# Patient Record
Sex: Male | Born: 1970
Health system: Southern US, Community
[De-identification: ages and names within clinical notes are randomized; demographics above are authoritative.]

## PROBLEM LIST (undated history)

## (undated) DIAGNOSIS — F419 Anxiety disorder, unspecified: Secondary | ICD-10-CM

## (undated) DIAGNOSIS — I2510 Atherosclerotic heart disease of native coronary artery without angina pectoris: Secondary | ICD-10-CM

## (undated) DIAGNOSIS — G459 Transient cerebral ischemic attack, unspecified: Secondary | ICD-10-CM

## (undated) DIAGNOSIS — K635 Polyp of colon: Secondary | ICD-10-CM

## (undated) DIAGNOSIS — I5189 Other ill-defined heart diseases: Secondary | ICD-10-CM

## (undated) DIAGNOSIS — I119 Hypertensive heart disease without heart failure: Secondary | ICD-10-CM

## (undated) DIAGNOSIS — I639 Cerebral infarction, unspecified: Secondary | ICD-10-CM

## (undated) DIAGNOSIS — G4730 Sleep apnea, unspecified: Secondary | ICD-10-CM

## (undated) DIAGNOSIS — F32A Depression, unspecified: Secondary | ICD-10-CM

## (undated) DIAGNOSIS — I82409 Acute embolism and thrombosis of unspecified deep veins of unspecified lower extremity: Secondary | ICD-10-CM

## (undated) DIAGNOSIS — I10 Essential (primary) hypertension: Secondary | ICD-10-CM

## (undated) DIAGNOSIS — E785 Hyperlipidemia, unspecified: Secondary | ICD-10-CM

## (undated) DIAGNOSIS — M1990 Unspecified osteoarthritis, unspecified site: Secondary | ICD-10-CM

## (undated) DIAGNOSIS — K219 Gastro-esophageal reflux disease without esophagitis: Secondary | ICD-10-CM

## (undated) HISTORY — PX: NASAL SINUS SURGERY: SHX719

## (undated) HISTORY — DX: Sleep apnea, unspecified: G47.30

## (undated) HISTORY — PX: SPINE SURGERY: SHX786

## (undated) HISTORY — DX: Polyp of colon: K63.5

## (undated) HISTORY — DX: Transient cerebral ischemic attack, unspecified: G45.9

## (undated) HISTORY — DX: Hypertensive heart disease without heart failure: I11.9

## (undated) HISTORY — DX: Hyperlipidemia, unspecified: E78.5

## (undated) HISTORY — DX: Other ill-defined heart diseases: I51.89

## (undated) HISTORY — PX: COLONOSCOPY: SHX174

## (undated) HISTORY — DX: Atherosclerotic heart disease of native coronary artery without angina pectoris: I25.10

## (undated) HISTORY — DX: Unspecified osteoarthritis, unspecified site: M19.90

## (undated) HISTORY — PX: JOINT REPLACEMENT: SHX530

## (undated) HISTORY — DX: Cerebral infarction, unspecified: I63.9

## (undated) HISTORY — DX: Anxiety disorder, unspecified: F41.9

## (undated) HISTORY — DX: Gastro-esophageal reflux disease without esophagitis: K21.9

## (undated) HISTORY — DX: Depression, unspecified: F32.A

---

## 1995-11-03 HISTORY — PX: MENISCUS REPAIR: SHX5179

## 2005-12-30 ENCOUNTER — Ambulatory Visit: Payer: Self-pay | Admitting: Unknown Physician Specialty

## 2008-01-04 ENCOUNTER — Ambulatory Visit: Payer: Self-pay | Admitting: Specialist

## 2008-01-07 ENCOUNTER — Ambulatory Visit: Payer: Self-pay | Admitting: Specialist

## 2009-02-18 HISTORY — PX: REPLACEMENT TOTAL KNEE: SUR1224

## 2009-03-31 ENCOUNTER — Ambulatory Visit: Payer: Self-pay | Admitting: Specialist

## 2009-04-05 ENCOUNTER — Ambulatory Visit: Payer: Self-pay | Admitting: Family Medicine

## 2010-07-09 HISTORY — PX: ARTHROPLASTY: SHX135

## 2011-06-14 DIAGNOSIS — M25569 Pain in unspecified knee: Secondary | ICD-10-CM | POA: Insufficient documentation

## 2011-06-14 DIAGNOSIS — M1991 Primary osteoarthritis, unspecified site: Secondary | ICD-10-CM | POA: Insufficient documentation

## 2011-07-18 DIAGNOSIS — Z9889 Other specified postprocedural states: Secondary | ICD-10-CM | POA: Insufficient documentation

## 2011-10-07 ENCOUNTER — Other Ambulatory Visit: Payer: Self-pay | Admitting: Family Medicine

## 2011-10-07 LAB — CK-MB: CK-MB: 1.8 ng/mL (ref 0.5–3.6)

## 2011-10-07 LAB — TROPONIN I: Troponin-I: <0.02 ng/mL

## 2011-10-09 ENCOUNTER — Ambulatory Visit: Payer: Self-pay | Admitting: Family Medicine

## 2012-04-30 ENCOUNTER — Ambulatory Visit: Payer: Self-pay | Admitting: Family Medicine

## 2012-10-12 ENCOUNTER — Ambulatory Visit: Payer: Self-pay | Admitting: Family Medicine

## 2013-02-18 HISTORY — PX: CERVICAL DISC SURGERY: SHX588

## 2013-04-26 ENCOUNTER — Ambulatory Visit: Payer: Self-pay | Admitting: Family Medicine

## 2013-04-26 LAB — CBC WITH DIFFERENTIAL/PLATELET
BASOS ABS: 0.1 10*3/uL (ref 0.0–0.1)
Basophil %: 1 %
EOS ABS: 0.2 10*3/uL (ref 0.0–0.7)
Eosinophil %: 3 %
HCT: 47.5 % (ref 40.0–52.0)
HGB: 15.6 g/dL (ref 13.0–18.0)
Lymphocyte #: 2.7 10*3/uL (ref 1.0–3.6)
Lymphocyte %: 33.9 %
MCH: 29.9 pg (ref 26.0–34.0)
MCHC: 32.9 g/dL (ref 32.0–36.0)
MCV: 91 fL (ref 80–100)
Monocyte #: 0.5 x10 3/mm (ref 0.2–1.0)
Monocyte %: 6.2 %
Neutrophil #: 4.4 10*3/uL (ref 1.4–6.5)
Neutrophil %: 55.9 %
PLATELETS: 307 10*3/uL (ref 150–440)
RBC: 5.23 10*6/uL (ref 4.40–5.90)
RDW: 13 % (ref 11.5–14.5)
WBC: 7.8 10*3/uL (ref 3.8–10.6)

## 2013-04-26 LAB — COMPREHENSIVE METABOLIC PANEL
AST: 48 U/L — AB (ref 15–37)
Albumin: 4.3 g/dL (ref 3.4–5.0)
Alkaline Phosphatase: 66 U/L
Anion Gap: 4 — ABNORMAL LOW (ref 7–16)
BUN: 15 mg/dL (ref 7–18)
Bilirubin,Total: 0.5 mg/dL (ref 0.2–1.0)
CO2: 28 mmol/L (ref 21–32)
CREATININE: 0.91 mg/dL (ref 0.60–1.30)
Calcium, Total: 9 mg/dL (ref 8.5–10.1)
Chloride: 104 mmol/L (ref 98–107)
EGFR (African American): >60
Glucose: 97 mg/dL (ref 65–99)
OSMOLALITY: 273 (ref 275–301)
Potassium: 4.2 mmol/L (ref 3.5–5.1)
SGPT (ALT): 66 U/L (ref 12–78)
Sodium: 136 mmol/L (ref 136–145)
Total Protein: 8.1 g/dL (ref 6.4–8.2)

## 2013-04-26 LAB — CK: CK, Total: 535 U/L — ABNORMAL HIGH

## 2013-04-26 LAB — SEDIMENTATION RATE: ERYTHROCYTE SED RATE: 2 mm/h (ref 0–15)

## 2013-08-03 ENCOUNTER — Other Ambulatory Visit: Payer: Self-pay | Admitting: Family Medicine

## 2013-08-03 LAB — CBC WITH DIFFERENTIAL/PLATELET
Basophil #: 0.1 10*3/uL (ref 0.0–0.1)
Basophil %: 0.9 %
EOS ABS: 0.2 10*3/uL (ref 0.0–0.7)
Eosinophil %: 2.5 %
HCT: 45.1 % (ref 40.0–52.0)
HGB: 15.2 g/dL (ref 13.0–18.0)
Lymphocyte #: 2.7 10*3/uL (ref 1.0–3.6)
Lymphocyte %: 33.9 %
MCH: 30.6 pg (ref 26.0–34.0)
MCHC: 33.7 g/dL (ref 32.0–36.0)
MCV: 91 fL (ref 80–100)
Monocyte #: 0.5 x10 3/mm (ref 0.2–1.0)
Monocyte %: 6.4 %
Neutrophil #: 4.5 10*3/uL (ref 1.4–6.5)
Neutrophil %: 56.3 %
Platelet: 320 10*3/uL (ref 150–440)
RBC: 4.96 10*6/uL (ref 4.40–5.90)
RDW: 12.9 % (ref 11.5–14.5)
WBC: 8 10*3/uL (ref 3.8–10.6)

## 2013-08-03 LAB — D-DIMER(ARMC): D-DIMER: 252 ng/mL

## 2013-08-03 LAB — TROPONIN I: Troponin-I: <0.02 ng/mL

## 2013-12-24 ENCOUNTER — Ambulatory Visit: Payer: Self-pay | Admitting: Family Medicine

## 2014-03-14 ENCOUNTER — Ambulatory Visit: Payer: Self-pay | Admitting: Family Medicine

## 2014-03-24 DIAGNOSIS — E782 Mixed hyperlipidemia: Secondary | ICD-10-CM | POA: Insufficient documentation

## 2014-03-24 DIAGNOSIS — R0681 Apnea, not elsewhere classified: Secondary | ICD-10-CM | POA: Insufficient documentation

## 2014-04-07 LAB — HM COLONOSCOPY

## 2014-04-08 ENCOUNTER — Ambulatory Visit: Payer: Self-pay | Admitting: Gastroenterology

## 2014-07-23 ENCOUNTER — Encounter (HOSPITAL_COMMUNITY): Payer: Self-pay | Admitting: Cardiology

## 2014-07-23 ENCOUNTER — Emergency Department: Payer: BLUE CROSS/BLUE SHIELD

## 2014-07-23 ENCOUNTER — Emergency Department
Admission: EM | Admit: 2014-07-23 | Discharge: 2014-07-23 | Disposition: A | Discharge status: Other Acute Inpatient Hospital | Payer: BLUE CROSS/BLUE SHIELD | Attending: Emergency Medicine | Admitting: Emergency Medicine

## 2014-07-23 ENCOUNTER — Encounter (HOSPITAL_COMMUNITY)
Admission: EM | Disposition: A | Discharge status: Home / Self Care | Payer: BLUE CROSS/BLUE SHIELD | Source: Other Acute Inpatient Hospital | Attending: Cardiovascular Disease

## 2014-07-23 ENCOUNTER — Inpatient Hospital Stay (HOSPITAL_COMMUNITY)
Admission: EM | Admit: 2014-07-23 | Discharge: 2014-07-26 | DRG: 251 | Disposition: A | Discharge status: Home / Self Care | Payer: BLUE CROSS/BLUE SHIELD | Source: Other Acute Inpatient Hospital | Attending: Cardiovascular Disease | Admitting: Cardiovascular Disease

## 2014-07-23 ENCOUNTER — Encounter: Payer: Self-pay | Admitting: Emergency Medicine

## 2014-07-23 DIAGNOSIS — Z6835 Body mass index (BMI) 35.0-35.9, adult: Secondary | ICD-10-CM

## 2014-07-23 DIAGNOSIS — Z86718 Personal history of other venous thrombosis and embolism: Secondary | ICD-10-CM | POA: Diagnosis not present

## 2014-07-23 DIAGNOSIS — G459 Transient cerebral ischemic attack, unspecified: Secondary | ICD-10-CM | POA: Insufficient documentation

## 2014-07-23 DIAGNOSIS — I213 ST elevation (STEMI) myocardial infarction of unspecified site: Secondary | ICD-10-CM | POA: Insufficient documentation

## 2014-07-23 DIAGNOSIS — I2111 ST elevation (STEMI) myocardial infarction involving right coronary artery: Secondary | ICD-10-CM

## 2014-07-23 DIAGNOSIS — I249 Acute ischemic heart disease, unspecified: Secondary | ICD-10-CM | POA: Diagnosis present

## 2014-07-23 DIAGNOSIS — I10 Essential (primary) hypertension: Secondary | ICD-10-CM | POA: Diagnosis present

## 2014-07-23 DIAGNOSIS — R0789 Other chest pain: Secondary | ICD-10-CM | POA: Diagnosis present

## 2014-07-23 DIAGNOSIS — I2510 Atherosclerotic heart disease of native coronary artery without angina pectoris: Secondary | ICD-10-CM | POA: Diagnosis present

## 2014-07-23 DIAGNOSIS — Z87891 Personal history of nicotine dependence: Secondary | ICD-10-CM

## 2014-07-23 DIAGNOSIS — I2119 ST elevation (STEMI) myocardial infarction involving other coronary artery of inferior wall: Secondary | ICD-10-CM | POA: Diagnosis present

## 2014-07-23 DIAGNOSIS — E669 Obesity, unspecified: Secondary | ICD-10-CM | POA: Diagnosis present

## 2014-07-23 DIAGNOSIS — E785 Hyperlipidemia, unspecified: Secondary | ICD-10-CM | POA: Diagnosis present

## 2014-07-23 DIAGNOSIS — R079 Chest pain, unspecified: Secondary | ICD-10-CM | POA: Diagnosis present

## 2014-07-23 HISTORY — PX: CARDIAC CATHETERIZATION: SHX172

## 2014-07-23 HISTORY — DX: Transient cerebral ischemic attack, unspecified: G45.9

## 2014-07-23 HISTORY — DX: Acute embolism and thrombosis of unspecified deep veins of unspecified lower extremity: I82.409

## 2014-07-23 HISTORY — DX: ST elevation (STEMI) myocardial infarction involving right coronary artery: I21.11

## 2014-07-23 LAB — BASIC METABOLIC PANEL
Anion gap: 8 (ref 5–15)
BUN: 19 mg/dL (ref 6–20)
CO2: 25 mmol/L (ref 22–32)
CREATININE: 1.36 mg/dL — AB (ref 0.61–1.24)
Calcium: 8.7 mg/dL — ABNORMAL LOW (ref 8.9–10.3)
Chloride: 107 mmol/L (ref 101–111)
GFR calc Af Amer: >60 mL/min (ref >60)
GFR calc non Af Amer: >60 mL/min (ref >60)
Glucose, Bld: 148 mg/dL — ABNORMAL HIGH (ref 65–99)
Potassium: 4.2 mmol/L (ref 3.5–5.1)
SODIUM: 140 mmol/L (ref 135–145)

## 2014-07-23 LAB — PROTIME-INR
INR: 1.04
PROTHROMBIN TIME: 13.8 s (ref 11.4–15.0)

## 2014-07-23 LAB — CBC
HCT: 42.3 % (ref 40.0–52.0)
HCT: 38.8 % — ABNORMAL LOW (ref 39.0–52.0)
HEMOGLOBIN: 13.3 g/dL (ref 13.0–17.0)
Hemoglobin: 13.9 g/dL (ref 13.0–18.0)
MCH: 29.7 pg (ref 26.0–34.0)
MCH: 30.1 pg (ref 26.0–34.0)
MCHC: 32.9 g/dL (ref 32.0–36.0)
MCHC: 34.3 g/dL (ref 30.0–36.0)
MCV: 90.3 fL (ref 80.0–100.0)
MCV: 87.8 fL (ref 78.0–100.0)
PLATELETS: 294 10*3/uL (ref 150–440)
Platelets: 293 10*3/uL (ref 150–400)
RBC: 4.68 MIL/uL (ref 4.40–5.90)
RBC: 4.42 MIL/uL (ref 4.22–5.81)
RDW: 13 % (ref 11.5–14.5)
RDW: 12.7 % (ref 11.5–15.5)
WBC: 16.3 10*3/uL — AB (ref 3.8–10.6)
WBC: 14.2 10*3/uL — ABNORMAL HIGH (ref 4.0–10.5)

## 2014-07-23 LAB — TROPONIN I
Troponin I: 0.06 ng/mL — ABNORMAL HIGH (ref <0.031)
Troponin I: 2.38 ng/mL (ref <0.031)

## 2014-07-23 LAB — MRSA PCR SCREENING: MRSA BY PCR: NEGATIVE

## 2014-07-23 LAB — POCT ACTIVATED CLOTTING TIME: ACTIVATED CLOTTING TIME: 104 s

## 2014-07-23 SURGERY — LEFT HEART CATH
Anesthesia: LOCAL

## 2014-07-23 MED ORDER — IOHEXOL 350 MG/ML SOLN
INTRAVENOUS | Status: DC | PRN
  Administered 2014-07-23: 200 mL via INTRA_ARTERIAL

## 2014-07-23 MED ORDER — NITROGLYCERIN 1 MG/10 ML FOR IR/CATH LAB
INTRA_ARTERIAL | Status: AC
  Filled 2014-07-23: qty 10

## 2014-07-23 MED ORDER — ONDANSETRON HCL 4 MG/2ML IJ SOLN: 4.0000 mg | Freq: Four times a day (QID) | INTRAMUSCULAR | Status: DC | PRN

## 2014-07-23 MED ORDER — TICAGRELOR 90 MG PO TABS
ORAL_TABLET | ORAL | Status: AC
  Filled 2014-07-23: qty 1

## 2014-07-23 MED ORDER — SODIUM CHLORIDE 0.9 % IV SOLN: 250.0000 mL | INTRAVENOUS | Status: DC | PRN

## 2014-07-23 MED ORDER — TIROFIBAN HCL IV 5 MG/100ML
0.1500 ug/kg/min | INTRAVENOUS | Status: AC
  Administered 2014-07-23 – 2014-07-24 (×4): 0.15 ug/kg/min via INTRAVENOUS
  Filled 2014-07-23 (×4): qty 100

## 2014-07-23 MED ORDER — ATORVASTATIN CALCIUM 80 MG PO TABS
80.0000 mg | ORAL_TABLET | Freq: Every day | ORAL | Status: DC
  Filled 2014-07-23: qty 1

## 2014-07-23 MED ORDER — METOPROLOL TARTRATE 12.5 MG HALF TABLET
12.5000 mg | ORAL_TABLET | Freq: Two times a day (BID) | ORAL | Status: DC
  Administered 2014-07-23 – 2014-07-26 (×6): 12.5 mg via ORAL
  Filled 2014-07-23 (×7): qty 1

## 2014-07-23 MED ORDER — ASPIRIN EC 81 MG PO TBEC
81.0000 mg | DELAYED_RELEASE_TABLET | Freq: Every day | ORAL | Status: DC
  Administered 2014-07-24 – 2014-07-26 (×3): 81 mg via ORAL
  Filled 2014-07-23 (×3): qty 1

## 2014-07-23 MED ORDER — HEPARIN (PORCINE) IN NACL 2-0.9 UNIT/ML-% IJ SOLN
INTRAMUSCULAR | Status: AC
  Filled 2014-07-23: qty 1000

## 2014-07-23 MED ORDER — TICAGRELOR 90 MG PO TABS
ORAL_TABLET | ORAL | Status: DC | PRN
  Administered 2014-07-23: 180 mg via ORAL

## 2014-07-23 MED ORDER — NITROGLYCERIN 1 MG/10 ML FOR IR/CATH LAB
INTRA_ARTERIAL | Status: DC | PRN
  Administered 2014-07-23 (×3): 200 ug via INTRACORONARY

## 2014-07-23 MED ORDER — LIDOCAINE HCL (PF) 1 % IJ SOLN
INTRAMUSCULAR | Status: AC
  Filled 2014-07-23: qty 30

## 2014-07-23 MED ORDER — SODIUM CHLORIDE 0.9 % IV SOLN
INTRAVENOUS | Status: AC
  Administered 2014-07-23 (×2): via INTRAVENOUS

## 2014-07-23 MED ORDER — MIDAZOLAM HCL 2 MG/2ML IJ SOLN
INTRAMUSCULAR | Status: AC
  Filled 2014-07-23: qty 2

## 2014-07-23 MED ORDER — SODIUM CHLORIDE 0.9 % IJ SOLN
3.0000 mL | Freq: Two times a day (BID) | INTRAMUSCULAR | Status: DC
  Administered 2014-07-24 – 2014-07-26 (×4): 3 mL via INTRAVENOUS

## 2014-07-23 MED ORDER — ASPIRIN EC 81 MG PO TBEC
81.0000 mg | DELAYED_RELEASE_TABLET | Freq: Every day | ORAL | Status: DC
  Filled 2014-07-23: qty 1

## 2014-07-23 MED ORDER — SODIUM CHLORIDE 0.9 % IJ SOLN: 3.0000 mL | INTRAMUSCULAR | Status: DC | PRN

## 2014-07-23 MED ORDER — ATORVASTATIN CALCIUM 80 MG PO TABS
80.0000 mg | ORAL_TABLET | Freq: Every day | ORAL | Status: DC
  Administered 2014-07-24 – 2014-07-25 (×2): 80 mg via ORAL
  Filled 2014-07-23 (×3): qty 1

## 2014-07-23 MED ORDER — MIDAZOLAM HCL 2 MG/2ML IJ SOLN
INTRAMUSCULAR | Status: DC | PRN
  Administered 2014-07-23: 2 mg via INTRAVENOUS

## 2014-07-23 MED ORDER — BIVALIRUDIN 250 MG IV SOLR
250.0000 mg | INTRAVENOUS | Status: DC | PRN
  Administered 2014-07-23: 1.75 mg/kg/h via INTRAVENOUS

## 2014-07-23 MED ORDER — ATROPINE SULFATE 0.1 MG/ML IJ SOLN
INTRAMUSCULAR | Status: AC
  Filled 2014-07-23: qty 10

## 2014-07-23 MED ORDER — TICAGRELOR 90 MG PO TABS
90.0000 mg | ORAL_TABLET | Freq: Two times a day (BID) | ORAL | Status: DC
  Administered 2014-07-24 – 2014-07-26 (×5): 90 mg via ORAL
  Filled 2014-07-23 (×7): qty 1

## 2014-07-23 MED ORDER — BIVALIRUDIN BOLUS VIA INFUSION - CUPID
INTRAVENOUS | Status: DC | PRN
  Administered 2014-07-23: 86.775 mg via INTRAVENOUS

## 2014-07-23 MED ORDER — TIROFIBAN HCL IV 12.5 MG/250 ML
INTRAVENOUS | Status: AC
  Filled 2014-07-23: qty 250

## 2014-07-23 MED ORDER — TIROFIBAN HCL IV 12.5 MG/250 ML
INTRAVENOUS | Status: DC | PRN
  Administered 2014-07-23: 25 ug/kg/min via INTRAVENOUS

## 2014-07-23 MED ORDER — BIVALIRUDIN 250 MG IV SOLR
INTRAVENOUS | Status: AC
  Filled 2014-07-23: qty 250

## 2014-07-23 MED ORDER — SODIUM CHLORIDE 0.9 % IV SOLN: 0.2500 mg/kg/h | INTRAVENOUS | Status: AC

## 2014-07-23 MED ORDER — SODIUM CHLORIDE 0.9 % IV SOLN
0.2500 mg/kg/h | INTRAVENOUS | Status: DC
  Filled 2014-07-23: qty 250

## 2014-07-23 MED ORDER — NITROGLYCERIN 0.4 MG SL SUBL
SUBLINGUAL_TABLET | SUBLINGUAL | Status: AC
  Filled 2014-07-23: qty 3

## 2014-07-23 MED ORDER — FENTANYL CITRATE (PF) 100 MCG/2ML IJ SOLN
INTRAMUSCULAR | Status: DC | PRN
  Administered 2014-07-23 (×2): 25 ug via INTRAVENOUS

## 2014-07-23 MED ORDER — SODIUM CHLORIDE 0.9 % IV SOLN
1.7500 mg/kg/h | INTRAVENOUS | Status: DC
  Filled 2014-07-23: qty 250

## 2014-07-23 MED ORDER — FENTANYL CITRATE (PF) 100 MCG/2ML IJ SOLN
INTRAMUSCULAR | Status: AC
  Filled 2014-07-23: qty 2

## 2014-07-23 MED ORDER — NITROGLYCERIN 0.4 MG SL SUBL: 0.4000 mg | SUBLINGUAL_TABLET | SUBLINGUAL | Status: DC | PRN

## 2014-07-23 MED ORDER — ACETAMINOPHEN 325 MG PO TABS
650.0000 mg | ORAL_TABLET | ORAL | Status: DC | PRN
  Administered 2014-07-23: 650 mg via ORAL
  Filled 2014-07-23: qty 2

## 2014-07-23 MED ORDER — NITROGLYCERIN IN D5W 200-5 MCG/ML-% IV SOLN
INTRAVENOUS | Status: AC
  Filled 2014-07-23: qty 250

## 2014-07-23 SURGICAL SUPPLY — 22 items
BALLN EMERGE MR 3.5X12 (BALLOONS) ×3
BALLN TREK RX 2.5X12 (BALLOONS) ×3
BALLOON EMERGE MR 3.5X12 (BALLOONS) ×1 IMPLANT
BALLOON TREK RX 2.5X12 (BALLOONS) ×1 IMPLANT
CATH EXTRAC PRONTO 5.5F 138CM (CATHETERS) ×3 IMPLANT
CATH INFINITI 5FR ANG PIGTAIL (CATHETERS) IMPLANT
CATH INFINITI 5FR MULTPACK ANG (CATHETERS) ×3 IMPLANT
CATH OPTITORQUE TIG 4.0 5F (CATHETERS) IMPLANT
DEVICE RAD COMP TR BAND LRG (VASCULAR PRODUCTS) IMPLANT
GLIDESHEATH SLEND A-KIT 6F 22G (SHEATH) IMPLANT
GUIDE CATH RUNWAY 6FR FR4 (CATHETERS) ×3 IMPLANT
KIT ENCORE 26 ADVANTAGE (KITS) ×3 IMPLANT
KIT HEART LEFT (KITS) ×3 IMPLANT
PACK CARDIAC CATHETERIZATION (CUSTOM PROCEDURE TRAY) ×3 IMPLANT
SHEATH PINNACLE 5F 10CM (SHEATH) ×3 IMPLANT
SHEATH PINNACLE 6F 10CM (SHEATH) ×3 IMPLANT
SYR MEDRAD MARK V 150ML (SYRINGE) ×3 IMPLANT
TRANSDUCER W/STOPCOCK (MISCELLANEOUS) ×3 IMPLANT
TUBING CIL FLEX 10 FLL-RA (TUBING) ×3 IMPLANT
WIRE ASAHI PROWATER 180CM (WIRE) ×3 IMPLANT
WIRE EMERALD 3MM-J .035X150CM (WIRE) ×3 IMPLANT
WIRE SAFE-T 1.5MM-J .035X260CM (WIRE) IMPLANT

## 2014-07-23 NOTE — ED Provider Notes (Signed)
[facility] Emergency Department Provider Note  ____________________________________________  Time seen: Approximately 0865 PM  I have reviewed the triage vital signs and the nursing notes.   HISTORY  Chief Complaint Chest Pain    HPI Eduardo Poole is a 44 y.o. male who had an episode of chest pain which started while pressure washing his home. Occurred at about 1:30 or 2 PM. Patient states he was pressure washing out and developed a sudden onset of fairly severe pain in his middle chest which is described as a pressure sensation that radiated down into his right arm. He was very nauseated at that time. He went into the house and sat down to use the toilet.He was getting up from the toilet when he also noticed that he felt a little weak in his right leg and his right arm was weak and numb. He said he may have felt a little bit funny with his speech, but the numbness weakness and speech symptoms all resolved within 10-15 minutes. He is still having ongoing chest pain.  The patient was given aspirin 324 with EMS, nitroglycerin sublingual, and nitroglycerin paste which made his pain significantly improved down to about a "2" in the left chest.  He denies having any history of known stroke or heart disease.  He did not faint or pass out.  History reviewed. No pertinent past medical history.  There are no active problems to display for this patient.   Past Surgical History  Procedure Laterality Date  . Joint replacement      No current outpatient prescriptions on file.  Allergies Review of patient's allergies indicates no known allergies.  History reviewed. No pertinent family history.  Social History History  Substance Use Topics  . Smoking status: Never Smoker   . Smokeless tobacco: Former Systems developer  . Alcohol Use: No    Review of Systems Constitutional: No fever/chills Eyes: No visual changes. ENT: No sore throat. Cardiovascular: See history of  present illness Respiratory: Denies shortness of breath. Gastrointestinal: No abdominal pain.  Nausea. No diarrhea.  No constipation. Genitourinary: Negative for dysuria. Musculoskeletal: Negative for back pain. Skin: Negative for rash. Neurological: Negative for headaches, focal weakness or numbness at this time, though his right arm and right leg were weak for about 10 minutes.  10-point ROS otherwise negative.  ____________________________________________   PHYSICAL EXAM:  VITAL SIGNS: ED Triage Vitals  Enc Vitals Group     BP 07/23/14 1459 144/96 mmHg     Pulse Rate 07/23/14 1459 101     Resp --      Temp 07/23/14 1459 99 F (37.2 C)     Temp Source 07/23/14 1459 Oral     SpO2 07/23/14 1459 95 %     Weight 07/23/14 1459 255 lb (115.667 kg)     Height 07/23/14 1459 5\' 10"  (1.778 m)     Head Cir --      Peak Flow --      Pain Score 07/23/14 1501 2     Pain Loc --      Pain Edu? --      Excl. in Downs? --     Constitutional: Alert and oriented. Pale and diaphoretic. Eyes: Conjunctivae are normal. PERRL. EOMI. Head: Atraumatic. Nose: No congestion/rhinnorhea. Mouth/Throat: Mucous membranes are moist.  Oropharynx non-erythematous. Neck: No stridor.   Cardiovascular: Normal rate, regular rhythm. Grossly normal heart sounds.  Good peripheral circulation. Respiratory: Normal respiratory effort.  No retractions. Lungs CTAB. Gastrointestinal: Soft and  nontender. No distention. No abdominal bruits. No CVA tenderness. Musculoskeletal: No lower extremity tenderness nor edema.  No joint effusions. Neurologic:  Normal speech and language. No gross focal neurologic deficits are appreciated. Speech is normal. No gait instability. Patient has normal speech. There is no pronator drift. There is no cranial nerve deficit. He is able to lift and move all extremities without weakness. Skin:  Skin is warm, dry and intact. No rash noted. Psychiatric: Mood and affect are normal. Speech and  behavior are normal.  ____________________________________________   LABS (all labs ordered are listed, but only abnormal results are displayed)  Labs Reviewed  CBC - Abnormal; Notable for the following:    WBC 16.3 (*)    All other components within normal limits  PROTIME-INR  BASIC METABOLIC PANEL  TROPONIN I   ____________________________________________  EKG  EKG at 1503 hrs. reviewed and interpreted by me There is approximately 1 mm of ST elevation in leads 2 and leads aVF. There is possible slight ST elevation in lead 3. Sinus rhythm. Ventricular rate 96. QRS 96 QTc 462. This EKG is concerning for acute ST elevation MI. ____________________________________________  RADIOLOGY  CLINICAL DATA: Chest pain. Right-sided weakness.  EXAM: CT HEAD WITHOUT CONTRAST  TECHNIQUE: Contiguous axial images were obtained from the base of the skull through the vertex without intravenous contrast.  COMPARISON: 04/30/2012  FINDINGS: No mass lesion. No midline shift. No acute hemorrhage or hematoma. No extra-axial fluid collections. No evidence of acute infarction. Brain parenchyma is normal. Slight asymmetry of the lateral ventricles is felt to be congenital and stable. No osseous abnormality.  IMPRESSION: Normal CT scan. ____________________________________________   PROCEDURES  Procedure(s) performed: None  Critical Care performed: Yes, see critical care note(s)  CRITICAL CARE Performed by: Delman Kitten   Total critical care time: 40  Critical care time was exclusive of separately billable procedures and treating other patients.  Critical care was necessary to treat or prevent imminent or life-threatening deterioration.  Critical care was time spent personally by me on the following activities: development of treatment plan with patient and/or surrogate as well as nursing, discussions with consultants, evaluation of patient's response to treatment, examination of  patient, obtaining history from patient or surrogate, ordering and performing treatments and interventions, ordering and review of laboratory studies, ordering and review of radiographic studies, pulse oximetry and re-evaluation of patient's condition.  ____________________________________________   INITIAL IMPRESSION / ASSESSMENT AND PLAN / ED COURSE  Pertinent labs & imaging results that were available during my care of the patient were reviewed by me and considered in my medical decision making (see chart for details).  His patient's presentation is primarily concerning for possible acute cardiac event including STEMI. In addition, the patient did have a brief episode of weakness in the right arm and right leg with speech changes. I am concerned that this represents an acute cardiac event. I have discussed with acute interventional cardiology we will transfer the patient for emergent evaluation at [facility] catheter lab. However, the patient also had acute neurologic event that lasted less than 15 minutes which is concerning for possible transient ischemic event or intracranial hemorrhage. I also discussed this with Dr. Claiborne Billings, because of this we did delay care and obtain a CT head and thereafter initiated transfer to [facility] while awaiting read on the CT. I discussed with Dr. Claiborne Billings, and based on the time acuity and need for possible acute interventional cardiology evaluation, we did not start heparin bolus as we want  to assure that the CT does not show any intracranial bleeding prior to any anticoagulation.  Discussed with the patient and his family and he understands and agrees with the recommendation to transfer to [facility] for further evaluation. Risks of transfer were discussed with the patient.  There is unclear if this could have an associated cerebrovascular accident, patient's symptoms would have markedly improved based on the fact that he had significant difficulty moving his arm  leg and speech change. He presently has no speech changes and states that he is moving his arms and legs well, and indeed on exam he appears neurologically intact . I feel that this would likely not make him a TPA candidate in regards to strokelike symptoms, but he may still need interventional cardiology given his EKG abnormalities.  At this point, we still do not know for sure that the patient did not have some type of a complicated or mixed picture of acute coronary syndrome and/or CVA, but his neurologic symptoms have  resolved now CT head has been completed, and he will be under the care of interventional cardiology based on his EKG, ongoing chest pain, and concerns for ACS.  ----------------------------------------- 3:48 PM on 07/23/2014 -----------------------------------------  And to drink to await CT head result. I have called to gr for radiology to follow-up.   CT head is read as normal. I discussed labs and updated Dr. Claiborne Billings at 4 PM. We also discussed differential, that this could represent acute coronary syndrome, less likely acute cerebrovascular accident, or other etiology such as dissection. ____________________________________________   FINAL CLINICAL IMPRESSION(S) / ED DIAGNOSES  Final diagnoses:  ST elevation myocardial infarction (STEMI), unspecified artery  Transient cerebral ischemia, unspecified transient cerebral ischemia type      Delman Kitten, MD 07/23/14 1606

## 2014-07-23 NOTE — H&P (Signed)
Patient ID: ALFORD GAMERO MRN: [id number], DOB/AGE: January 16, 1971   Admit date: 07/23/2014   Primary Physician: Lelon Huh, MD Primary Cardiologist: New (Dr. Claiborne Billings)  Pt. Profile:  44 y/o male with no prior cardiac history admitted for ACS and TIA symptoms.  Problem List  No past medical history on file.  Past Surgical History  Procedure Laterality Date  . Joint replacement       Allergies  No Known Allergies  HPI  The patient is a 44 y/o male with no prior cardiac history who was transferred today from [facility] for ACS. He denies any past history of HTN, HLD or DM. He has a h/o DVT 8 years ago and was treated with Coumadin.  He has a remote history of smokeless tobacco use. He was in his usual state of health until around 1PM today when he developed severe substernal chest pressure while pressure-washing his house. The pain did not go away with rest, prompting him to go to [place]. There, EKG showed mild inferior ST elevations. Troponin was mildly elevated at 0.06. Also while in the ER, he was noted to have transient TIA like symptoms with brief slurring of speech and unilateral upper extremity numbness/ weakness. This only lasted several min. STAT CT of head showed no acute CVA. He was transported urgently to [facility] for further care and emergent [facility]. On arrival to [facility], he was still with chest discomfort. No further neurological deficits. Visualization of the left coronary system showed widely patent vessels. The RCA appeared to have ~30% mid stenosis with good flow but with evidence of clot distally. It was felt that perhaps he had palque that had rupture and that his clot had resolved after dose of Angiomax. There was also question of possible embolization of clot to cerebral vasculature and that he suffered a possible TIA. Left ventriculography showed normal LVF and normal appearing aortic anatomy. He was continued on Angiomax and started on ASA and Effient. He is now CP free.     Home Medications  Prior to Admission medications   Not on File    Family History  Family History  Problem Relation Age of Onset  . Hypertension Mother     Social History  History   Social History  . Marital Status: Married    Spouse Name: N/A  . Number of Children: N/A  . Years of Education: N/A   Occupational History  . Not on file.   Social History Main Topics  . Smoking status: Never Smoker   . Smokeless tobacco: Former Systems developer  . Alcohol Use: No  . Drug Use: No  . Sexual Activity: Not on file   Other Topics Concern  . Not on file   Social History Narrative     Review of Systems General:  No chills, fever, night sweats or weight changes.  Cardiovascular:  No chest pain, dyspnea on exertion, edema, orthopnea, palpitations, paroxysmal nocturnal dyspnea. Dermatological: No rash, lesions/masses Respiratory: No cough, dyspnea Urologic: No hematuria, dysuria Abdominal:   No nausea, vomiting, diarrhea, bright red blood per rectum, melena, or hematemesis Neurologic:  No visual changes, wkns, changes in mental status. All other systems reviewed and are otherwise negative except as noted above.  Physical Exam  Blood pressure 135/91, pulse 0, resp. rate 0, SpO2 0 %.  General: Pleasant, NAD Psych: Normal affect. Neuro: Alert and oriented X 3. Moves all extremities spontaneously. HEENT: Normal  Neck: Supple without bruits or JVD. Lungs:  Resp regular and unlabored,  CTA. Heart: RRR no s3, s4, or murmurs. Abdomen: Soft, non-tender, non-distended, BS + x 4.  Extremities: No clubbing, cyanosis or edema. DP/PT/Radials 2+ and equal bilaterally.  Labs  Troponin (Point of Care Test) No results for input(s): TROPIPOC in the last 72 hours.  Recent Labs  07/23/14 1501  TROPONINI 0.06*   Lab Results  Component Value Date   WBC 16.3* 07/23/2014   HGB 13.9 07/23/2014   HCT 42.3 07/23/2014   MCV 90.3 07/23/2014   PLT 294 07/23/2014     Recent Labs Lab  07/23/14 1501  NA 140  K 4.2  CL 107  CO2 25  BUN 19  CREATININE 1.36*  CALCIUM 8.7*  GLUCOSE 148*   No results found for: CHOL, HDL, LDLCALC, TRIG No results found for: DDIMER   Radiology/Studies  Ct Head Wo Contrast  07/23/2014   CLINICAL DATA:  Chest pain.  Right-sided weakness.  EXAM: CT HEAD WITHOUT CONTRAST  TECHNIQUE: Contiguous axial images were obtained from the base of the skull through the vertex without intravenous contrast.  COMPARISON:  04/30/2012  FINDINGS: No mass lesion. No midline shift. No acute hemorrhage or hematoma. No extra-axial fluid collections. No evidence of acute infarction. Brain parenchyma is normal. Slight asymmetry of the lateral ventricles is felt to be congenital and stable. No osseous abnormality.  IMPRESSION: Normal CT scan.   Electronically Signed   By: Lorriane Shire M.D.   On: 07/23/2014 15:54    ECG  approximately 1 mm of ST elevation in leads 2 and leads aVF. There is possible slight ST elevation in lead 3. Sinus rhythm. Ventricular rate 96. QRS 96 QTc 462. This EKG is concerning for acute ST elevation MI.    ASSESSMENT AND PLAN  Active Problems:   ST elevation myocardial infarction involving right coronary artery  1. ACS: LHC suggest likely plaque rupture in RCA with resolution of clot after administration of Angiomax. No flow limiting lesions and no indication for PCI. The left coronary system is widely patent.  LVF is normal. Will admit to the CCU. Will continue Angiomax and Aggrastat. Will also start ASA, Effient, low dose BB and high dose statin. Will check FLP in the am. Will also check Hgb A1c. Monitor on telemetry.  2. ? TIA: There is concern for possible embolization of clot that may explain his TIA like symptoms earlier. He is now asymptomatic. Will check a 2D echo to look for cardiac sources.   Signed, Lyda Jester, PA-C 07/23/2014, 5:38 PM  Patient seen and examined. Agree with assessment and plan.  Mr. Jorgensen is a  44 year old white male who was transferred from [facility] emergency room with a diagnosis of inferior STEMI.  He denies any known prior cardiac disease.  Today while power washing his house, he developed chest tightness and pressure.  He went into the house.  His chest pain persisted.  He also developed some transient speech impediment and right-sided weakness.  In the emergency room at [facility],  ECG showed mild inferior ST elevation.  A subsequent ECG showed progression.  The patient continued to experience chest tightness.  A CT scan was done which did not show any acute bleed or stroke.  He is now brought directly to the catheterization laboratory for emergent cardiac catheterization.  The patient has remote history of a prior DVT 8 years ago after he developed some injury to his calf muscle.  There is remote history of smokeless tobacco but he quit over 10 years ago.  Upon arrival to the catheterization laboratory.  He is still experiencing 5 out of 10 chest pain.  Troponin is mildly positive.  Plan emergent cardiac catheterization and possible intervention if necessary.   Troy Sine, MD, [facility] 07/23/2014 6:05 PM

## 2014-07-23 NOTE — Progress Notes (Signed)
ANTICOAGULATION CONSULT NOTE - Follow Up Consult  Pharmacy Consult for Aggrastat Indication: chest pain/ACS  No Known Allergies  Patient Measurements:    Weight 115.7 kg  Vital Signs: Temp: 99 F (37.2 C) (06/04 1459) Temp Source: Oral (06/04 1459) BP: 135/91 mmHg (06/04 1707) Pulse Rate: 0 (06/04 1737)  Labs:  Recent Labs  07/23/14 1501  HGB 13.9  HCT 42.3  PLT 294  LABPROT 13.8  INR 1.04  CREATININE 1.36*  TROPONINI 0.06*    Estimated Creatinine Clearance: 88.3 mL/min (by C-G formula based on Cr of 1.36).   Medications:  Infusions:  . sodium chloride    . tirofiban      Assessment: 44 year old male admitted with STEMI s/p cath.  He is to continue Aggrastat x 18 hours post-cath.  His baseline platelet count is 294 and no elevated risk of bleeding is noted.  Goal of Therapy:  Monitor platelets by anticoagulation protocol: Yes   Plan:  Aggrastat 0.15 mcg/kg/min x 18 hours. Check CBC in 6 hours to assess platelet count Daily CBC while on Aggrastat Monitor for bleeding complications.  Legrand Como, Pharm.D., BCPS, AAHIVP Clinical Pharmacist Phone: [phone] or [phone] 07/23/2014, 5:53 PM

## 2014-07-23 NOTE — ED Notes (Signed)
Pt to ct - pt had 4 baby asa pta with 3 nitro which brought pain down to 2/10

## 2014-07-23 NOTE — ED Notes (Signed)
Was power washing house x 4 hrs and began to have chest pain radiating to rt arm and nausea

## 2014-07-23 NOTE — ED Notes (Signed)
All paperwork printed and transfer forms signed. Pt able to move all extremities but states it feels weak in his right hand

## 2014-07-23 NOTE — Progress Notes (Signed)
Act 104. Pulled arterial sheath with Lurline Hare. Held pressure for 20 minutes, pressure dressing applied. No bruising or hematoma developed. Pt educated on post sheath removal precautions. Will continue to monitor.

## 2014-07-23 NOTE — ED Notes (Signed)
Attempted to call report to cath lab at [phone] - no answer.

## 2014-07-24 ENCOUNTER — Encounter (HOSPITAL_COMMUNITY): Payer: Self-pay

## 2014-07-24 DIAGNOSIS — E669 Obesity, unspecified: Secondary | ICD-10-CM

## 2014-07-24 DIAGNOSIS — E785 Hyperlipidemia, unspecified: Secondary | ICD-10-CM

## 2014-07-24 LAB — BASIC METABOLIC PANEL
Anion gap: 9 (ref 5–15)
Anion gap: 6 (ref 5–15)
BUN: 11 mg/dL (ref 6–20)
BUN: 10 mg/dL (ref 6–20)
CALCIUM: 8.5 mg/dL — AB (ref 8.9–10.3)
CALCIUM: 8.6 mg/dL — AB (ref 8.9–10.3)
CHLORIDE: 106 mmol/L (ref 101–111)
CO2: 24 mmol/L (ref 22–32)
CO2: 24 mmol/L (ref 22–32)
CREATININE: 1.03 mg/dL (ref 0.61–1.24)
Chloride: 104 mmol/L (ref 101–111)
Creatinine, Ser: 0.95 mg/dL (ref 0.61–1.24)
GFR calc Af Amer: >60 mL/min (ref >60)
GFR calc Af Amer: >60 mL/min (ref >60)
GFR calc non Af Amer: >60 mL/min (ref >60)
Glucose, Bld: 117 mg/dL — ABNORMAL HIGH (ref 65–99)
Glucose, Bld: 131 mg/dL — ABNORMAL HIGH (ref 65–99)
POTASSIUM: 3.9 mmol/L (ref 3.5–5.1)
Potassium: 3.7 mmol/L (ref 3.5–5.1)
Sodium: 137 mmol/L (ref 135–145)
Sodium: 136 mmol/L (ref 135–145)

## 2014-07-24 LAB — LIPID PANEL
Cholesterol: 180 mg/dL (ref 0–200)
HDL: 25 mg/dL — ABNORMAL LOW (ref >40)
LDL Cholesterol: 121 mg/dL — ABNORMAL HIGH (ref 0–99)
Total CHOL/HDL Ratio: 7.2 RATIO
Triglycerides: 170 mg/dL — ABNORMAL HIGH (ref <150)
VLDL: 34 mg/dL (ref 0–40)

## 2014-07-24 LAB — CBC
HEMATOCRIT: 39.5 % (ref 39.0–52.0)
HEMATOCRIT: 40.1 % (ref 39.0–52.0)
Hemoglobin: 13.4 g/dL (ref 13.0–17.0)
Hemoglobin: 13.9 g/dL (ref 13.0–17.0)
MCH: 30 pg (ref 26.0–34.0)
MCH: 30.7 pg (ref 26.0–34.0)
MCHC: 33.9 g/dL (ref 30.0–36.0)
MCHC: 34.7 g/dL (ref 30.0–36.0)
MCV: 88.6 fL (ref 78.0–100.0)
MCV: 88.5 fL (ref 78.0–100.0)
PLATELETS: 290 10*3/uL (ref 150–400)
Platelets: 285 10*3/uL (ref 150–400)
RBC: 4.46 MIL/uL (ref 4.22–5.81)
RBC: 4.53 MIL/uL (ref 4.22–5.81)
RDW: 12.9 % (ref 11.5–15.5)
RDW: 12.9 % (ref 11.5–15.5)
WBC: 14 10*3/uL — ABNORMAL HIGH (ref 4.0–10.5)
WBC: 15.3 10*3/uL — ABNORMAL HIGH (ref 4.0–10.5)

## 2014-07-24 LAB — TROPONIN I
Troponin I: 10.77 ng/mL (ref <0.031)
Troponin I: 12.04 ng/mL (ref <0.031)

## 2014-07-24 NOTE — Progress Notes (Signed)
Subjective:  No further CP. No further full body weakness. During episode after pressure washing, felt like he could not move both feet or hands bilaterally. CT head normal. No symptoms now.   No fevers at home.   Had stress test by cardiologist in the recent past for atypical CP. Endoscopy as well (hiatal hernia)  Objective:  Vital Signs in the last 24 hours: Temp:  [97.9 F (36.6 C)-99 F (37.2 C)] 98.5 F (36.9 C) (06/05 0732) Pulse Rate:  [0-170] 82 (06/05 0732) Resp:  [0-31] 20 (06/05 0732) BP: (122-183)/(71-117) 142/88 mmHg (06/05 0732) SpO2:  [0 %-100 %] 99 % (06/05 0732) Arterial Line BP: (161-184)/(88-106) 179/106 mmHg (06/04 2225) Weight:  [255 lb (115.667 kg)] 255 lb (115.667 kg) (06/04 1459)  Intake/Output from previous day: 06/04 0701 - 06/05 0700 In: 1254.3 [I.V.:1254.3] Out: 900 [Urine:900]   Physical Exam: General: Well developed, well nourished, in no acute distress. Head:  Normocephalic and atraumatic. Lungs: Clear to auscultation and percussion. Heart: Normal S1 and S2.  No murmur, rubs or gallops.  Abdomen: soft, non-tender, positive bowel sounds. obese Extremities: No clubbing or cyanosis. No edema. Cath site normal Neurologic: Alert and oriented x 3.    Lab Results:  Recent Labs  07/23/14 2220 07/24/14 0145  WBC 14.2* 14.0*  HGB 13.3 13.4  PLT 293 285    Recent Labs  07/23/14 1501 07/24/14 0145  NA 140 137  K 4.2 3.9  CL 107 104  CO2 25 24  GLUCOSE 148* 117*  BUN 19 11  CREATININE 1.36* 1.03    Recent Labs  07/23/14 1840 07/24/14 0145  TROPONINI 2.38* 10.77*   Hepatic Function Panel No results for input(s): PROT, ALBUMIN, AST, ALT, ALKPHOS, BILITOT, BILIDIR, IBILI in the last 72 hours.  Recent Labs  07/24/14 0417  CHOL 180   No results for input(s): PROTIME in the last 72 hours.  Imaging: Ct Head Wo Contrast  07/23/2014   CLINICAL DATA:  Chest pain.  Right-sided weakness.  EXAM: CT HEAD WITHOUT CONTRAST   TECHNIQUE: Contiguous axial images were obtained from the base of the skull through the vertex without intravenous contrast.  COMPARISON:  04/30/2012  FINDINGS: No mass lesion. No midline shift. No acute hemorrhage or hematoma. No extra-axial fluid collections. No evidence of acute infarction. Brain parenchyma is normal. Slight asymmetry of the lateral ventricles is felt to be congenital and stable. No osseous abnormality.  IMPRESSION: Normal CT scan.   Electronically Signed   By: Lorriane Shire M.D.   On: 07/23/2014 15:54   Personally viewed.   Telemetry: 5 beats AIVR Personally viewed.   EKG:  Inf infarct pattern.   Cardiac Studies:  Cath - distal RCA clot. No PCI  Dist RCA lesion, 30% stenosed. There is a 0% residual stenosis post intervention.  Normal LV function with normal-appearing aortic root size on left ventriculography.  Single vessel CAD with thrombus in the distal RCA, most likely due to ruptured soft 20-30% plaque; normal LAD and left circumflex vessels.  Successful PTCA of the distal RCA with marked improvement in the initial thrombus with narrowing reduced to 0% and brisk TIMI-3 flow.  RECOMMENDATION:  The patient will continue with Angiomax/Aggrastat postprocedure. He should continue dual antiplatelets therapy. A 2-D echo Doppler study will be performed to make certain there are no other etiologies for thrombus/embolization.  Scheduled Meds: . aspirin EC  81 mg Oral Daily  . atorvastatin  80 mg Oral q1800  . metoprolol tartrate  12.5 mg Oral BID  . sodium chloride  3 mL Intravenous Q12H  . ticagrelor  90 mg Oral BID   Continuous Infusions: . tirofiban 0.15 mcg/kg/min (07/24/14 0606)   PRN Meds:.sodium chloride, acetaminophen, nitroGLYCERIN, ondansetron (ZOFRAN) IV, sodium chloride  Assessment/Plan:  Active Problems:   ST elevation myocardial infarction involving right coronary artery   ST elevation myocardial infarction (STEMI) involving right coronary  artery with complication   ACS (acute coronary syndrome)  INF STEMI  - stopping aggrastat at noon  - Brilinta, ASA  - Atorva 80 (high intensity)  - normal EF  - Trop 10  - Reperfusion rhythm  Hyperlipidemia  - atorva 80  - LDL 121, HDL 25  Obese  - weight loss  Cardiac rehab  Transfer to tele bed  Eduardo Poole 07/24/2014, 8:53 AM

## 2014-07-24 NOTE — Progress Notes (Signed)
Patient transferred to 2W28. Patient's vital signs were stable and they had no complaints of pain. Patient had cell phone in his possession, all other personal items were previously sent home with the patient's wife.

## 2014-07-24 NOTE — Progress Notes (Signed)
Discussed importance of SCDs while pt is in the hospital; pt and wife verbalized understanding along with information that 7 years ago pt was on coumadin and lovenox for a DVT in RLE. No s/s of such at this time, will cont to monitor, discussed s/s of DVT. Pt has SCDs on.

## 2014-07-25 ENCOUNTER — Inpatient Hospital Stay (HOSPITAL_COMMUNITY): Payer: BLUE CROSS/BLUE SHIELD

## 2014-07-25 ENCOUNTER — Encounter (HOSPITAL_COMMUNITY): Payer: Self-pay | Admitting: Cardiovascular Disease

## 2014-07-25 DIAGNOSIS — G459 Transient cerebral ischemic attack, unspecified: Secondary | ICD-10-CM

## 2014-07-25 LAB — HEMOGLOBIN A1C
Hgb A1c MFr Bld: 5.8 % — ABNORMAL HIGH (ref 4.8–5.6)
Mean Plasma Glucose: 120 mg/dL

## 2014-07-25 MED ORDER — HEPARIN SODIUM (PORCINE) 5000 UNIT/ML IJ SOLN
5000.0000 [IU] | Freq: Three times a day (TID) | INTRAMUSCULAR | Status: DC
  Administered 2014-07-25 – 2014-07-26 (×4): 5000 [IU] via SUBCUTANEOUS
  Filled 2014-07-25 (×4): qty 1

## 2014-07-25 NOTE — Progress Notes (Signed)
Echocardiogram 2D Echocardiogram has been performed.  Tresa Res 07/25/2014, 11:14 AM

## 2014-07-25 NOTE — Progress Notes (Signed)
VASCULAR LAB PRELIMINARY  PRELIMINARY  PRELIMINARY  PRELIMINARY  Carotid Dopplers completed.    Preliminary report:  1-39% ICA stenosis.  Vertebral artery flow is antegrade.   Juvencio Verdi, RVT 07/25/2014, 9:49 AM

## 2014-07-25 NOTE — Progress Notes (Signed)
Subjective:  No CP or dyspnea  Objective:  Vital Signs in the last 24 hours: Temp:  [98.8 F (37.1 C)] 98.8 F (37.1 C) (06/05 2247) Pulse Rate:  [77-83] 83 (06/05 2247) Resp:  [20-26] 20 (06/05 2247) BP: (138-147)/(80-93) 142/92 mmHg (06/05 2247) SpO2:  [96 %-98 %] 98 % (06/05 2247) Weight:  [244 lb 14.9 oz (111.1 kg)-254 lb 13.6 oz (115.6 kg)] 244 lb 14.9 oz (111.1 kg) (06/05 2247)  Intake/Output from previous day: 06/05 0701 - 06/06 0700 In: 864 [P.O.:760; I.V.:104] Out: 1250 [Urine:1250]   Physical Exam: General: Well developed, well nourished, in no acute distress. Head:  Normocephalic and atraumatic. Lungs: Clear to auscultation and percussion. Heart: Normal S1 and S2.  No murmur, rubs or gallops.  Abdomen: soft, non-tender, right groin with no hematoma and no bruit Extremities: No edema.  Neurologic: Alert and oriented x 3.    Lab Results:  Recent Labs  07/24/14 0145 07/24/14 0826  WBC 14.0* 15.3*  HGB 13.4 13.9  PLT 285 290    Recent Labs  07/24/14 0145 07/24/14 0826  NA 137 136  K 3.9 3.7  CL 104 106  CO2 24 24  GLUCOSE 117* 131*  BUN 11 10  CREATININE 1.03 0.95    Recent Labs  07/24/14 0145 07/24/14 0826  TROPONINI 10.77* 12.04*    Recent Labs  07/24/14 0417  CHOL 180   No results for input(s): PROTIME in the last 72 hours.  Imaging: Ct Head Wo Contrast  07/23/2014   CLINICAL DATA:  Chest pain.  Right-sided weakness.  EXAM: CT HEAD WITHOUT CONTRAST  TECHNIQUE: Contiguous axial images were obtained from the base of the skull through the vertex without intravenous contrast.  COMPARISON:  04/30/2012  FINDINGS: No mass lesion. No midline shift. No acute hemorrhage or hematoma. No extra-axial fluid collections. No evidence of acute infarction. Brain parenchyma is normal. Slight asymmetry of the lateral ventricles is felt to be congenital and stable. No osseous abnormality.  IMPRESSION: Normal CT scan.   Electronically Signed   By: Lorriane Shire M.D.   On: 07/23/2014 15:54    EKG:  Inf infarct pattern.   Cardiac Studies:  Cath - distal RCA clot. No PCI  Dist RCA lesion, 30% stenosed. There is a 0% residual stenosis post intervention.  Normal LV function with normal-appearing aortic root size on left ventriculography.  Single vessel CAD with thrombus in the distal RCA, most likely due to ruptured soft 20-30% plaque; normal LAD and left circumflex vessels.  Successful PTCA of the distal RCA with marked improvement in the initial thrombus with narrowing reduced to 0% and brisk TIMI-3 flow.  RECOMMENDATION:  The patient will continue with Angiomax/Aggrastat postprocedure. He should continue dual antiplatelets therapy. A 2-D echo Doppler study will be performed to make certain there are no other etiologies for thrombus/embolization.  Scheduled Meds: . aspirin EC  81 mg Oral Daily  . atorvastatin  80 mg Oral q1800  . metoprolol tartrate  12.5 mg Oral BID  . sodium chloride  3 mL Intravenous Q12H  . ticagrelor  90 mg Oral BID   Continuous Infusions:   PRN Meds:.sodium chloride, acetaminophen, nitroGLYCERIN, ondansetron (ZOFRAN) IV, sodium chloride  Assessment/Plan:  INF STEMI  - Brilinta, ASA  - Atorva 80 (high intensity)  - normal EF  - Trop 10  Hyperlipidemia  - atorva 80  - LDL 121, HDL 25  Obese  - weight loss  TIA  - CT negative; plan  echo to R/O SOE and carotid dopplers Ambulate today and DC in AM if stable. FU Dr Claiborne Billings. Will need repeat CBC in 2 weeks for elevated WBC.  Kirk Ruths 07/25/2014, 8:37 AM

## 2014-07-25 NOTE — Care Management Note (Addendum)
Case Management Note  Patient Details  Name: Eduardo Poole MRN: [id number] Date of Birth: 08-Nov-1970  Subjective/Objective:   Pt admitted with STEMI                 Action/Plan: PTA pt lived at home  Expected Discharge Date:  07/25/14               Expected Discharge Plan:  Home/Self Care  In-House Referral:     Discharge planning Services  CM Consult, Medication Assistance  Post Acute Care Choice:    Choice offered to:     DME Arranged:    DME Agency:     HH Arranged:    [place]:     Status of Service:     Medicare Important Message Given:  No Date Medicare IM Given:    Medicare IM give by:    Date Additional Medicare IM Given:    Additional Medicare Important Message give by:     If discussed at [place] of Stay Meetings, dates discussed:    Additional Comments: Pt to go home on Brilinta- benefits check completed: Per rep at [facility]:   No auth required  30 day retail- $62.11 at most major retail pharmacies  90 day retail- $170.92 at [facility] or [employer] with pt regarding above- info shared and Brilinta book given to pt that has both 30 day free card and copay savings card. Pt encouraged to call Brilinta Pt support services. Per pt he uses [facility]- call made to pharmacy which does not have drug in stock but can order drug- [facility] [place] [facility] and [facility] do [facility] drug in [facility] - pt and wife informed of pharmacy info- plan to fill [facility] then use [facility] there after.   Marvetta Gibbons [place], RN- [phone] 07/25/2014, 10:45 AM

## 2014-07-25 NOTE — Progress Notes (Signed)
CARDIAC REHAB PHASE I   PRE:  Rate/Rhythm: 97 SR  BP:  Supine:   Sitting: 150/90  Standing:    SaO2:   MODE:  Ambulation: 550 ft   POST:  Rate/Rhythm: 110 ST  BP:  Supine:   Sitting: 150/90  Standing:    SaO2: 97%RA 0810-0915 Pt walked 550 ft with steady gait. No CP. Tolerated well. MI education completed with pt who voiced understanding. Discussed CRP 2 and pt gave permission to refer to [place]. Pt needs booklet and will let case manager know.   Graylon Good, RN BSN  07/25/2014 9:12 AM

## 2014-07-25 NOTE — Progress Notes (Signed)
Utilization review completed.  

## 2014-07-26 LAB — POCT ACTIVATED CLOTTING TIME: ACTIVATED CLOTTING TIME: 362 s

## 2014-07-26 MED ORDER — TICAGRELOR 90 MG PO TABS: 90.0000 mg | ORAL_TABLET | Freq: Two times a day (BID) | ORAL | Status: DC

## 2014-07-26 MED ORDER — ASPIRIN 81 MG PO TBEC: 81.0000 mg | DELAYED_RELEASE_TABLET | Freq: Every day | ORAL | Status: AC

## 2014-07-26 MED ORDER — NITROGLYCERIN 0.4 MG SL SUBL: 0.4000 mg | SUBLINGUAL_TABLET | SUBLINGUAL | Status: DC | PRN

## 2014-07-26 MED ORDER — ATORVASTATIN CALCIUM 80 MG PO TABS: 80.0000 mg | ORAL_TABLET | Freq: Every day | ORAL | Status: DC

## 2014-07-26 MED ORDER — METOPROLOL TARTRATE 25 MG PO TABS: 12.5000 mg | ORAL_TABLET | Freq: Two times a day (BID) | ORAL | Status: DC

## 2014-07-26 MED FILL — Heparin Sodium (Porcine) 2 Unit/ML in Sodium Chloride 0.9%: INTRAMUSCULAR | Qty: 1000 | Status: AC

## 2014-07-26 MED FILL — Lidocaine HCl Local Preservative Free (PF) Inj 1%: INTRAMUSCULAR | Qty: 30 | Status: AC

## 2014-07-26 NOTE — Progress Notes (Signed)
Subjective:  No further CP. No further full body weakness. During episode after pressure washing, felt like he could not move both feet or hands bilaterally. CT head normal. No symptoms now.   No fevers at home.   Had stress test by cardiologist in the recent past for atypical CP. Endoscopy as well (hiatal hernia)  Works as an Insurance account manager.  Objective:  Vital Signs in the last 24 hours: Temp:  [97.4 F (36.3 C)-98.6 F (37 C)] 97.4 F (36.3 C) (06/07 1337) Pulse Rate:  [79-84] 80 (06/07 1337) Resp:  [18] 18 (06/07 1337) BP: (116-136)/(60-83) 116/77 mmHg (06/07 1337) SpO2:  [98 %-99 %] 99 % (06/07 1337)  Intake/Output from previous day: 06/06 0701 - 06/07 0700 In: 480 [P.O.:480] Out: 2000 [Urine:2000]   Physical Exam: General: Well developed, well nourished, in no acute distress. Head:  Normocephalic and atraumatic. Lungs: Clear to auscultation and percussion. Heart: Normal S1 and S2.  No murmur, rubs or gallops.  Abdomen: soft, non-tender, positive bowel sounds. obese Extremities: No clubbing or cyanosis. No edema. Cath site normal Neurologic: Alert and oriented x 3.    Lab Results:  Recent Labs  07/24/14 0145 07/24/14 0826  WBC 14.0* 15.3*  HGB 13.4 13.9  PLT 285 290    Recent Labs  07/24/14 0145 07/24/14 0826  NA 137 136  K 3.9 3.7  CL 104 106  CO2 24 24  GLUCOSE 117* 131*  BUN 11 10  CREATININE 1.03 0.95    Recent Labs  07/24/14 0145 07/24/14 0826  TROPONINI 10.77* 12.04*   Hepatic Function Panel No results for input(s): PROT, ALBUMIN, AST, ALT, ALKPHOS, BILITOT, BILIDIR, IBILI in the last 72 hours.  Recent Labs  07/24/14 0417  CHOL 180   No results for input(s): PROTIME in the last 72 hours.  Imaging: No results found. Personally viewed.   Telemetry: 5 beats AIVR Personally viewed.   EKG:  Inf infarct pattern.   Cardiac Studies:  Cath - distal RCA clot. No PCI  Dist RCA lesion, 30% stenosed. There is a 0%  residual stenosis post intervention.  Normal LV function with normal-appearing aortic root size on left ventriculography.  Single vessel CAD with thrombus in the distal RCA, most likely due to ruptured soft 20-30% plaque; normal LAD and left circumflex vessels.  Successful PTCA of the distal RCA with marked improvement in the initial thrombus with narrowing reduced to 0% and brisk TIMI-3 flow.  RECOMMENDATION:  The patient will continue with Angiomax/Aggrastat postprocedure. He should continue dual antiplatelets therapy. A 2-D echo Doppler study will be performed to make certain there are no other etiologies for thrombus/embolization.  Scheduled Meds: . aspirin EC  81 mg Oral Daily  . atorvastatin  80 mg Oral q1800  . heparin subcutaneous  5,000 Units Subcutaneous 3 times per day  . metoprolol tartrate  12.5 mg Oral BID  . sodium chloride  3 mL Intravenous Q12H  . ticagrelor  90 mg Oral BID   Continuous Infusions:   PRN Meds:.sodium chloride, acetaminophen, nitroGLYCERIN, ondansetron (ZOFRAN) IV, sodium chloride  Assessment/Plan:  Active Problems:   ST elevation myocardial infarction (STEMI) involving right coronary artery with complication   ACS (acute coronary syndrome)  INF STEMI  - Brilinta, ASA  - Atorva 80 (high intensity)  - normal EF 65%  - Trop 10  - Reperfusion rhythm  Hyperlipidemia  - atorva 80  - LDL 121, HDL 25  Obese  - weight loss  Cardiac rehab  Questionable TIA  - CT scan normal. Carotid Dopplers normal.  Okay to discharge home. Repeat CBC in 2 weeks, elevated.  Zaelynn Fuchs, [place] 07/26/2014, 2:13 PM

## 2014-07-26 NOTE — Discharge Summary (Signed)
Physician Discharge Summary     Cardiologist:  Kelly-New  Patient ID: Eduardo Poole MRN: [id number] DOB/AGE: 18-Feb-1971 44 y.o.  Admit date: 07/23/2014 Discharge date: 07/26/2014  Admission Diagnoses:  STEMI  Discharge Diagnoses:  Active Problems:   ST elevation myocardial infarction (STEMI) involving right coronary artery with complication   ACS (acute coronary syndrome)   Discharged Condition: stable  Hospital Course:    The patient is a 44 y/o male with no prior cardiac history who was transferred today from [facility] for ACS. He denies any past history of HTN, HLD or DM. He has a h/o DVT 8 years ago and was treated with Coumadin. He has a remote history of smokeless tobacco use. He was in his usual state of health until around 1PM today when he developed severe substernal chest pressure while pressure-washing his house. The pain did not go away with rest, prompting him to go to [place]. There, EKG showed mild inferior ST elevations. Troponin was mildly elevated at 0.06. Also while in the ER, he was noted to have transient TIA like symptoms with brief slurring of speech and unilateral upper extremity numbness/ weakness. This only lasted several min. STAT CT of head showed no acute CVA. He was transported urgently to [facility] for further care and emergent [facility]. On arrival to [facility], he was still with chest discomfort. No further neurological deficits. Visualization of the left coronary system showed widely patent vessels. The RCA appeared to have ~30% mid stenosis with good flow but with evidence of clot distally.  Angioplasty was completed.  It was felt that perhaps he had palque that had rupture and that his clot had resolved after dose of Angiomax. There was also question of possible embolization of clot to cerebral vasculature and that he suffered a possible TIA. Left ventriculography showed normal LVF and normal appearing aortic anatomy. He was continued on Angiomax and started on ASA and   Brilinta.  Lopressor added 12.5mg  bid.  Last troponin was 12.04.  Echocardiogram revealed ejection fraction of 60-65% with normal wall motion. Grade 1 diastolic dysfunction. Borderline increased LV pressure.  No source of emboli was identified.   Carotid Doppler showed bilateral intimal wall thickening CCA. 1-39% ICA stenosis.  Vertebral artery flow is antegrade. ICA/CCA ratio: R-1.0 L-0.79.   The patient was seen by Dr. Marlou Porch who felt he was stable for DC home.  He may return to work on August 01, 2014.    Lipids and LFTs in 6 weeks.  Consults: Cardiac rehabilitation.  Significant Diagnostic Studies:  Lipid Panel     Component Value Date/Time   CHOL 180 07/24/2014 0417   TRIG 170* 07/24/2014 0417   HDL 25* 07/24/2014 0417   CHOLHDL 7.2 07/24/2014 0417   VLDL 34 07/24/2014 0417   LDLCALC 121* 07/24/2014 0417     Echocardiogram Study Conclusions  - Left ventricle: The cavity size was normal. Wall thickness was increased in a pattern of mild LVH. Systolic function was normal. The estimated ejection fraction was in the range of 60% to 65%. Wall motion was normal; there were no regional wall motion abnormalities. Doppler parameters are consistent with abnormal left ventricular relaxation (grade 1 diastolic dysfunction). The E/e&' ratio is around 8, suggesting borderline increased LV filling pressure. - Mitral valve: Structurally normal valve. There was trivial regurgitation. - Left atrium: The atrium was normal in size.  Impressions:  - LVEF 60-65%, mild LVH, diastolic dysfunction with borderline elevated LV filling pressure, normal LA size.   Cardiac  catheterization   Dist RCA lesion, 30% stenosed. There is a 0% residual stenosis post intervention.  Normal LV function with normal-appearing aortic root size on left ventriculography.  Single vessel CAD with thrombus in the distal RCA, most likely due to ruptured soft 20-30% plaque; normal LAD and left  circumflex vessels.  Successful PTCA of the distal RCA with marked improvement in the initial thrombus with narrowing reduced to 0% and brisk TIMI-3 flow.  RECOMMENDATION:  The patient will continue with Angiomax/Aggrastat postprocedure. He should continue dual antiplatelets therapy. A 2-D echo Doppler study will be performed to make certain there are no other etiologies for thrombus/embolization.  Treatments: See above  Discharge Exam: Blood pressure 116/77, pulse 80, temperature 97.4 F (36.3 C), temperature source Oral, resp. rate 18, height 5\' 10"  (1.778 m), weight 244 lb 14.9 oz (111.1 kg), SpO2 99 %.   Disposition: 01-Home or Self Care      Discharge Instructions    Amb Referral to Cardiac Rehabilitation    Complete by:  As directed   Congestive Heart Failure: If diagnosis is Heart Failure, patient MUST meet each of the CMS criteria: 1. Left Ventricular Ejection Fraction </= 35% 2. NYHA class II-IV symptoms despite being on optimal heart failure therapy for at least 6 weeks. 3. Stable = have not had a recent (<6 weeks) or planned (<6 months) major cardiovascular hospitalization or procedure  Program Details: - Physician supervised classes - 1-3 classes per week over a 12-18 week period, generally for a total of 36 sessions  Physician Certification: I certify that the above Cardiac Rehabilitation treatment is medically necessary and is medically approved by me for treatment of this patient. The patient is willing and cooperative, able to ambulate and medically stable to participate in exercise rehabilitation. The participant's progress and Individualized Treatment Plan will be reviewed by the Medical Director, Cardiac Rehab staff and as indicated by the Referring/Ordering Physician.  Diagnosis:  Myocardial Infarction     Diet - low sodium heart healthy    Complete by:  As directed      Increase activity slowly    Complete by:  As directed             Medication List      TAKE these medications        aspirin 81 MG EC tablet  Take 1 tablet (81 mg total) by mouth daily.     atorvastatin 80 MG tablet  Commonly known as:  LIPITOR  Take 1 tablet (80 mg total) by mouth daily at 6 PM.     diphenhydrAMINE-zinc acetate cream  Commonly known as:  BENADRYL  Apply 1 application topically 3 (three) times daily as needed for itching.     metoprolol tartrate 25 MG tablet  Commonly known as:  LOPRESSOR  Take 0.5 tablets (12.5 mg total) by mouth 2 (two) times daily.     nitroGLYCERIN 0.4 MG SL tablet  Commonly known as:  NITROSTAT  Place 1 tablet (0.4 mg total) under the tongue every 5 (five) minutes as needed for chest pain.     omeprazole 20 MG capsule  Commonly known as:  PRILOSEC  Take 20 mg by mouth as needed (for heartburn).     ticagrelor 90 MG Tabs tablet  Commonly known as:  BRILINTA  Take 1 tablet (90 mg total) by mouth 2 (two) times daily.       Follow-up Information    Follow up with [facility], NP On 08/24/2014.  Specialties:  Cardiology, Radiology   Why:  9:00 AM   Contact information:   [address] [place] [address] [phone]       Greater than 30 minutes was spent completing the patient's discharge.   SignedTarri Fuller, [place] 07/26/2014, 2:18 PM

## 2014-07-27 ENCOUNTER — Telehealth: Payer: Self-pay | Admitting: Cardiovascular Disease

## 2014-07-27 NOTE — Telephone Encounter (Signed)
Patient contacted regarding discharge from [facility] on 07/26/2014.  Patient understands to follow up with provider Cecilie Kicks, NP on July 6th, 2016 at [place] [facility] [place]. Patient understands discharge instructions? yes Patient understands medications and regiment? yes Patient understands to bring all medications to this visit? yes  No additional advice necessary.

## 2014-07-27 NOTE — Telephone Encounter (Signed)
Pt needs a post hosp f/u call He will be seeing Mickel Baas on 7/6 at 9:00am

## 2014-07-29 ENCOUNTER — Telehealth: Payer: Self-pay | Admitting: Cardiology

## 2014-07-29 NOTE — Telephone Encounter (Signed)
Spoke to wife  She aware to contact PCP

## 2014-07-29 NOTE — Telephone Encounter (Signed)
I agree PCP should handle.  Tarri Fuller, [facility]

## 2014-07-29 NOTE — Telephone Encounter (Signed)
SPOKE TO PATIENT WIFE SHE INDICATE HE ANXIOUS AND GETS UPSET  AND SHE IS NOT SURE IF THIS IS SOMETHING THAT  MAY EFFECT HIM RN INFORMED WIFE THIS MAYBE A WAY OF PATIENT COPING WITH WHAT HAS OCCURRED WITH HIM. USUALLY  PRIMARY HANDLES THIS TYPE OF MEDICATION. RECOMMEND CONTACTING HIS PRIMARY -- TO CATCH HIM UP ON WHAT HAS RECENTLY OCCURRED. WILL FORWARD TO  BRYAN HAGER PA -- WIFE IS AWARE.

## 2014-07-29 NOTE — Telephone Encounter (Signed)
Pt has an angioplasty on Saturday.He is experiencing anxiety and very anxious. Is there something he can take for this?

## 2014-08-02 ENCOUNTER — Ambulatory Visit (INDEPENDENT_AMBULATORY_CARE_PROVIDER_SITE_OTHER): Payer: BLUE CROSS/BLUE SHIELD | Admitting: Family Medicine

## 2014-08-02 ENCOUNTER — Encounter: Payer: Self-pay | Admitting: Family Medicine

## 2014-08-02 VITALS — BP 110/80 | HR 75 | Temp 98.7°F | Resp 16 | Wt 250.0 lb

## 2014-08-02 DIAGNOSIS — G4700 Insomnia, unspecified: Secondary | ICD-10-CM | POA: Insufficient documentation

## 2014-08-02 DIAGNOSIS — I519 Heart disease, unspecified: Secondary | ICD-10-CM

## 2014-08-02 DIAGNOSIS — R5383 Other fatigue: Secondary | ICD-10-CM | POA: Insufficient documentation

## 2014-08-02 DIAGNOSIS — R519 Headache, unspecified: Secondary | ICD-10-CM | POA: Insufficient documentation

## 2014-08-02 DIAGNOSIS — Z8673 Personal history of transient ischemic attack (TIA), and cerebral infarction without residual deficits: Secondary | ICD-10-CM | POA: Diagnosis not present

## 2014-08-02 DIAGNOSIS — Z86718 Personal history of other venous thrombosis and embolism: Secondary | ICD-10-CM | POA: Insufficient documentation

## 2014-08-02 DIAGNOSIS — R030 Elevated blood-pressure reading, without diagnosis of hypertension: Secondary | ICD-10-CM

## 2014-08-02 DIAGNOSIS — Z86010 Personal history of colonic polyps: Secondary | ICD-10-CM | POA: Insufficient documentation

## 2014-08-02 DIAGNOSIS — F329 Major depressive disorder, single episode, unspecified: Secondary | ICD-10-CM | POA: Insufficient documentation

## 2014-08-02 DIAGNOSIS — E785 Hyperlipidemia, unspecified: Secondary | ICD-10-CM | POA: Diagnosis not present

## 2014-08-02 DIAGNOSIS — Z131 Encounter for screening for diabetes mellitus: Secondary | ICD-10-CM | POA: Insufficient documentation

## 2014-08-02 DIAGNOSIS — F419 Anxiety disorder, unspecified: Secondary | ICD-10-CM | POA: Insufficient documentation

## 2014-08-02 DIAGNOSIS — R51 Headache: Secondary | ICD-10-CM

## 2014-08-02 DIAGNOSIS — IMO0001 Reserved for inherently not codable concepts without codable children: Secondary | ICD-10-CM | POA: Insufficient documentation

## 2014-08-02 DIAGNOSIS — G479 Sleep disorder, unspecified: Secondary | ICD-10-CM | POA: Insufficient documentation

## 2014-08-02 DIAGNOSIS — R0789 Other chest pain: Secondary | ICD-10-CM | POA: Insufficient documentation

## 2014-08-02 DIAGNOSIS — H532 Diplopia: Secondary | ICD-10-CM | POA: Insufficient documentation

## 2014-08-02 DIAGNOSIS — Z860101 Personal history of adenomatous and serrated colon polyps: Secondary | ICD-10-CM | POA: Insufficient documentation

## 2014-08-02 DIAGNOSIS — F32A Depression, unspecified: Secondary | ICD-10-CM | POA: Insufficient documentation

## 2014-08-02 DIAGNOSIS — I5189 Other ill-defined heart diseases: Secondary | ICD-10-CM

## 2014-08-02 DIAGNOSIS — I2111 ST elevation (STEMI) myocardial infarction involving right coronary artery: Secondary | ICD-10-CM | POA: Diagnosis not present

## 2014-08-02 DIAGNOSIS — I10 Essential (primary) hypertension: Secondary | ICD-10-CM | POA: Insufficient documentation

## 2014-08-02 DIAGNOSIS — R091 Pleurisy: Secondary | ICD-10-CM | POA: Insufficient documentation

## 2014-08-02 DIAGNOSIS — R591 Generalized enlarged lymph nodes: Secondary | ICD-10-CM | POA: Insufficient documentation

## 2014-08-02 MED ORDER — SERTRALINE HCL 50 MG PO TABS: ORAL_TABLET | ORAL | Status: DC

## 2014-08-02 NOTE — Progress Notes (Signed)
Patient: Eduardo Poole Male    DOB: 23-Sep-1970   44 y.o.   MRN: [id number] Visit Date: 08/02/2014  Today's Provider: Lelon Huh, MD   Chief Complaint  Patient presents with  . Code STEMI    Hospital Follow up   Subjective:    HPI   Patient was recently admitted into [facility] on 07/23/2014 and discharged on 07/26/2014. Per discharge summary, patient is to have Cardiac Rehabilitation treatment. Patient is scheduled to follow up with Cardiologist Cecilie Kicks on 08/24/2014.  He was admitted after becoming paralyzed on his left side, developing chest pain and subsequently ruling in for MI. Echo showed mild LVH and grade 1 diastolic dysfunction. Neuroimaging was negative for CVA, and carotids showed no significant stenosis. He was thought to have had TIA. He had angioplasty, but no stenting.  He states he has had no chest pain since discharge, and strength has returned nearly to baselin, but he still feels very fatigued. He has returned to work, but very fatigued.   He was discharged on Brilinta, nitrostat, aspirin, atorvastatin, and metoprolol which he has been taking consistently.     Previous Medications   ASPIRIN EC 81 MG EC TABLET    Take 1 tablet (81 mg total) by mouth daily.   ATORVASTATIN (LIPITOR) 80 MG TABLET    Take 1 tablet (80 mg total) by mouth daily at 6 PM.   DIPHENHYDRAMINE-ZINC ACETATE (BENADRYL) CREAM    Apply 1 application topically 3 (three) times daily as needed for itching.   METOPROLOL TARTRATE (LOPRESSOR) 25 MG TABLET    Take 0.5 tablets (12.5 mg total) by mouth 2 (two) times daily.   NITROGLYCERIN (NITROSTAT) 0.4 MG SL TABLET    Place 1 tablet (0.4 mg total) under the tongue every 5 (five) minutes as needed for chest pain.   OMEPRAZOLE (PRILOSEC) 20 MG CAPSULE    Take 20 mg by mouth as needed (for heartburn).    TICAGRELOR (BRILINTA) 90 MG TABS TABLET    Take 1 tablet (90 mg total) by mouth 2 (two) times daily.    Review of Systems  Respiratory: Positive  for shortness of breath (with exertion). Negative for cough.   Cardiovascular: Positive for palpitations. Negative for chest pain and leg swelling.  Gastrointestinal: Positive for abdominal pain.  Musculoskeletal: Positive for myalgias (right shoulder pain that radiates into his right arm ).  Neurological: Positive for light-headedness (feels swimmy headed today). Negative for dizziness, seizures, facial asymmetry, numbness and headaches.  All other systems reviewed and are negative.   History  Substance Use Topics  . Smoking status: Never Smoker   . Smokeless tobacco: Former Systems developer    Types: Chew  . Alcohol Use: 0.6 oz/week    1 Standard drinks or equivalent per week     Comment: occasional   Objective:   BP 110/80 mmHg  Pulse 75  Temp(Src) 98.7 F (37.1 C) (Oral)  Resp 16  Wt 250 lb (113.399 kg)  SpO2 100%  Physical Exam  General Appearance:    Alert, cooperative, no distress  Eyes:    PERRL, conjunctiva/corneas clear, EOM's intact       Lungs:     Clear to auscultation bilaterally, respirations unlabored  Heart:    Regular rate and rhythm  Neurologic:   Awake, alert, oriented x 3. No apparent focal neurological           defect.            Assessment & Plan:  1. ST elevation myocardial infarction (STEMI) involving right coronary artery with complication Asymptomatic since discharge. Continue current medications.  Follow up cardiology as scheduled.   2. History of TIA (transient ischemic attack) Likely embolic. Continue current anticoagulants.   3. History of DVT (deep vein thrombosis)   4. Diastolic dysfunction A little fatigued on metoprolol, but BP and rate well controlled. Continue current medications.    5. Dyslipidemia He is tolerating atorvastatin well with no adverse effects. He states cardiologist will be checking lipids at follow up.

## 2014-08-02 NOTE — Progress Notes (Deleted)
   Patient: Eduardo Poole Male    DOB: Mar 03, 1970   44 y.o.   MRN: [id number] Visit Date: 08/02/2014  Today's Provider: Lelon Huh, MD   Chief Complaint  Patient presents with  . Congestive Heart Failure    Hospital Follow up   Subjective:    Congestive Heart Failure      Previous Medications   ASPIRIN EC 81 MG EC TABLET    Take 1 tablet (81 mg total) by mouth daily.   ATORVASTATIN (LIPITOR) 80 MG TABLET    Take 1 tablet (80 mg total) by mouth daily at 6 PM.   DIPHENHYDRAMINE-ZINC ACETATE (BENADRYL) CREAM    Apply 1 application topically 3 (three) times daily as needed for itching.   METOPROLOL TARTRATE (LOPRESSOR) 25 MG TABLET    Take 0.5 tablets (12.5 mg total) by mouth 2 (two) times daily.   NITROGLYCERIN (NITROSTAT) 0.4 MG SL TABLET    Place 1 tablet (0.4 mg total) under the tongue every 5 (five) minutes as needed for chest pain.   OMEPRAZOLE (PRILOSEC) 20 MG CAPSULE    Take 20 mg by mouth as needed (for heartburn).    TICAGRELOR (BRILINTA) 90 MG TABS TABLET    Take 1 tablet (90 mg total) by mouth 2 (two) times daily.    Review of Systems  History  Substance Use Topics  . Smoking status: Never Smoker   . Smokeless tobacco: Former Systems developer    Types: Chew  . Alcohol Use: 0.6 oz/week    1 Standard drinks or equivalent per week     Comment: occasional   Objective:   There were no vitals taken for this visit.  Physical Exam      Assessment & Plan:       Follow up: No Follow-up on file.

## 2014-08-04 ENCOUNTER — Encounter: Payer: Self-pay | Admitting: Family Medicine

## 2014-08-04 DIAGNOSIS — Z8673 Personal history of transient ischemic attack (TIA), and cerebral infarction without residual deficits: Secondary | ICD-10-CM | POA: Insufficient documentation

## 2014-08-04 DIAGNOSIS — I5189 Other ill-defined heart diseases: Secondary | ICD-10-CM | POA: Insufficient documentation

## 2014-08-24 ENCOUNTER — Ambulatory Visit (INDEPENDENT_AMBULATORY_CARE_PROVIDER_SITE_OTHER): Payer: BLUE CROSS/BLUE SHIELD | Admitting: Cardiology

## 2014-08-24 ENCOUNTER — Encounter: Payer: Self-pay | Admitting: Cardiology

## 2014-08-24 VITALS — BP 120/82 | HR 57 | Ht 70.0 in | Wt 242.2 lb

## 2014-08-24 DIAGNOSIS — I25810 Atherosclerosis of coronary artery bypass graft(s) without angina pectoris: Secondary | ICD-10-CM

## 2014-08-24 DIAGNOSIS — I10 Essential (primary) hypertension: Secondary | ICD-10-CM

## 2014-08-24 DIAGNOSIS — E785 Hyperlipidemia, unspecified: Secondary | ICD-10-CM

## 2014-08-24 DIAGNOSIS — Z79899 Other long term (current) drug therapy: Secondary | ICD-10-CM | POA: Diagnosis not present

## 2014-08-24 DIAGNOSIS — G459 Transient cerebral ischemic attack, unspecified: Secondary | ICD-10-CM

## 2014-08-24 NOTE — Progress Notes (Signed)
Cardiology Office Note   Date:  08/24/2014   ID:  Eduardo Poole, DOB Jul 11, 1970, MRN [id number]  PCP:  Lelon Huh, MD  Cardiologist:  Dr. Corky Downs     Chief Complaint  Patient presents with  . Hospitalization Follow-up    c/o little chest pain, little swelling in legs, some SOB, and light headed and dizziness       History of Present Illness: Eduardo Poole is a 44 y.o. male who presents for follow up STEMI of RCA undergoing emergency cath and possible TIA before transport to [facility] from [place].  Pk troponin 12.   Single vessel disease with the RCA appeared to have ~30% mid stenosis with good flow but with evidence of clot distally. Angioplasty was completed. It was felt that perhaps he had palque that had rupture and that his clot had resolved after dose of Angiomax. There was also question of possible embolization of clot to cerebral vasculature and that he suffered a possible TIA.  Echocardiogram revealed ejection fraction of 60-65% with normal wall motion. Grade 1 diastolic dysfunction. Borderline increased LV pressure. No source of emboli was identified. Carotid Doppler showed bilateral intimal wall thickening CCA. 1-39% ICA stenosis. Vertebral artery flow is antegrade. ICA/CCA ratio: R-1.0 L-0.79.CT of head without acute issue.    Pt presents today with no weakness in ext. But + ringing in his ears and lightheaded. BP is stable.  Occ chest pain that is brief.  He has gone back to work and is climbing stairs and walking without problems.  He did not believe his insurance to pay for cardiac rehab. He is eating healthy.  He is following with FP for his anxiety.        Past Medical History  Diagnosis Date  . DVT of lower extremity (deep venous thrombosis)     7 years ago, per pt was on lovenox/coumadin  . MI (myocardial infarction)   . TIA (transient ischemic attack) 07/23/2014    Past Surgical History  Procedure Laterality Date  . Joint replacement    . Cardiac  catheterization N/A 07/23/2014    Procedure: Left Heart Cath;  Surgeon: Troy Sine, MD;  Location: [facility];  Service: Cardiovascular;  Laterality: N/A;  . Cardiac catheterization N/A 07/23/2014    Procedure: Coronary Balloon Angioplasty;  Surgeon: Troy Sine, MD;  Location: [facility];  Service: Cardiovascular;  Laterality: N/A;  . Arthroplasty Right 07/09/2010  . Replacement total knee Right 2011    [facility]  . Nasal sinus surgery      Deviated Septum  . Meniscus repair Right 11/03/1995    Meniscus Lateral Tear: repaired by Dr. Sabra Heck at [facility]  . aspirin EC 81 MG EC tablet Take 1 tablet (81 mg total) by mouth daily.    Marland Kitchen atorvastatin (LIPITOR) 80 MG tablet Take 1 tablet (80 mg total) by mouth daily at 6 PM. 30 tablet 11  . diphenhydrAMINE-zinc acetate (BENADRYL) cream Apply 1 application topically 3 (three) times daily as needed for itching.    . metoprolol tartrate (LOPRESSOR) 25 MG tablet Take 0.5 tablets (12.5 mg total) by mouth 2 (two) times daily. 60 tablet 4  . nitroGLYCERIN (NITROSTAT) 0.4 MG SL tablet Place 1 tablet (0.4 mg total) under the tongue every 5 (five) minutes as needed for chest pain. 25 tablet 12  . omeprazole (PRILOSEC) 20 MG capsule Take  20 mg by mouth as needed (for heartburn).     . sertraline (ZOLOFT) 50 MG tablet 1/2 tablet daily for 6 days, then 1 tablet daily 30 tablet 3  . ticagrelor (BRILINTA) 90 MG TABS tablet Take 1 tablet (90 mg total) by mouth 2 (two) times daily. 60 tablet 11   No current facility-administered medications for this visit.    Allergies:   Review of patient's allergies indicates no known allergies.    Social History:  The patient  reports that he has never smoked. He has quit using smokeless tobacco. His smokeless tobacco use included Chew. He reports that he does not drink alcohol or use illicit drugs.   Family  History:  The patient's family history includes Arthritis in his mother; Diabetes in his paternal grandfather; Diverticulitis in his mother; Gout in his father; Hypertension in his mother.    ROS:  General:no colds or fevers, no weight changes Skin:no rashes or ulcers HEENT:no blurred vision, no congestion, + ringing in the ears, some dizziness CV:see HPI PUL:see HPI GI:no diarrhea constipation or melena, no indigestion GU:no hematuria, no dysuria MS:no joint pain, no claudication Neuro:no syncope, no lightheadedness Endo:no diabetes, no thyroid disease  Wt Readings from Last 3 Encounters:  08/24/14 242 lb 3.2 oz (109.861 kg)  08/02/14 250 lb (113.399 kg)  07/24/14 244 lb 14.9 oz (111.1 kg)     PHYSICAL EXAM: VS:  BP 120/82 mmHg  Pulse 57  Ht 5\' 10"  (1.778 m)  Wt 242 lb 3.2 oz (109.861 kg)  BMI 34.75 kg/m2 , BMI Body mass index is 34.75 kg/(m^2). General:Pleasant affect, NAD Skin:Warm and dry, brisk capillary refill HEENT:normocephalic, sclera clear, mucus membranes moist Neck:supple, no JVD, no bruits  Heart:S1S2 RRR without murmur, gallup, rub or click Lungs:clear without rales, rhonchi, or wheezes XBM:WUXL, non tender, + BS, do not palpate liver spleen or masses Ext:no lower ext edema, 2+ pedal pulses, 2+ radial pulses Neuro:alert and oriented X 3, MAE, follows commands, + facial symmetry, grips equal and strong bil.      EKG:  EKG is ordered today. The ekg ordered today demonstrates SBrady at 72 with inf. MI with new t wave inversions in III, AVF and V6 since discharge EKG 07/24/14   Recent Labs: 07/24/2014: BUN 10; Creatinine, Ser 0.95; Hemoglobin 13.9; Platelets 290; Potassium 3.7; Sodium 136    Lipid Panel    Component Value Date/Time   CHOL 180 07/24/2014 0417   TRIG 170* 07/24/2014 0417   HDL 25* 07/24/2014 0417   CHOLHDL 7.2 07/24/2014 0417   VLDL 34 07/24/2014 0417   LDLCALC 121* 07/24/2014 0417       Other studies Reviewed: Additional studies/  records that were reviewed today include: .  Echo: Left ventricle: The cavity size was normal. Wall thickness was increased in a pattern of mild LVH. Systolic function was normal. The estimated ejection fraction was in the range of 60% to 65%. Wall motion was normal; there were no regional wall motion abnormalities. Doppler parameters are consistent with abnormal left ventricular relaxation (grade 1 diastolic dysfunction). The E/e&' ratio is around 8, suggesting borderline increased LV filling pressure. - Mitral valve: Structurally normal valve. There was trivial regurgitation. - Left atrium: The atrium was normal in size.  Impressions:  - LVEF 60-65%, mild LVH, diastolic dysfunction with borderline elevated LV filling pressure, normal LA size  ASSESSMENT AND PLAN:  1.  STEMI due to clot undergoing POBA 07/23/14 emergently.  On statin and BB, occ pain, but only  single vessel disease.  EKG with evolutionary changes of MI.  Follow up with Dr. Corky Downs in 3 months.   Continue Brilinta and ASA.  2.  TIA prior to transport to [facility], symptoms resolved, CT head negative, now with ringing in the ears and dizziness.  Discussed with Dr. Jerilynn Mages. Croitoru- DOD- will send to neurology for consult, pt prefers MD in [place].  Will consult with Dr. Tamala Julian.   3. Hyperlipidemia- continue statin and recheck labs in 2 weeks.      Current medicines are reviewed with the patient today.  The patient Has no concerns regarding medicines.  The following changes have been made:  See above Labs/ tests ordered today include:see above  Disposition:   FU:  see above  Signed, Isaiah Serge, NP  08/24/2014 9:49 AM    [facility] [address], [place], [place] [place], [place] Phone: [phone]; Fax: [phone]

## 2014-08-24 NOTE — Patient Instructions (Addendum)
Your physician recommends that you return for lab work fasting in 2 weeks.  You have been referred to Dr. Rodman Key Smith-Neurologist  Located in [place], [place] for evaluation of your TIA and ear ringing.  Your physician recommends that you schedule a follow-up appointment in: 3 months with Dr Claiborne Billings.

## 2014-09-07 LAB — COMPREHENSIVE METABOLIC PANEL
ALT: 55 IU/L — ABNORMAL HIGH (ref 0–44)
AST: 31 IU/L (ref 0–40)
Albumin/Globulin Ratio: 1.9 (ref 1.1–2.5)
Albumin: 4.5 g/dL (ref 3.5–5.5)
Alkaline Phosphatase: 73 IU/L (ref 39–117)
BILIRUBIN TOTAL: 0.6 mg/dL (ref 0.0–1.2)
BUN/Creatinine Ratio: 16 (ref 9–20)
BUN: 16 mg/dL (ref 6–24)
CALCIUM: 9.6 mg/dL (ref 8.7–10.2)
CHLORIDE: 101 mmol/L (ref 97–108)
CO2: 22 mmol/L (ref 18–29)
CREATININE: 1 mg/dL (ref 0.76–1.27)
GFR calc Af Amer: 105 mL/min/{1.73_m2} (ref >59)
GFR calc non Af Amer: 91 mL/min/{1.73_m2} (ref >59)
GLOBULIN, TOTAL: 2.4 g/dL (ref 1.5–4.5)
Glucose: 107 mg/dL — ABNORMAL HIGH (ref 65–99)
POTASSIUM: 4.6 mmol/L (ref 3.5–5.2)
Sodium: 140 mmol/L (ref 134–144)
TOTAL PROTEIN: 6.9 g/dL (ref 6.0–8.5)

## 2014-09-07 LAB — LIPID PANEL
CHOL/HDL RATIO: 3.9 ratio (ref 0.0–5.0)
CHOLESTEROL TOTAL: 97 mg/dL — AB (ref 100–199)
HDL: 25 mg/dL — ABNORMAL LOW (ref >39)
LDL CALC: 45 mg/dL (ref 0–99)
Triglycerides: 133 mg/dL (ref 0–149)
VLDL CHOLESTEROL CAL: 27 mg/dL (ref 5–40)

## 2014-10-04 DIAGNOSIS — R4781 Slurred speech: Secondary | ICD-10-CM | POA: Insufficient documentation

## 2014-10-04 DIAGNOSIS — I6782 Cerebral ischemia: Secondary | ICD-10-CM | POA: Insufficient documentation

## 2014-10-04 DIAGNOSIS — R251 Tremor, unspecified: Secondary | ICD-10-CM | POA: Insufficient documentation

## 2014-10-04 DIAGNOSIS — G939 Disorder of brain, unspecified: Secondary | ICD-10-CM | POA: Insufficient documentation

## 2014-10-12 ENCOUNTER — Other Ambulatory Visit: Payer: Self-pay | Admitting: Neurology

## 2014-10-12 DIAGNOSIS — R4781 Slurred speech: Secondary | ICD-10-CM

## 2014-10-12 DIAGNOSIS — G459 Transient cerebral ischemic attack, unspecified: Secondary | ICD-10-CM

## 2014-10-12 DIAGNOSIS — R251 Tremor, unspecified: Secondary | ICD-10-CM

## 2014-10-17 ENCOUNTER — Telehealth: Payer: Self-pay

## 2014-10-17 ENCOUNTER — Ambulatory Visit (INDEPENDENT_AMBULATORY_CARE_PROVIDER_SITE_OTHER): Payer: BLUE CROSS/BLUE SHIELD | Admitting: Family Medicine

## 2014-10-17 ENCOUNTER — Ambulatory Visit
Admission: RE | Admit: 2014-10-17 | Discharge: 2014-10-17 | Disposition: A | Discharge status: Home / Self Care | Payer: BLUE CROSS/BLUE SHIELD | Source: Ambulatory Visit | Attending: Neurology | Admitting: Neurology

## 2014-10-17 ENCOUNTER — Encounter: Payer: Self-pay | Admitting: Family Medicine

## 2014-10-17 VITALS — BP 120/82 | HR 73 | Temp 98.8°F | Wt 241.0 lb

## 2014-10-17 DIAGNOSIS — G459 Transient cerebral ischemic attack, unspecified: Secondary | ICD-10-CM | POA: Diagnosis present

## 2014-10-17 DIAGNOSIS — R4781 Slurred speech: Secondary | ICD-10-CM

## 2014-10-17 DIAGNOSIS — R002 Palpitations: Secondary | ICD-10-CM | POA: Diagnosis not present

## 2014-10-17 DIAGNOSIS — R251 Tremor, unspecified: Secondary | ICD-10-CM

## 2014-10-17 DIAGNOSIS — I639 Cerebral infarction, unspecified: Secondary | ICD-10-CM | POA: Diagnosis not present

## 2014-10-17 NOTE — Progress Notes (Signed)
Patient: Eduardo Poole Male    DOB: [birth year]   44 y.o.   MRN: [id number] Visit Date: 10/19/2014  Today's Provider: Lelon Huh, MD   Chief Complaint  Patient presents with  . Palpitations   Subjective:    Palpitations  This is a new problem. Episode onset: started 2 days ago. The problem occurs intermittently. The problem has been unchanged. On average, each episode lasts 30 seconds. Nothing aggravates the symptoms. Associated symptoms include anxiety, chest fullness, chest pain, diaphoresis, dizziness, an irregular heartbeat, malaise/fatigue, nausea, near-syncope (feels disoriented) and shortness of breath. Pertinent negatives include no coughing, fever, numbness, syncope, vomiting or weakness. Treatments tried: nitroglycerine tablets. The treatment provided mild relief. Risk factors include being male and stress. past history of MI  Patient states when he experiences the palpitations it takes his breath away. Patient reports he had a total of 10 episodes of palpitations on Saturday and at least 5 episodes today.      No Known Allergies Previous Medications   ASPIRIN EC 81 MG EC TABLET    Take 1 tablet (81 mg total) by mouth daily.   ATORVASTATIN (LIPITOR) 80 MG TABLET    Take 1 tablet (80 mg total) by mouth daily at 6 PM.   DIPHENHYDRAMINE-ZINC ACETATE (BENADRYL) CREAM    Apply 1 application topically 3 (three) times daily as needed for itching.   METOPROLOL TARTRATE (LOPRESSOR) 25 MG TABLET    Take 0.5 tablets (12.5 mg total) by mouth 2 (two) times daily.   NITROGLYCERIN (NITROSTAT) 0.4 MG SL TABLET    Place 1 tablet (0.4 mg total) under the tongue every 5 (five) minutes as needed for chest pain.   OMEPRAZOLE (PRILOSEC) 20 MG CAPSULE    Take 20 mg by mouth as needed (for heartburn).    SERTRALINE (ZOLOFT) 50 MG TABLET    1/2 tablet daily for 6 days, then 1 tablet daily   TICAGRELOR (BRILINTA) 90 MG TABS TABLET    Take 1 tablet (90 mg total) by mouth 2 (two) times daily.      Review of Systems  Constitutional: Positive for malaise/fatigue and diaphoresis. Negative for fever.  Respiratory: Positive for shortness of breath. Negative for cough.   Cardiovascular: Positive for chest pain, palpitations and near-syncope (feels disoriented). Negative for syncope.  Gastrointestinal: Positive for nausea. Negative for vomiting.  Neurological: Positive for dizziness. Negative for weakness and numbness.  Psychiatric/Behavioral: The patient is nervous/anxious.     Social History  Substance Use Topics  . Smoking status: Never Smoker   . Smokeless tobacco: Former Systems developer    Types: Chew  . Alcohol Use: No   Objective:   BP 120/82 mmHg  Pulse 73  Temp(Src) 98.8 F (37.1 C) (Oral)  Wt 241 lb (109.317 kg)  SpO2 97%  Physical Exam  General Appearance:    Alert, cooperative, no distress  Eyes:    PERRL, conjunctiva/corneas clear, EOM's intact       Lungs:     Clear to auscultation bilaterally, respirations unlabored  Heart:    Regular rate and rhythm  Neurologic:   Awake, alert, oriented x 3. No apparent focal neurological           defect.         Assessment & Plan:     1. Palpitations  - EKG 12-Lead - CBC - Comprehensive metabolic panel - T4 AND TSH - Magnesium - Troponin I - Holter monitor - 24 hour; Future  Lelon Huh, MD  [facility]

## 2014-10-17 NOTE — Telephone Encounter (Signed)
Pt concerned about having  some palpitations and heart flutter feeling on Saturday, he stated that he had about 10 episodes on Saturday. Pt states that he is not having any chest pain, nor SOB, nor weakness nor any other symptoms,he did mentioned that he is having some anxiety and that he does have a very stressful job; pt wanted to know if he could be seen.  He also mentioned that he just had an MRI done this am at the [facility]  Scheduled pt today at 03:00 pm Okayed per Dr. Caryn Section.

## 2014-10-19 LAB — COMPREHENSIVE METABOLIC PANEL
ALBUMIN: 4.5 g/dL (ref 3.5–5.5)
ALK PHOS: 77 IU/L (ref 39–117)
ALT: 50 IU/L — ABNORMAL HIGH (ref 0–44)
AST: 26 IU/L (ref 0–40)
Albumin/Globulin Ratio: 1.6 (ref 1.1–2.5)
BUN / CREAT RATIO: 15 (ref 9–20)
BUN: 18 mg/dL (ref 6–24)
Bilirubin Total: 0.5 mg/dL (ref 0.0–1.2)
CALCIUM: 9.8 mg/dL (ref 8.7–10.2)
CO2: 24 mmol/L (ref 18–29)
Chloride: 100 mmol/L (ref 97–108)
Creatinine, Ser: 1.18 mg/dL (ref 0.76–1.27)
GFR calc non Af Amer: 75 mL/min/{1.73_m2} (ref >59)
GFR, EST AFRICAN AMERICAN: 86 mL/min/{1.73_m2} (ref >59)
GLOBULIN, TOTAL: 2.8 g/dL (ref 1.5–4.5)
Glucose: 94 mg/dL (ref 65–99)
Potassium: 4.6 mmol/L (ref 3.5–5.2)
SODIUM: 141 mmol/L (ref 134–144)
Total Protein: 7.3 g/dL (ref 6.0–8.5)

## 2014-10-19 LAB — T4 AND TSH
T4, Total: 7.2 ug/dL (ref 4.5–12.0)
TSH: 2.9 u[IU]/mL (ref 0.450–4.500)

## 2014-10-19 LAB — CBC
HEMATOCRIT: 45 % (ref 37.5–51.0)
HEMOGLOBIN: 14.9 g/dL (ref 12.6–17.7)
MCH: 30 pg (ref 26.6–33.0)
MCHC: 33.1 g/dL (ref 31.5–35.7)
MCV: 91 fL (ref 79–97)
Platelets: 323 10*3/uL (ref 150–379)
RBC: 4.96 x10E6/uL (ref 4.14–5.80)
RDW: 13.5 % (ref 12.3–15.4)
WBC: 9.1 10*3/uL (ref 3.4–10.8)

## 2014-10-19 LAB — TROPONIN I: Troponin I: <0.01 ng/mL (ref 0.00–0.04)

## 2014-10-19 LAB — MAGNESIUM: Magnesium: 2.1 mg/dL (ref 1.6–2.3)

## 2014-10-20 ENCOUNTER — Telehealth: Payer: Self-pay

## 2014-10-20 ENCOUNTER — Other Ambulatory Visit: Payer: Self-pay | Admitting: Family Medicine

## 2014-10-20 DIAGNOSIS — Z8673 Personal history of transient ischemic attack (TIA), and cerebral infarction without residual deficits: Secondary | ICD-10-CM | POA: Insufficient documentation

## 2014-10-20 NOTE — Telephone Encounter (Signed)
Called patient to get more information. Patient states he was seen by A Neurologist at [facility] on Monday who ordered an MRI. Patient states the MRI verified that he had a stroke. Patient states he was told that a plauque broke loose in an artery and went to his brain. Patient states the Neurologist advised him to continue taking a low dose Aspirin for now. MRI report is in patient cart in the Imaging section.

## 2014-10-20 NOTE — Telephone Encounter (Signed)
Patient advised as directed below. Also told patient we are still waiting on the Holter Monitor results. Per patient they called him and told him that they were going to sent patients results today an that he wants Dr.Fisher know that it did verify that he ha a stroke.   Thanks,  -Gurleen Larrivee

## 2014-10-20 NOTE — Telephone Encounter (Signed)
-----   Message from Birdie Sons, MD sent at 10/19/2014  1:17 PM EDT ----- Labs all normal. Will le him know hen we get  Results of Holter Monitor. Should be back within a week.

## 2014-10-20 NOTE — Telephone Encounter (Signed)
I called the Cardiac Monitoring service back  And spoke with Manuela Schwartz who advised me that the patient would not be billed for the Holter Monitoring service because there was not enough data and patient would need to repeat test again.

## 2014-10-20 NOTE — Telephone Encounter (Signed)
-----   Message from April Loleta Chance, [place] sent at 10/20/2014  4:18 PM EDT ----- Regarding: Holter Monitoring Test The Holter Monitoring company the called office to let us know that the Holter test for Eduardo Poole was unable to process due to leads being loose. They said that only 14 minutes were recorded and that they were not clear.

## 2014-10-21 NOTE — Telephone Encounter (Signed)
Scheduled patient for nurse visit. Patient could not come in before 10/26/2014.

## 2014-10-21 NOTE — Telephone Encounter (Signed)
Ok. Please advise patient that he needs to come back in to have holter hooked up again.

## 2014-10-28 ENCOUNTER — Telehealth: Payer: Self-pay | Admitting: *Deleted

## 2014-10-28 ENCOUNTER — Telehealth: Payer: Self-pay | Admitting: Cardiovascular Disease

## 2014-10-28 DIAGNOSIS — R002 Palpitations: Secondary | ICD-10-CM | POA: Diagnosis not present

## 2014-10-28 MED ORDER — METOPROLOL TARTRATE 25 MG PO TABS: 25.0000 mg | ORAL_TABLET | Freq: Two times a day (BID) | ORAL | Status: DC

## 2014-10-28 NOTE — Telephone Encounter (Signed)
-----   Message from Birdie Sons, MD sent at 10/28/2014  1:32 PM EDT ----- Regarding: Holter monitor results Please advise patient that holter monitor shows he is having frequent PVCs (premature ventricular contractions) which is probably the cause of his palpitations. Needs to double dose of metoprolol to 1 full tablet twice a day. He should also follow up with his cardiologist. Please send copy of Holter report to his cardiologist.

## 2014-10-28 NOTE — Telephone Encounter (Signed)
Patient notified of results. Resent rx with new directions to pharmacy.

## 2014-10-28 NOTE — Telephone Encounter (Signed)
Pt reports Dr. Caryn Section, PCP put on 24 holter monitor, just got results back, noted occ PVCs. Dr. Caryn Section advised increase of metoprolol dose from 25mg  daily to 25mg  BID, instructed pt to f/u w/ [place]. No symptoms other than palpitations. Advised to increase metoprolol dose, call if any new concerns/worse before appt Confirmed 9/20 appt should be OK. Pt voiced understanding.  Will route to Dr. Claiborne Billings for [facility].

## 2014-10-28 NOTE — Telephone Encounter (Signed)
Pt called in stating that he went to his PCP and he found that the pt was having some PVC's. He instructed the pt to follow up with his Cardiologist and I informed him that he could see a APP on a day that Dr. Claiborne Billings was in the office. I offered him 9/20 with Lyda Jester and he felt that might be too long since he has been having this issue for 2 wks. Please f/u with the pt  Thanks

## 2014-10-30 NOTE — Telephone Encounter (Signed)
agree

## 2014-10-31 ENCOUNTER — Telehealth: Payer: Self-pay | Admitting: Family Medicine

## 2014-10-31 ENCOUNTER — Emergency Department: Payer: BLUE CROSS/BLUE SHIELD

## 2014-10-31 ENCOUNTER — Emergency Department
Admission: EM | Admit: 2014-10-31 | Discharge: 2014-10-31 | Disposition: A | Discharge status: Home / Self Care | Payer: BLUE CROSS/BLUE SHIELD | Attending: Emergency Medicine | Admitting: Emergency Medicine

## 2014-10-31 ENCOUNTER — Encounter: Payer: Self-pay | Admitting: Emergency Medicine

## 2014-10-31 DIAGNOSIS — I10 Essential (primary) hypertension: Secondary | ICD-10-CM | POA: Insufficient documentation

## 2014-10-31 DIAGNOSIS — Z7902 Long term (current) use of antithrombotics/antiplatelets: Secondary | ICD-10-CM | POA: Insufficient documentation

## 2014-10-31 DIAGNOSIS — I252 Old myocardial infarction: Secondary | ICD-10-CM | POA: Diagnosis not present

## 2014-10-31 DIAGNOSIS — Z79899 Other long term (current) drug therapy: Secondary | ICD-10-CM | POA: Insufficient documentation

## 2014-10-31 DIAGNOSIS — Z7982 Long term (current) use of aspirin: Secondary | ICD-10-CM | POA: Diagnosis not present

## 2014-10-31 DIAGNOSIS — R079 Chest pain, unspecified: Secondary | ICD-10-CM

## 2014-10-31 LAB — BASIC METABOLIC PANEL
Anion gap: 7 (ref 5–15)
BUN: 20 mg/dL (ref 6–20)
CALCIUM: 9.3 mg/dL (ref 8.9–10.3)
CO2: 25 mmol/L (ref 22–32)
Chloride: 106 mmol/L (ref 101–111)
Creatinine, Ser: 0.92 mg/dL (ref 0.61–1.24)
GFR calc Af Amer: >60 mL/min (ref >60)
GFR calc non Af Amer: >60 mL/min (ref >60)
GLUCOSE: 124 mg/dL — AB (ref 65–99)
Potassium: 4.2 mmol/L (ref 3.5–5.1)
SODIUM: 138 mmol/L (ref 135–145)

## 2014-10-31 LAB — CBC
HCT: 42.4 % (ref 40.0–52.0)
Hemoglobin: 14.4 g/dL (ref 13.0–18.0)
MCH: 30.3 pg (ref 26.0–34.0)
MCHC: 33.8 g/dL (ref 32.0–36.0)
MCV: 89.7 fL (ref 80.0–100.0)
Platelets: 294 10*3/uL (ref 150–440)
RBC: 4.73 MIL/uL (ref 4.40–5.90)
RDW: 13 % (ref 11.5–14.5)
WBC: 8.2 10*3/uL (ref 3.8–10.6)

## 2014-10-31 LAB — TROPONIN I

## 2014-10-31 MED ORDER — NITROGLYCERIN 2 % TD OINT
1.0000 [in_us] | TOPICAL_OINTMENT | Freq: Once | TRANSDERMAL | Status: AC
  Administered 2014-10-31: 1 [in_us] via TOPICAL
  Filled 2014-10-31: qty 1

## 2014-10-31 MED ORDER — LORAZEPAM 2 MG/ML IJ SOLN
0.5000 mg | Freq: Once | INTRAMUSCULAR | Status: AC
  Administered 2014-10-31: 0.5 mg via INTRAVENOUS
  Filled 2014-10-31: qty 1

## 2014-10-31 NOTE — Discharge Instructions (Signed)
Chest Pain (Nonspecific) °It is often hard to give a specific diagnosis for the cause of chest pain. There is always a chance that your pain could be related to something serious, such as a heart attack or a blood clot in the lungs. You need to follow up with your health care provider for further evaluation. °CAUSES  °· Heartburn. °· Pneumonia or bronchitis. °· Anxiety or stress. °· Inflammation around your heart (pericarditis) or lung (pleuritis or pleurisy). °· A blood clot in the lung. °· A collapsed lung (pneumothorax). It can develop suddenly on its own (spontaneous pneumothorax) or from trauma to the chest. °· Shingles infection (herpes zoster virus). °The chest wall is composed of bones, muscles, and cartilage. Any of these can be the source of the pain. °· The bones can be bruised by injury. °· The muscles or cartilage can be strained by coughing or overwork. °· The cartilage can be affected by inflammation and become sore (costochondritis). °DIAGNOSIS  °Lab tests or other studies may be needed to find the cause of your pain. Your health care provider may have you take a test called an ambulatory electrocardiogram (ECG). An ECG records your heartbeat patterns over a 24-hour period. You may also have other tests, such as: °· Transthoracic echocardiogram (TTE). During echocardiography, sound waves are used to evaluate how blood flows through your heart. °· Transesophageal echocardiogram (TEE). °· Cardiac monitoring. This allows your health care provider to monitor your heart rate and rhythm in real time. °· Holter monitor. This is a portable device that records your heartbeat and can help diagnose heart arrhythmias. It allows your health care provider to track your heart activity for several days, if needed. °· Stress tests by exercise or by giving medicine that makes the heart beat faster. °TREATMENT  °· Treatment depends on what may be causing your chest pain. Treatment may include: °¨ Acid blockers for  heartburn. °¨ Anti-inflammatory medicine. °¨ Pain medicine for inflammatory conditions. °¨ Antibiotics if an infection is present. °· You may be advised to change lifestyle habits. This includes stopping smoking and avoiding alcohol, caffeine, and chocolate. °· You may be advised to keep your head raised (elevated) when sleeping. This reduces the chance of acid going backward from your stomach into your esophagus. °Most of the time, nonspecific chest pain will improve within 2-3 days with rest and mild pain medicine.  °HOME CARE INSTRUCTIONS  °· If antibiotics were prescribed, take them as directed. Finish them even if you start to feel better. °· For the next few days, avoid physical activities that bring on chest pain. Continue physical activities as directed. °· Do not use any tobacco products, including cigarettes, chewing tobacco, or electronic cigarettes. °· Avoid drinking alcohol. °· Only take medicine as directed by your health care provider. °· Follow your health care provider's suggestions for further testing if your chest pain does not go away. °· Keep any follow-up appointments you made. If you do not go to an appointment, you could develop lasting (chronic) problems with pain. If there is any problem keeping an appointment, call to reschedule. °SEEK MEDICAL CARE IF:  °· Your chest pain does not go away, even after treatment. °· You have a rash with blisters on your chest. °· You have a fever. °SEEK IMMEDIATE MEDICAL CARE IF:  °· You have increased chest pain or pain that spreads to your arm, neck, jaw, back, or abdomen. °· You have shortness of breath. °· You have an increasing cough, or you cough   up blood.  You have severe back or abdominal pain.  You feel nauseous or vomit.  You have severe weakness.  You faint.  You have chills. This is an emergency. Do not wait to see if the pain will go away. Get medical help at once. Call your local emergency services (911 in [place] Do not drive  yourself to the hospital. MAKE SURE YOU:   Understand these instructions.  Will watch your condition.  Will get help right away if you are not doing well or get worse. Document Released: 11/14/2004 Document Revised: 02/09/2013 Document Reviewed: 09/10/2007 [facility] Patient Information 2015 [place], [place]. This information is not intended to replace advice given to you by your health care provider. Make sure you discuss any questions you have with your health care provider.  Please return immediately if condition worsens. Please contact her primary physician or the physician you were given for referral. If you have any specialist physicians involved in her treatment and plan please also contact them. Thank you for using [place] [facility] emergency [facility]. Continue all current medications.

## 2014-10-31 NOTE — ED Provider Notes (Signed)
Time Seen: Approximately 0 8:30  I have reviewed the triage notes  Chief Complaint: Chest Pain   History of Present Illness: Eduardo Poole is a 44 y.o. male who presents with some left-sided chest discomfort. Patient states he noticed it this morning while he was driving at work and started at [employer]. Patient states he took one nitroglycerin which gave him some relief and then took a second nitroglycerin at 8 AM. He states the pain was resolved by 8:15. Patient has a previous history of a small myocardial infarction and has a known minor lesion by heart catheterization. He states that this discomfort was very similar pain went up into his jaw in the upper part of his left arm. He states he was in to see his cardiologist on Friday for some frequent PVCs and had his metoprolol tartrate increased to 25 mg twice a day. He continues to be on blood thinner therapy. He also had his aspirin this morning. He states he had some nausea with no vomiting. He had a lot of belching and some mild sweating. He is currently pain-free during my evaluation. He states he does still continue to feel the PVCs to a not quite as frequent. He denies any current shortness of breath or back discomfort.   Past Medical History  Diagnosis Date  . DVT of lower extremity (deep venous thrombosis)     7 years ago, per pt was on lovenox/coumadin  . MI (myocardial infarction)   . TIA (transient ischemic attack) 07/23/2014    Patient Active Problem List   Diagnosis Date Noted  . History of TIA (transient ischemic attack) 08/04/2014  . Diastolic dysfunction 71/24/5809  . Anxiety 08/02/2014  . Depression 08/02/2014  . Diplopia 08/02/2014  . Blood pressure elevated 08/02/2014  . Fatigue 08/02/2014  . Headache 08/02/2014  . H/O adenomatous polyp of colon 08/02/2014  . History of DVT (deep vein thrombosis) 08/02/2014  . Hypertension 08/02/2014  . Insomnia 08/02/2014  . Lymphadenopathy 08/02/2014  . Sleep disorder  08/02/2014  . Chest wall pain 08/02/2014  . Pleurisy 08/02/2014  . ST elevation myocardial infarction (STEMI) involving right coronary artery with complication 98/33/8250  . ACS (acute coronary syndrome) 07/23/2014    Past Surgical History  Procedure Laterality Date  . Joint replacement    . Cardiac catheterization N/A 07/23/2014    Procedure: Left Heart Cath;  Surgeon: Troy Sine, MD;  Location: [place] [facility];  Service: Cardiovascular;  Laterality: N/A;  . Cardiac catheterization N/A 07/23/2014    Procedure: Coronary Balloon Angioplasty;  Surgeon: Troy Sine, MD;  Location: [place] [facility];  Service: Cardiovascular;  Laterality: N/A;  . Arthroplasty Right 07/09/2010  . Replacement total knee Right 2011    [facility]  . Nasal sinus surgery      Deviated Septum  . Meniscus repair Right 11/03/1995    Meniscus Lateral Tear: repaired by Dr. Sabra Heck at [facility]    Past Surgical History  Procedure Laterality Date  . Joint replacement    . Cardiac catheterization N/A 07/23/2014    Procedure: Left Heart Cath;  Surgeon: Troy Sine, MD;  Location: [place] [facility];  Service: Cardiovascular;  Laterality: N/A;  . Cardiac catheterization N/A 07/23/2014    Procedure: Coronary Balloon Angioplasty;  Surgeon: Troy Sine, MD;  Location: [place] [facility];  Service: Cardiovascular;  Laterality: N/A;  . Arthroplasty Right 07/09/2010  . Replacement total knee Right 2011  [facility]  . Nasal sinus surgery      Deviated Septum  . Meniscus repair Right 11/03/1995    Meniscus Lateral Tear: repaired by Dr. Sabra Heck at [place]  Name  Route  Sig  Dispense  Refill  . aspirin EC 81 MG EC tablet   Oral   Take 1 tablet (81 mg total) by mouth daily.         Marland Kitchen atorvastatin (LIPITOR) 80 MG tablet   Oral   Take 1 tablet (80 mg total) by mouth daily at 6 PM.   30 tablet   11   . metoprolol tartrate  (LOPRESSOR) 25 MG tablet   Oral   Take 1 tablet (25 mg total) by mouth 2 (two) times daily.   60 tablet   4   . nitroGLYCERIN (NITROSTAT) 0.4 MG SL tablet   Sublingual   Place 1 tablet (0.4 mg total) under the tongue every 5 (five) minutes as needed for chest pain.   25 tablet   12   . omeprazole (PRILOSEC) 20 MG capsule   Oral   Take 20 mg by mouth as needed (for heartburn).          . sertraline (ZOLOFT) 50 MG tablet      1/2 tablet daily for 6 days, then 1 tablet daily   30 tablet   3   . ticagrelor (BRILINTA) 90 MG TABS tablet   Oral   Take 1 tablet (90 mg total) by mouth 2 (two) times daily.   60 tablet   11   . diphenhydrAMINE-zinc acetate (BENADRYL) cream   Topical   Apply 1 application topically 3 (three) times daily as needed for itching.           Allergies:  Review of patient's allergies indicates no known allergies.  Family History: Family History  Problem Relation Age of Onset  . Hypertension Mother   . Arthritis Mother   . Diverticulitis Mother   . Gout Father   . Diabetes Paternal Grandfather     Social History: Social History  Substance Use Topics  . Smoking status: Never Smoker   . Smokeless tobacco: Former Systems developer    Types: Chew  . Alcohol Use: No     Review of Systems:   10 point review of systems was performed and was otherwise negative:  Constitutional: No fever Eyes: No visual disturbances ENT: No sore throat, ear pain Cardiac: No chest pain Respiratory: No shortness of breath, wheezing, or stridor Abdomen: No abdominal pain, no vomiting, No diarrhea Endocrine: No weight loss, No night sweats Extremities: No peripheral edema, cyanosis Skin: No rashes, easy bruising Neurologic: No focal weakness, trouble with speech or swollowing Urologic: No dysuria, Hematuria, or urinary frequency   Physical Exam:  ED Triage Vitals  Enc Vitals Group     BP 10/31/14 0829 128/72 mmHg     Pulse Rate 10/31/14 0829 63     Resp 10/31/14  0829 18     Temp 10/31/14 0829 98.4 F (36.9 C)     Temp Source 10/31/14 0829 Oral     SpO2 10/31/14 0829 96 %     Weight 10/31/14 0829 239 lb (108.41 kg)     Height 10/31/14 0829 5\' 10"  (1.778 m)     Head Cir --      Peak Flow --      Pain Score 10/31/14 0830 4     Pain Loc --  Pain Edu? --      Excl. in [place]? --     General: Awake , Alert , and Oriented times 3; GCS 15 Head: Normal cephalic , atraumatic Eyes: Pupils equal , round, reactive to light Nose/Throat: No nasal drainage, patent upper airway without erythema or exudate.  Neck: Supple, Full range of motion, No anterior adenopathy or palpable thyroid masses Lungs: Clear to ascultation without wheezes , rhonchi, or rales Heart: Regular rate, regular rhythm without murmurs , gallops , or rubs Abdomen: Soft, non tender without rebound, guarding , or rigidity; bowel sounds positive and symmetric in all 4 quadrants. No organomegaly .        Extremities: 2 plus symmetric pulses. No edema, clubbing or cyanosis Neurologic: normal ambulation, Motor symmetric without deficits, sensory intact Skin: warm, dry, no rashes   Labs:   All laboratory work was reviewed including any pertinent negatives or positives listed below:  Mars - Abnormal; Notable for the following:    Glucose, Bld 124 (*)    All other components within normal limits  CBC  TROPONIN I    EKG: ED ECG REPORT I, Daymon Larsen, the attending physician, personally viewed and interpreted this ECG.  Date: 10/31/2014 EKG Time: 827 Rate: 62 Rhythm: normal sinus rhythm QRS Axis: normal Intervals: normal ST/T Wave abnormalities: normal Conduction Disutrbances: none Narrative Interpretation: unremarkable    Radiology:  DG Chest 2 View (Final result) Result time: 10/31/14 08:47:42   Final result by Rad Results In Interface (10/31/14 08:47:42)   Narrative:   CLINICAL DATA: Chest pain.  EXAM: CHEST 2  VIEW  COMPARISON: CT 03/14/2014.  FINDINGS: Mediastinum and hilar structures normal. Lungs are clear. Heart size normal. No pleural effusion or pneumothorax. No acute bony abnormality .  IMPRESSION: No acute cardiopulmonary disease.     Procedure: Patient was placed on a continuous cardiac monitor with no indications of frequent PVCs      ED Course:  Differential includes all life-threatening causes for chest pain. This includes but is not exclusive to acute coronary syndrome, aortic dissection, pulmonary embolism, cardiac tamponade, community-acquired pneumonia, pericarditis, musculoskeletal chest wall pain, etc. Patient's stay here was otherwise uneventful and again he was placed on a continuous cardiac monitor with no PVCs present. Patient had nitroglycerin paste applied and had no other further episodes of chest discomfort. His EKG is normal and serial troponins were performed which were both negative. Review of the patient's heart catheterization results show minimal intra-vessel disease I reviewed the case with the patient's on-call cardiologist Dr. Burt Knack, we agree with outpatient management and they will contact the patient for treadmill testing. I felt he did not have an acute coronary syndrome. No felt was unlikely was life-threatening other causes such as aortic dissection, pulmonary embolism, cardiac tamponade , etc. He does have a history of anxiety and esophageal reflux disease which may be the causes for this particular discomfort.    Assessment:  Acute unspecified chest pain History of acute coronary syndrome    Plan: Patient was advised to return immediately if condition worsens. Patient was advised to follow up with her primary care physician or other specialized physicians involved and in their current assessment.             Daymon Larsen, MD 10/31/14 1257

## 2014-10-31 NOTE — ED Notes (Addendum)
Pt presents to ed with c/o chest pain started this am with shortness of breath. New dx of PVC's this past Friday.  Pt states he took two nitro tabs this am with relief.

## 2014-11-01 ENCOUNTER — Other Ambulatory Visit: Payer: Self-pay

## 2014-11-01 DIAGNOSIS — R079 Chest pain, unspecified: Secondary | ICD-10-CM

## 2014-11-02 ENCOUNTER — Ambulatory Visit (INDEPENDENT_AMBULATORY_CARE_PROVIDER_SITE_OTHER): Payer: BLUE CROSS/BLUE SHIELD | Admitting: Family Medicine

## 2014-11-02 ENCOUNTER — Telehealth (HOSPITAL_COMMUNITY): Payer: Self-pay | Admitting: *Deleted

## 2014-11-02 ENCOUNTER — Encounter: Payer: Self-pay | Admitting: Family Medicine

## 2014-11-02 VITALS — BP 122/80 | HR 74 | Temp 98.6°F | Resp 18 | Wt 240.0 lb

## 2014-11-02 DIAGNOSIS — I493 Ventricular premature depolarization: Secondary | ICD-10-CM | POA: Diagnosis not present

## 2014-11-02 DIAGNOSIS — R079 Chest pain, unspecified: Secondary | ICD-10-CM | POA: Diagnosis not present

## 2014-11-02 DIAGNOSIS — F419 Anxiety disorder, unspecified: Secondary | ICD-10-CM

## 2014-11-02 DIAGNOSIS — R002 Palpitations: Secondary | ICD-10-CM

## 2014-11-02 MED ORDER — ALPRAZOLAM 0.25 MG PO TABS: 0.2500 mg | ORAL_TABLET | ORAL | Status: DC | PRN

## 2014-11-02 MED ORDER — SERTRALINE HCL 100 MG PO TABS: 150.0000 mg | ORAL_TABLET | Freq: Every day | ORAL | Status: DC

## 2014-11-02 NOTE — Progress Notes (Signed)
Patient: Eduardo Poole Male    DOB: [birth year]   44 y.o.   MRN: [id number] Visit Date: 11/02/2014  Today's Provider: Lelon Huh, MD   Chief Complaint  Patient presents with  . Hyperlipidemia  . Hypertension   Subjective:    HPI   Follow up ER visit  Patient was seen in ER for  Chest pain on  10/31/2014. He reports good compliance with treatment. He reports this condition is Unchanged. Patient states he is still having chest pain daily. Last episode of chest pain was this morning. Patient is scheduled to have a stress test on  11/07/2014 and is to follow up with his Cardiologist on 11/08/2014.   ------------------------------------------------------------------------------------  Chest pain/palpitations He was seen here on 10/17/2014 with frequent episodes of feeling his heart race and pound. Holter monitor found frequent ectopic ventricular beats so we had him double his dose of metoprolol. At first episodes seemed to decrease in frequency, but had episode of chest tightness, diaphoresis and nausea on 9/12 as above. He took 2 nitroglycerine and went to ER where cardiac enzymes were negative and there were no acute EKG changes  He states he has been extremely anxious for several weeks, largely worrying about his health. Episodes of chest pain and racing heart seem to be associated with periods of anxiety.   He does have history GERD, but he states he has had no other reflux symptoms lately and that omeprazole has been very effective.   -------------------------------------------------------------------  Follow up Depression: Last office visit was 3 months ago and no changes were made. He had been taking 50mg  sertraline daily for several months.  Patient comes in today reporting that he has been more stressed lately due to his health. He took it upon himself to increase to 100mg  a day since last week.  He has also been seeing a psycholotist.     No Known  Allergies Previous Medications   ASPIRIN EC 81 MG EC TABLET    Take 1 tablet (81 mg total) by mouth daily.   ATORVASTATIN (LIPITOR) 80 MG TABLET    Take 1 tablet (80 mg total) by mouth daily at 6 PM.   METOPROLOL TARTRATE (LOPRESSOR) 25 MG TABLET    Take 1 tablet (25 mg total) by mouth 2 (two) times daily.   NITROGLYCERIN (NITROSTAT) 0.4 MG SL TABLET    Place 1 tablet (0.4 mg total) under the tongue every 5 (five) minutes as needed for chest pain.   OMEPRAZOLE (PRILOSEC) 20 MG CAPSULE    Take 20 mg by mouth as needed (for heartburn).    SERTRALINE (ZOLOFT) 50 MG TABLET    2 tablet daily   TICAGRELOR (BRILINTA) 90 MG TABS TABLET    Take 1 tablet (90 mg total) by mouth 2 (two) times daily.    Review of Systems  Constitutional: Negative for fever, chills and appetite change.  Respiratory: Negative for chest tightness, shortness of breath and wheezing.   Cardiovascular: Positive for chest pain and palpitations. Negative for leg swelling.  Gastrointestinal: Positive for nausea. Negative for vomiting and abdominal pain.    Social History  Substance Use Topics  . Smoking status: Never Smoker   . Smokeless tobacco: Former Systems developer    Types: Chew  . Alcohol Use: No   Objective:   BP 122/80 mmHg  Pulse 74  Temp(Src) 98.6 F (37 C) (Oral)  Resp 18  Wt 240 lb (108.863 kg)  SpO2 98%  Physical Exam  General Appearance:    Alert, cooperative, no distress  Eyes:    PERRL, conjunctiva/corneas clear, EOM's intact       Lungs:     Clear to auscultation bilaterally, respirations unlabored  Heart:    Regular rate and rhythm  Neurologic:   Awake, alert, oriented x 3. No apparent focal neurological           defect.          Assessment & Plan:     1. Palpitations Unclear if this is due to PVCs are anxiety, but PVCs seem to be reasonable well controlled on betablocker.   2. Anxiety Worsening. Will increase SSRI and may take prn alprazolam - sertraline (ZOLOFT) 100 MG tablet; Take 1.5 tablets  (150 mg total) by mouth daily. 1/2 tablet daily for 6 days, then 1 tablet daily  Dispense: 45 tablet; Refill: 1 - ALPRAZolam (XANAX) 0.25 MG tablet; Take 1 tablet (0.25 mg total) by mouth every 4 (four) hours as needed for anxiety.  Dispense: 30 tablet; Refill: 1  3. Chest pain, unspecified chest pain type Has upcoming stress test and cardiology follow up.   4. Frequent PVCs Tolerating increased dose of beta-blockers, per ER records PVCs were note seen while being monitored at recent visit. Continue current dose of metoprolol.        Lelon Huh, MD  [facility]

## 2014-11-02 NOTE — Telephone Encounter (Signed)
Patient given detailed instructions per Myocardial Perfusion Study Information Sheet for test on 11/07/14 at 1200. Patient notified to arrive 15 minutes early and that it is imperative to arrive on time for appointment to keep from having the test rescheduled.  If you need to cancel or reschedule your appointment, please call the office within 24 hours of your appointment. Failure to do so may result in a cancellation of your appointment, and a $50 no show fee. Patient verbalized understanding. Hubbard Robinson, RN

## 2014-11-04 ENCOUNTER — Encounter: Payer: Self-pay | Admitting: Family Medicine

## 2014-11-07 ENCOUNTER — Ambulatory Visit (HOSPITAL_COMMUNITY): Payer: BLUE CROSS/BLUE SHIELD | Attending: Cardiology

## 2014-11-07 DIAGNOSIS — R9439 Abnormal result of other cardiovascular function study: Secondary | ICD-10-CM | POA: Diagnosis not present

## 2014-11-07 DIAGNOSIS — R0609 Other forms of dyspnea: Secondary | ICD-10-CM | POA: Insufficient documentation

## 2014-11-07 DIAGNOSIS — R079 Chest pain, unspecified: Secondary | ICD-10-CM | POA: Insufficient documentation

## 2014-11-07 DIAGNOSIS — I10 Essential (primary) hypertension: Secondary | ICD-10-CM | POA: Diagnosis not present

## 2014-11-07 DIAGNOSIS — I517 Cardiomegaly: Secondary | ICD-10-CM | POA: Diagnosis not present

## 2014-11-07 DIAGNOSIS — R002 Palpitations: Secondary | ICD-10-CM | POA: Insufficient documentation

## 2014-11-07 LAB — MYOCARDIAL PERFUSION IMAGING
CHL CUP MPHR: 176 {beats}/min
CHL CUP NUCLEAR SDS: 2
CHL CUP NUCLEAR SRS: 3
CHL CUP NUCLEAR SSS: 5
Estimated workload: 10.7 METS
Exercise duration (min): 9 min
Exercise duration (sec): 31 s
LV sys vol: 59 mL
LVDIAVOL: 126 mL
Peak HR: 157 {beats}/min
Percent HR: 1 %
RATE: 0.4
RPE: 16
Rest HR: 64 {beats}/min
TID: 0.95

## 2014-11-07 MED ORDER — TECHNETIUM TC 99M SESTAMIBI GENERIC - CARDIOLITE
30.2000 | Freq: Once | INTRAVENOUS | Status: AC | PRN
  Administered 2014-11-07: 30.2 via INTRAVENOUS

## 2014-11-07 MED ORDER — TECHNETIUM TC 99M SESTAMIBI GENERIC - CARDIOLITE
10.8000 | Freq: Once | INTRAVENOUS | Status: AC | PRN
  Administered 2014-11-07: 11 via INTRAVENOUS

## 2014-11-08 ENCOUNTER — Ambulatory Visit (INDEPENDENT_AMBULATORY_CARE_PROVIDER_SITE_OTHER): Payer: BLUE CROSS/BLUE SHIELD | Admitting: Cardiology

## 2014-11-08 ENCOUNTER — Encounter: Payer: Self-pay | Admitting: Cardiology

## 2014-11-08 ENCOUNTER — Encounter: Payer: Self-pay | Admitting: *Deleted

## 2014-11-08 VITALS — BP 120/82 | HR 67 | Ht 70.0 in | Wt 240.3 lb

## 2014-11-08 DIAGNOSIS — R9439 Abnormal result of other cardiovascular function study: Secondary | ICD-10-CM

## 2014-11-08 DIAGNOSIS — Z01818 Encounter for other preprocedural examination: Secondary | ICD-10-CM | POA: Diagnosis not present

## 2014-11-08 NOTE — Progress Notes (Signed)
11/08/2014 Eduardo Poole   1970-12-20  [id number]  Primary Physician Lelon Huh, MD Primary Cardiologist: Dr. Claiborne Billings   Reason for Visit/CC: Unstable Angina/ Abnormal NST  HPI:  The patient is a 44 y/o male, followed by Dr. Claiborne Billings with a h/o CAD and remote DVT more than 8 years ago which was treated with Coumadin. He was recently admitted to [facility] in June of this year after presenting from [facility] as a CODE STEMI. EKG showed mild inferior ST elevations. Troponin was mildly elevated at 0.06. Also while in the ER, he was noted to have transient TIA like symptoms with brief slurring of speech and unilateral upper extremity numbness/ weakness. This only lasted several min. STAT CT of head showed no acute CVA. He was transported urgently to [facility] for further care and emergent [facility]. On arrival to [facility], he was still with chest discomfort. No further neurological deficits. Visualization of the left coronary system showed widely patent vessels. The RCA appeared to have ~30% mid stenosis with good flow but with evidence of clot distally. Angioplasty was completed. It was felt that perhaps he had palque that had rupture and that his clot had resolved after dose of Angiomax. There was also question of possible embolization of clot to cerebral vasculature and that he suffered a possible TIA. Left ventriculography showed normal LVF and normal appearing aortic anatomy. He was continued on Angiomax and started on ASA and Brilinta. Echocardiogram revealed ejection fraction of 60-65% with normal wall motion. Grade 1 diastolic dysfunction was noted. Borderline increased LV pressure. No source of emboli was identified. Carotid Doppler showed bilateral intimal wall thickening CCA. 1-39% ICA stenosis. Vertebral artery flow is antegrade. ICA/CCA ratio: R-1.0 L-0.79.   He was recently seen by his PCP on 10/19/2014 with complaint of palpitations. He was evaluated with a cardiac event monitor and was noted to have  frequent PVCs. He was instructed to make an appointment in our office regarding this however, 4 days later he presented to the [facility] emergency department with complaints of severe recurrent substernal chest pressure consistent with his prior angina. Symptoms would occur off and on at rest. He reports that he was evaluated in the ED but was discharged home the same day. Troponins were negative x 2. It was recommended that he undergo an outpatient nuclear stress test. His was performed yesterday, 11/07/2014. This was interpreted as intermediate risk stress nuclear study with a large, severe, partially reversible inferior lateral defect consistent with prior infarct and mild to moderate peri-infarct ischemia. EF was 53% with hypokinesis of the inferior lateral wall. Note: His 2-D echocardiogram in June of this year did not show any wall motion abnormalities. EF at that time was 60-65%.  Since being evaluated in the ED, he notes recurrence of intermittent chest discomfort relieved with nitroglycerin. Described as substernal and pressure/tightness sensation. He notes associated nausea and diaphoresis. In the ED today he is currently chest pain-free.       Current Outpatient Prescriptions  Medication Sig Dispense Refill  . ALPRAZolam (XANAX) 0.25 MG tablet Take 1 tablet (0.25 mg total) by mouth every 4 (four) hours as needed for anxiety. 30 tablet 1  . aspirin EC 81 MG EC tablet Take 1 tablet (81 mg total) by mouth daily.    Marland Kitchen atorvastatin (LIPITOR) 80 MG tablet Take 1 tablet (80 mg total) by mouth daily at 6 PM. 30 tablet 11  . metoprolol tartrate (LOPRESSOR) 25 MG tablet Take 1 tablet (25 mg total) by  mouth 2 (two) times daily. 60 tablet 4  . nitroGLYCERIN (NITROSTAT) 0.4 MG SL tablet Place 1 tablet (0.4 mg total) under the tongue every 5 (five) minutes as needed for chest pain. 25 tablet 12  . omeprazole (PRILOSEC) 20 MG capsule Take 20 mg by mouth as needed (for heartburn).     . sertraline  (ZOLOFT) 100 MG tablet Take 100 mg by mouth daily.    . ticagrelor (BRILINTA) 90 MG TABS tablet Take 1 tablet (90 mg total) by mouth 2 (two) times daily. 60 tablet 11   No current facility-administered medications for this visit.    No Known Allergies  Social History   Social History  . Marital Status: Married    Spouse Name: N/A  . Number of Children: 2  . Years of Education: 12   Occupational History  . Employed     Works at [employer]  . Smoking status: Never Smoker   . Smokeless tobacco: Former Systems developer    Types: Chew  . Alcohol Use: No  . Drug Use: No  . Sexual Activity: Not on file   Other Topics Concern  . Not on file   Social History Narrative     Review of Systems: General: negative for chills, fever, night sweats or weight changes.  Cardiovascular: negative for chest pain, dyspnea on exertion, edema, orthopnea, palpitations, paroxysmal nocturnal dyspnea or shortness of breath Dermatological: negative for rash Respiratory: negative for cough or wheezing Urologic: negative for hematuria Abdominal: negative for nausea, vomiting, diarrhea, bright red blood per rectum, melena, or hematemesis Neurologic: negative for visual changes, syncope, or dizziness All other systems reviewed and are otherwise negative except as noted above.    Blood pressure 120/82, pulse 67, height 5\' 10"  (1.778 m), weight 240 lb 4.8 oz (108.999 kg).  General appearance: alert, cooperative and no distress Neck: no carotid bruit and no JVD Lungs: clear to auscultation bilaterally Heart: regular rate and rhythm, S1, S2 normal, no murmur, click, rub or gallop Extremities: no LEE Pulses: 2+ and symmetric Skin: warm and dry Neurologic: Grossly normal  ASSESSMENT AND PLAN:   1. Unstable Angina: has known CAD s/p STEMI treated with angioplasty to the RCA in June, 2016. He has had recurrent symptoms of chest pain c/w prior angina and development of frequent  PVCs. NST yesterday is intermediate risk with a large, severe, partially reversible inferior lateral wall defect c/w prior infarct and mild to moderate peri-infarct ischemia. His EF is mildly reduced compared to 3 months ago and stress test suggest hypokinesis of the inferior wall, not seen on prior 2D echo. Given his history, symptoms and intermediate risk NST, will schedule for an outpatient diagnostic LHC to re-evaluate. For now continue medical therapy with ASA, Brilinta and metoprolol.  2. PVCs: recent cardiac monitor ordered by PCP demonstrated frequent PVCs. He notes full compliance with Metroprolol. Heart rate is currently in the 60s. He denies any excessive caffeine. Given recent chest pain and abnormal nuclear stress, will order a LHC to  rule out ischemia which could potentially be causing this PVCs.  PLAN  Plan for diagnostic LHC with Dr. Claiborne Billings later this week. Will obtain pre-cath labs (CBC, BMP).  Lyda Jester PA-C 11/08/2014 4:18 PM

## 2014-11-08 NOTE — Patient Instructions (Signed)
Your physician has requested that you have a cardiac catheterization. Cardiac catheterization is used to diagnose and/or treat various heart conditions. Doctors may recommend this procedure for a number of different reasons. The most common reason is to evaluate chest pain. Chest pain can be a symptom of coronary artery disease (CAD), and cardiac catheterization can show whether plaque is narrowing or blocking your heart's arteries. This procedure is also used to evaluate the valves, as well as measure the blood flow and oxygen levels in different parts of your heart. For further information please visit [email or web address]. Please follow instruction sheet, as given.   Your physician recommends that you return for lab work TOMORROW

## 2014-11-09 ENCOUNTER — Telehealth: Payer: Self-pay | Admitting: Cardiovascular Disease

## 2014-11-09 ENCOUNTER — Encounter: Payer: Self-pay | Admitting: Cardiology

## 2014-11-09 LAB — BASIC METABOLIC PANEL
BUN/Creatinine Ratio: 15 (ref 9–20)
BUN: 16 mg/dL (ref 6–24)
CALCIUM: 9.6 mg/dL (ref 8.7–10.2)
CO2: 25 mmol/L (ref 18–29)
CREATININE: 1.06 mg/dL (ref 0.76–1.27)
Chloride: 101 mmol/L (ref 97–108)
GFR calc Af Amer: 98 mL/min/{1.73_m2} (ref >59)
GFR, EST NON AFRICAN AMERICAN: 85 mL/min/{1.73_m2} (ref >59)
GLUCOSE: 105 mg/dL — AB (ref 65–99)
POTASSIUM: 4.7 mmol/L (ref 3.5–5.2)
SODIUM: 141 mmol/L (ref 134–144)

## 2014-11-09 LAB — CBC
HEMATOCRIT: 41.8 % (ref 37.5–51.0)
HEMOGLOBIN: 14.3 g/dL (ref 12.6–17.7)
MCH: 30.7 pg (ref 26.6–33.0)
MCHC: 34.2 g/dL (ref 31.5–35.7)
MCV: 90 fL (ref 79–97)
Platelets: 290 10*3/uL (ref 150–379)
RBC: 4.66 x10E6/uL (ref 4.14–5.80)
RDW: 13.6 % (ref 12.3–15.4)
WBC: 7.9 10*3/uL (ref 3.4–10.8)

## 2014-11-09 LAB — PROTIME-INR
INR: 1 (ref 0.8–1.2)
PROTHROMBIN TIME: 10.8 s (ref 9.1–12.0)

## 2014-11-09 NOTE — Telephone Encounter (Signed)
Joy has some STAT labs for the pt

## 2014-11-09 NOTE — Telephone Encounter (Signed)
[facility] called, notified they had stat labs to inform us of - I got faxed copy of these which will be given to medical records for scanning.  Here is a summary of results:  CBC, Platelet, No Diff WBC  7.9 RBC  4.66 Hbg  14.3 Hct  41.8 MCV   90 MCH  30.7 MCHC  34.2 RDW  13.6 Platelets  700  Basic Metabolic Panel (8)   Glucose, Serum  105 BUN    16 Creatinine, Serum  1.06 EGFR NonAfrican Am  85 EGFR African Am  98 BUN/Creatinine Ratio  15 Sodium, Serum  141 Potassium, Serum   4.7 Chloride, Serum  101 Carbon Dioxide, Total  25 Calcium, Serum  9.6  Prothrombin Time INR    1.0 Prothrombin Time  10.8

## 2014-11-10 ENCOUNTER — Encounter (HOSPITAL_COMMUNITY): Payer: BLUE CROSS/BLUE SHIELD

## 2014-11-10 ENCOUNTER — Encounter (HOSPITAL_COMMUNITY)
Admission: RE | Disposition: A | Discharge status: Home / Self Care | Payer: Self-pay | Source: Ambulatory Visit | Attending: Cardiovascular Disease

## 2014-11-10 ENCOUNTER — Encounter (HOSPITAL_COMMUNITY): Payer: Self-pay | Admitting: Cardiovascular Disease

## 2014-11-10 ENCOUNTER — Ambulatory Visit (HOSPITAL_COMMUNITY)
Admission: RE | Admit: 2014-11-10 | Discharge: 2014-11-10 | Disposition: A | Discharge status: Home / Self Care | Payer: BLUE CROSS/BLUE SHIELD | Source: Ambulatory Visit | Attending: Cardiovascular Disease | Admitting: Cardiovascular Disease

## 2014-11-10 DIAGNOSIS — Z01818 Encounter for other preprocedural examination: Secondary | ICD-10-CM

## 2014-11-10 DIAGNOSIS — I25110 Atherosclerotic heart disease of native coronary artery with unstable angina pectoris: Secondary | ICD-10-CM | POA: Diagnosis not present

## 2014-11-10 DIAGNOSIS — R9439 Abnormal result of other cardiovascular function study: Secondary | ICD-10-CM | POA: Diagnosis not present

## 2014-11-10 DIAGNOSIS — I252 Old myocardial infarction: Secondary | ICD-10-CM | POA: Insufficient documentation

## 2014-11-10 DIAGNOSIS — Z8673 Personal history of transient ischemic attack (TIA), and cerebral infarction without residual deficits: Secondary | ICD-10-CM | POA: Diagnosis not present

## 2014-11-10 DIAGNOSIS — Z9861 Coronary angioplasty status: Secondary | ICD-10-CM | POA: Diagnosis not present

## 2014-11-10 DIAGNOSIS — I493 Ventricular premature depolarization: Secondary | ICD-10-CM | POA: Diagnosis not present

## 2014-11-10 DIAGNOSIS — Z7982 Long term (current) use of aspirin: Secondary | ICD-10-CM | POA: Diagnosis not present

## 2014-11-10 DIAGNOSIS — Z86718 Personal history of other venous thrombosis and embolism: Secondary | ICD-10-CM | POA: Diagnosis not present

## 2014-11-10 HISTORY — PX: CARDIAC CATHETERIZATION: SHX172

## 2014-11-10 SURGERY — LEFT HEART CATH AND CORONARY ANGIOGRAPHY
Anesthesia: LOCAL

## 2014-11-10 MED ORDER — SODIUM CHLORIDE 0.9 % IJ SOLN: 3.0000 mL | INTRAMUSCULAR | Status: DC | PRN

## 2014-11-10 MED ORDER — FENTANYL CITRATE (PF) 100 MCG/2ML IJ SOLN
INTRAMUSCULAR | Status: DC | PRN
  Administered 2014-11-10: 50 ug via INTRAVENOUS

## 2014-11-10 MED ORDER — LIDOCAINE HCL (PF) 1 % IJ SOLN
INTRAMUSCULAR | Status: AC
  Filled 2014-11-10: qty 30

## 2014-11-10 MED ORDER — MIDAZOLAM HCL 2 MG/2ML IJ SOLN
INTRAMUSCULAR | Status: AC
  Filled 2014-11-10: qty 4

## 2014-11-10 MED ORDER — NITROGLYCERIN 1 MG/10 ML FOR IR/CATH LAB
INTRA_ARTERIAL | Status: DC | PRN
  Administered 2014-11-10: 11:00:00

## 2014-11-10 MED ORDER — IOHEXOL 350 MG/ML SOLN
INTRAVENOUS | Status: DC | PRN
  Administered 2014-11-10: 70 mL via INTRA_ARTERIAL

## 2014-11-10 MED ORDER — VERAPAMIL HCL 2.5 MG/ML IV SOLN
INTRAVENOUS | Status: AC
  Filled 2014-11-10: qty 2

## 2014-11-10 MED ORDER — ACETAMINOPHEN 325 MG PO TABS: 650.0000 mg | ORAL_TABLET | ORAL | Status: DC | PRN

## 2014-11-10 MED ORDER — SODIUM CHLORIDE 0.9 % IV SOLN
INTRAVENOUS | Status: DC | PRN
  Administered 2014-11-10: 75 mL/h via INTRAVENOUS

## 2014-11-10 MED ORDER — SODIUM CHLORIDE 0.9 % IV SOLN
INTRAVENOUS | Status: DC
  Administered 2014-11-10: 08:00:00 via INTRAVENOUS

## 2014-11-10 MED ORDER — ONDANSETRON HCL 4 MG/2ML IJ SOLN: 4.0000 mg | Freq: Four times a day (QID) | INTRAMUSCULAR | Status: DC | PRN

## 2014-11-10 MED ORDER — VERAPAMIL HCL 2.5 MG/ML IV SOLN
INTRA_ARTERIAL | Status: DC | PRN
  Administered 2014-11-10: 10:00:00 via INTRA_ARTERIAL

## 2014-11-10 MED ORDER — SODIUM CHLORIDE 0.9 % WEIGHT BASED INFUSION: 3.0000 mL/kg/h | INTRAVENOUS | Status: DC

## 2014-11-10 MED ORDER — SODIUM CHLORIDE 0.9 % IJ SOLN: 3.0000 mL | Freq: Two times a day (BID) | INTRAMUSCULAR | Status: DC

## 2014-11-10 MED ORDER — DIAZEPAM 5 MG PO TABS: 5.0000 mg | ORAL_TABLET | ORAL | Status: DC | PRN

## 2014-11-10 MED ORDER — MIDAZOLAM HCL 2 MG/2ML IJ SOLN
INTRAMUSCULAR | Status: DC | PRN
  Administered 2014-11-10: 2 mg via INTRAVENOUS

## 2014-11-10 MED ORDER — HEPARIN SODIUM (PORCINE) 1000 UNIT/ML IJ SOLN
INTRAMUSCULAR | Status: DC | PRN
  Administered 2014-11-10: 5000 [IU] via INTRAVENOUS

## 2014-11-10 MED ORDER — DIAZEPAM 5 MG PO TABS
ORAL_TABLET | ORAL | Status: AC
  Filled 2014-11-10: qty 1

## 2014-11-10 MED ORDER — FENTANYL CITRATE (PF) 100 MCG/2ML IJ SOLN
INTRAMUSCULAR | Status: AC
  Filled 2014-11-10: qty 4

## 2014-11-10 MED ORDER — DIAZEPAM 5 MG PO TABS
5.0000 mg | ORAL_TABLET | ORAL | Status: AC
  Administered 2014-11-10: 5 mg via ORAL

## 2014-11-10 MED ORDER — SODIUM CHLORIDE 0.9 % IV SOLN: 250.0000 mL | INTRAVENOUS | Status: DC | PRN

## 2014-11-10 SURGICAL SUPPLY — 11 items
CATH INFINITI 5FR ANG PIGTAIL (CATHETERS) ×3 IMPLANT
CATH INFINITI JR4 5F (CATHETERS) ×3 IMPLANT
CATH OPTITORQUE TIG 4.0 5F (CATHETERS) ×3 IMPLANT
DEVICE RAD COMP TR BAND LRG (VASCULAR PRODUCTS) ×3 IMPLANT
GLIDESHEATH SLEND A-KIT 6F 22G (SHEATH) ×3 IMPLANT
KIT HEART LEFT (KITS) ×3 IMPLANT
PACK CARDIAC CATHETERIZATION (CUSTOM PROCEDURE TRAY) ×3 IMPLANT
SYR MEDRAD MARK V 150ML (SYRINGE) ×3 IMPLANT
TRANSDUCER W/STOPCOCK (MISCELLANEOUS) ×3 IMPLANT
TUBING CIL FLEX 10 FLL-RA (TUBING) ×3 IMPLANT
WIRE SAFE-T 1.5MM-J .035X260CM (WIRE) ×3 IMPLANT

## 2014-11-10 NOTE — H&P (View-Only) (Signed)
11/08/2014 Eduardo Poole   06-15-1970  [id number]  Primary Physician Eduardo Huh, MD Primary Cardiologist: Dr. Claiborne Poole   Reason for Visit/CC: Unstable Angina/ Abnormal NST  HPI:  The patient is a 44 y/o male, followed by Dr. Claiborne Poole with a h/o CAD and remote DVT more than 8 years ago which was treated with Coumadin. He was recently admitted to [facility] in June of this year after presenting from [facility] as a CODE STEMI. EKG showed mild inferior ST elevations. Troponin was mildly elevated at 0.06. Also while in the ER, he was noted to have transient TIA like symptoms with brief slurring of speech and unilateral upper extremity numbness/ weakness. This only lasted several min. STAT CT of head showed no acute CVA. He was transported urgently to [facility] for further care and emergent [facility]. On arrival to [facility], he was still with chest discomfort. No further neurological deficits. Visualization of the left coronary system showed widely patent vessels. The RCA appeared to have ~30% mid stenosis with good flow but with evidence of clot distally. Angioplasty was completed. It was felt that perhaps he had palque that had rupture and that his clot had resolved after dose of Angiomax. There was also question of possible embolization of clot to cerebral vasculature and that he suffered a possible TIA. Left ventriculography showed normal LVF and normal appearing aortic anatomy. He was continued on Angiomax and started on ASA and Brilinta. Echocardiogram revealed ejection fraction of 60-65% with normal wall motion. Grade 1 diastolic dysfunction was noted. Borderline increased LV pressure. No source of emboli was identified. Carotid Doppler showed bilateral intimal wall thickening CCA. 1-39% ICA stenosis. Vertebral artery flow is antegrade. ICA/CCA ratio: R-1.0 L-0.79.   He was recently seen by his PCP on 10/19/2014 with complaint of palpitations. He was evaluated with a cardiac event monitor and was noted to have  frequent PVCs. He was instructed to make an appointment in our office regarding this however, 4 days later he presented to the [facility] emergency department with complaints of severe recurrent substernal chest pressure consistent with his prior angina. Symptoms would occur off and on at rest. He reports that he was evaluated in the ED but was discharged home the same day. Troponins were negative x 2. It was recommended that he undergo an outpatient nuclear stress test. His was performed yesterday, 11/07/2014. This was interpreted as intermediate risk stress nuclear study with a large, severe, partially reversible inferior lateral defect consistent with prior infarct and mild to moderate peri-infarct ischemia. EF was 53% with hypokinesis of the inferior lateral wall. Note: His 2-D echocardiogram in June of this year did not show any wall motion abnormalities. EF at that time was 60-65%.  Since being evaluated in the ED, he notes recurrence of intermittent chest discomfort relieved with nitroglycerin. Described as substernal and pressure/tightness sensation. He notes associated nausea and diaphoresis. In the ED today he is currently chest pain-free.       Current Outpatient Prescriptions  Medication Sig Dispense Refill  . ALPRAZolam (XANAX) 0.25 MG tablet Take 1 tablet (0.25 mg total) by mouth every 4 (four) hours as needed for anxiety. 30 tablet 1  . aspirin EC 81 MG EC tablet Take 1 tablet (81 mg total) by mouth daily.    Marland Kitchen atorvastatin (LIPITOR) 80 MG tablet Take 1 tablet (80 mg total) by mouth daily at 6 PM. 30 tablet 11  . metoprolol tartrate (LOPRESSOR) 25 MG tablet Take 1 tablet (25 mg total) by  mouth 2 (two) times daily. 60 tablet 4  . nitroGLYCERIN (NITROSTAT) 0.4 MG SL tablet Place 1 tablet (0.4 mg total) under the tongue every 5 (five) minutes as needed for chest pain. 25 tablet 12  . omeprazole (PRILOSEC) 20 MG capsule Take 20 mg by mouth as needed (for heartburn).     . sertraline  (ZOLOFT) 100 MG tablet Take 100 mg by mouth daily.    . ticagrelor (BRILINTA) 90 MG TABS tablet Take 1 tablet (90 mg total) by mouth 2 (two) times daily. 60 tablet 11   No current facility-administered medications for this visit.    No Known Allergies  Social History   Social History  . Marital Status: Married    Spouse Name: N/A  . Number of Children: 2  . Years of Education: 12   Occupational History  . Employed     Works at [employer]  . Smoking status: Never Smoker   . Smokeless tobacco: Former Systems developer    Types: Chew  . Alcohol Use: No  . Drug Use: No  . Sexual Activity: Not on file   Other Topics Concern  . Not on file   Social History Narrative     Review of Systems: General: negative for chills, fever, night sweats or weight changes.  Cardiovascular: negative for chest pain, dyspnea on exertion, edema, orthopnea, palpitations, paroxysmal nocturnal dyspnea or shortness of breath Dermatological: negative for rash Respiratory: negative for cough or wheezing Urologic: negative for hematuria Abdominal: negative for nausea, vomiting, diarrhea, bright red blood per rectum, melena, or hematemesis Neurologic: negative for visual changes, syncope, or dizziness All other systems reviewed and are otherwise negative except as noted above.    Blood pressure 120/82, pulse 67, height 5\' 10"  (1.778 m), weight 240 lb 4.8 oz (108.999 kg).  General appearance: alert, cooperative and no distress Neck: no carotid bruit and no JVD Lungs: clear to auscultation bilaterally Heart: regular rate and rhythm, S1, S2 normal, no murmur, click, rub or gallop Extremities: no LEE Pulses: 2+ and symmetric Skin: warm and dry Neurologic: Grossly normal  ASSESSMENT AND PLAN:   1. Unstable Angina: has known CAD s/p STEMI treated with angioplasty to the RCA in June, 2016. He has had recurrent symptoms of chest pain c/w prior angina and development of frequent  PVCs. NST yesterday is intermediate risk with a large, severe, partially reversible inferior lateral wall defect c/w prior infarct and mild to moderate peri-infarct ischemia. His EF is mildly reduced compared to 3 months ago and stress test suggest hypokinesis of the inferior wall, not seen on prior 2D echo. Given his history, symptoms and intermediate risk NST, will schedule for an outpatient diagnostic LHC to re-evaluate. For now continue medical therapy with ASA, Brilinta and metoprolol.  2. PVCs: recent cardiac monitor ordered by PCP demonstrated frequent PVCs. He notes full compliance with Metroprolol. Heart rate is currently in the 60s. He denies any excessive caffeine. Given recent chest pain and abnormal nuclear stress, will order a LHC to  rule out ischemia which could potentially be causing this PVCs.  PLAN  Plan for diagnostic LHC with Dr. Claiborne Poole later this week. Will obtain pre-cath labs (CBC, BMP).  Lyda Jester PA-C 11/08/2014 4:18 PM

## 2014-11-10 NOTE — Interval H&P Note (Signed)
Cath Lab Visit (complete for each Cath Lab visit)  Clinical Evaluation Leading to the Procedure:   ACS: No.  Non-ACS:    Anginal Classification: CCS III  Anti-ischemic medical therapy: Maximal Therapy (2 or more classes of medications)  Non-Invasive Test Results: Intermediate-risk stress test findings: cardiac mortality 1-3%/year  Prior CABG: No previous CABG      History and Physical Interval Note:  11/10/2014 10:15 AM  Eduardo Poole  has presented today for surgery, with the diagnosis of abnormal stress test  The various methods of treatment have been discussed with the patient and family. After consideration of risks, benefits and other options for treatment, the patient has consented to  Procedure(s): Left Heart Cath and Coronary Angiography (N/A) as a surgical intervention .  The patient's history has been reviewed, patient examined, no change in status, stable for surgery.  I have reviewed the patient's chart and labs.  Questions were answered to the patient's satisfaction.     KELLY,THOMAS A

## 2014-11-10 NOTE — Discharge Instructions (Signed)
Radial Site Care °Refer to this sheet in the next few weeks. These instructions provide you with information on caring for yourself after your procedure. Your caregiver may also give you more specific instructions. Your treatment has been planned according to current medical practices, but problems sometimes occur. Call your caregiver if you have any problems or questions after your procedure. °HOME CARE INSTRUCTIONS °· You may shower the day after the procedure. Remove the bandage (dressing) and gently wash the site with plain soap and water. Gently pat the site dry. °· Do not apply powder or lotion to the site. °· Do not submerge the affected site in water for 3 to 5 days. °· Inspect the site at least twice daily. °· Do not flex or bend the affected arm for 24 hours. °· No lifting over 5 pounds (2.3 kg) for 5 days after your procedure. °· Do not drive home if you are discharged the same day of the procedure. Have someone else drive you. °· You may drive 24 hours after the procedure unless otherwise instructed by your caregiver. °· Do not operate machinery or power tools for 24 hours. °· A responsible adult should be with you for the first 24 hours after you arrive home. °What to expect: °· Any bruising will usually fade within 1 to 2 weeks. °· Blood that collects in the tissue (hematoma) may be painful to the touch. It should usually decrease in size and tenderness within 1 to 2 weeks. °SEEK IMMEDIATE MEDICAL CARE IF: °· You have unusual pain at the radial site. °· You have redness, warmth, swelling, or pain at the radial site. °· You have drainage (other than a small amount of blood on the dressing). °· You have chills. °· You have a fever or persistent symptoms for more than 72 hours. °· You have a fever and your symptoms suddenly get worse. °· Your arm becomes pale, cool, tingly, or numb. °· You have heavy bleeding from the site. Hold pressure on the site. °Document Released: 03/09/2010 Document Revised:  04/29/2011 Document Reviewed: 03/09/2010 °ExitCare® Patient Information ©2015 [employer]. This information is not intended to replace advice given to you by your health care provider. Make sure you discuss any questions you have with your health care provider. ° °

## 2014-11-10 NOTE — Telephone Encounter (Signed)
Can this encounter be closed?

## 2014-11-20 NOTE — Telephone Encounter (Signed)
Labs ok.

## 2014-12-02 ENCOUNTER — Encounter: Payer: Self-pay | Admitting: Family Medicine

## 2014-12-02 ENCOUNTER — Ambulatory Visit (INDEPENDENT_AMBULATORY_CARE_PROVIDER_SITE_OTHER): Payer: BLUE CROSS/BLUE SHIELD | Admitting: Family Medicine

## 2014-12-02 VITALS — BP 120/80 | HR 64 | Temp 98.7°F | Resp 16 | Wt 243.0 lb

## 2014-12-02 DIAGNOSIS — J0100 Acute maxillary sinusitis, unspecified: Secondary | ICD-10-CM | POA: Diagnosis not present

## 2014-12-02 DIAGNOSIS — I493 Ventricular premature depolarization: Secondary | ICD-10-CM

## 2014-12-02 DIAGNOSIS — F419 Anxiety disorder, unspecified: Secondary | ICD-10-CM | POA: Diagnosis not present

## 2014-12-02 DIAGNOSIS — Z23 Encounter for immunization: Secondary | ICD-10-CM

## 2014-12-02 MED ORDER — SERTRALINE HCL 100 MG PO TABS: 150.0000 mg | ORAL_TABLET | Freq: Every day | ORAL | Status: DC

## 2014-12-02 MED ORDER — AMOXICILLIN 500 MG PO CAPS: 1000.0000 mg | ORAL_CAPSULE | Freq: Two times a day (BID) | ORAL | Status: AC

## 2014-12-02 NOTE — Progress Notes (Signed)
Patient: [name] Male    DOB: [birth year]   44 y.o.   MRN: [id number] Visit Date: 12/02/2014  Today's Provider: Lelon Huh, MD   Chief Complaint  Patient presents with  . Follow-up  . Palpitations   Subjective:    HPI  Follow-up for palpitations from  11/02/2014 due to PVCs. He was doing reasonably well on metoprolol 25 at that time and has since had heart cath by his cardiologist and patient reports there were no significantly new findings. He continues on metoprolol and states he still feels occasional palpitations, but they don't bother him nearly as much since he knows what is causing him and that his cath was not concerning.   Anxiety: Since his last visit he has titrated sertraline to 150mg  a day and feels this has helped his anxiety significantly with no adverse effects.   Sinus pain: He states for the last 4-5 days he has had pain and pressure across the top of his head and besides his right nostril associated with dark and bloody sinus drainage. He has started Nasacort which help with congestion.   No Known Allergies Previous Medications   ALPRAZOLAM (XANAX) 0.25 MG TABLET    Take 1 tablet (0.25 mg total) by mouth every 4 (four) hours as needed for anxiety.   ASPIRIN EC 81 MG EC TABLET    Take 1 tablet (81 mg total) by mouth daily.   ATORVASTATIN (LIPITOR) 80 MG TABLET    Take 1 tablet (80 mg total) by mouth daily at 6 PM.   METOPROLOL TARTRATE (LOPRESSOR) 25 MG TABLET    Take 1 tablet (25 mg total) by mouth 2 (two) times daily.   NITROGLYCERIN (NITROSTAT) 0.4 MG SL TABLET    Place 1 tablet (0.4 mg total) under the tongue every 5 (five) minutes as needed for chest pain.   OMEPRAZOLE (PRILOSEC) 20 MG CAPSULE    Take 20 mg by mouth as needed (for heartburn).    SERTRALINE (ZOLOFT) 100 MG TABLET    Take 100 mg by mouth daily.   TICAGRELOR (BRILINTA) 90 MG TABS TABLET    Take 1 tablet (90 mg total) by mouth 2 (two) times daily.    Review of Systems  HENT:  Positive for ear pain, sinus pressure and tinnitus.        Right ear pain  Cardiovascular: Positive for palpitations. Negative for chest pain.  Musculoskeletal: Positive for back pain.       Started about a week ago  Neurological: Positive for dizziness, light-headedness and headaches.    Social History  Substance Use Topics  . Smoking status: Never Smoker   . Smokeless tobacco: Former Systems developer    Types: Chew  . Alcohol Use: No   Objective:   BP 120/80 mmHg  Pulse 64  Temp(Src) 98.7 F (37.1 C) (Oral)  Resp 16  Wt 243 lb (110.224 kg)  SpO2 96%  Physical Exam   ENT:    Tender right maxillary and frontal sinuses with moderate right nasal turbinate congestion. OP clear. No LAD  General Appearance:    Alert, cooperative, no distress  Eyes:    PERRL, conjunctiva/corneas clear, EOM's intact       Lungs:     Clear to auscultation bilaterally, respirations unlabored  Heart:    Regular rate and rhythm  Neurologic:   Awake, alert, oriented x 3. No apparent focal neurological           defect.  Assessment & Plan:     1. Anxiety Better with increase dose of sertraine - sertraline (ZOLOFT) 100 MG tablet; Take 1.5 tablets (150 mg total) by mouth daily.  Dispense: 45 tablet; Refill: 5  2. Frequent PVCs Adequately controlled with betablocker  3. Need for prophylactic vaccination and inoculation against influenza  - Flu Vaccine QUAD 36+ mos IM  4. Acute maxillary sinusitis, recurrence not specified  - amoxicillin (AMOXIL) 500 MG capsule; Take 2 capsules (1,000 mg total) by mouth 2 (two) times daily.  Dispense: 40 capsule; Refill: 0        Lelon Huh, MD  [facility]

## 2014-12-12 ENCOUNTER — Telehealth: Payer: Self-pay

## 2014-12-12 NOTE — Telephone Encounter (Signed)
Spoke with Vinnie Level and she states the 1st bill has been written off. The Monitoring service realized that it was blank and nothing recorded. She states patient should discard that bill. She also states that they have not sent a 2nd bill yet. They are working on an appeal from the insurance to get coverage. Vinnie Level states that what patient has is probably an explanation of benefits summay that list the charges for the monitoring service. She states usually insurance cover the Holter monitir test and if not the most a patient pays is about $295. Vinnie Level states that if patient has anymore questions, they should call them directly at [phone]. I advised Judson Roch Curator here at [facility]) of this message and the conversation with Vinnie Level. Judson Roch states she would call and inform patient wife Malachy Mood of this information.

## 2014-12-12 NOTE — Telephone Encounter (Signed)
Per Judson Roch (Glass blower/designer), patient wife Malachy Mood called stating that patient received 2 bills for the Holter monitor that was hooked up last month. Malachy Mood states the 1st time we hooked him up they weren't able to get a read because there was an error with the hook up. She states they weren't supposed to get a bill for this because of the error. Malachy Mood states the 2nd bill they received was for $600 because her insurance did not cover the test. Malachy Mood states that she did not know that this test was done outside the office. She thought that we did it here at [facility]. I called Vinnie Level at [facility] [employer] [facility] and left a message for her to call back. Per pt message on 10/20/2014 patient was not supposed to be billed for the 1st test because of the error (it was determined that the Holter monitor used had a malfunction and was not recording). Waiting on a return call from Vinnie Level to resolve this billing issue from the 1st hollter hook up. Vinnie Level number [phone]

## 2015-02-07 ENCOUNTER — Encounter: Payer: Self-pay | Admitting: Family Medicine

## 2015-02-07 ENCOUNTER — Ambulatory Visit
Admission: RE | Admit: 2015-02-07 | Discharge: 2015-02-07 | Disposition: A | Discharge status: Home / Self Care | Payer: BLUE CROSS/BLUE SHIELD | Source: Ambulatory Visit | Attending: Family Medicine | Admitting: Family Medicine

## 2015-02-07 ENCOUNTER — Ambulatory Visit (INDEPENDENT_AMBULATORY_CARE_PROVIDER_SITE_OTHER): Payer: BLUE CROSS/BLUE SHIELD | Admitting: Family Medicine

## 2015-02-07 VITALS — BP 120/80 | HR 70 | Temp 98.1°F | Resp 16 | Wt 248.0 lb

## 2015-02-07 DIAGNOSIS — M25511 Pain in right shoulder: Secondary | ICD-10-CM

## 2015-02-07 NOTE — Progress Notes (Signed)
Patient: Eduardo Poole Male    DOB: December 24, 1970   44 y.o.   MRN: [id number] Visit Date: 02/07/2015  Today's Provider: Lelon Huh, MD   Chief Complaint  Patient presents with  . Shoulder Pain    x 4 weeks   Subjective:    Shoulder Pain  The pain is present in the right shoulder. This is a new problem. Episode onset: started 4 weeks ago. There has been no history of extremity trauma. The problem occurs intermittently. The problem has been gradually worsening. Pertinent negatives include no fever, itching, joint swelling, limited range of motion, numbness, stiffness or tingling. Exacerbated by: being still. He has tried acetaminophen, movement and heat for the symptoms. The treatment provided no relief.  Patient states the pain is keeping him up at night. Does not recall any specific injury but aggravated yesterday lifting boxes. Taken tylenol only due to being on Brilinta. Tried ice and heat without relief. Seems to bother him more when he is at rest, and is minimal when he is active and using UEs.       No Known Allergies Previous Medications   ALPRAZOLAM (XANAX) 0.25 MG TABLET    Take 1 tablet (0.25 mg total) by mouth every 4 (four) hours as needed for anxiety.   ASPIRIN EC 81 MG EC TABLET    Take 1 tablet (81 mg total) by mouth daily.   ATORVASTATIN (LIPITOR) 80 MG TABLET    Take 1 tablet (80 mg total) by mouth daily at 6 PM.   METOPROLOL TARTRATE (LOPRESSOR) 25 MG TABLET    Take 1 tablet (25 mg total) by mouth 2 (two) times daily.   NITROGLYCERIN (NITROSTAT) 0.4 MG SL TABLET    Place 1 tablet (0.4 mg total) under the tongue every 5 (five) minutes as needed for chest pain.   OMEPRAZOLE (PRILOSEC) 20 MG CAPSULE    Take 20 mg by mouth as needed (for heartburn).    SERTRALINE (ZOLOFT) 100 MG TABLET    Take 1.5 tablets (150 mg total) by mouth daily.   TICAGRELOR (BRILINTA) 90 MG TABS TABLET    Take 1 tablet (90 mg total) by mouth 2 (two) times daily.    Review of Systems    Constitutional: Positive for fatigue. Negative for fever, chills, diaphoresis and appetite change.  Respiratory: Negative for chest tightness, shortness of breath and wheezing.   Cardiovascular: Negative for chest pain and palpitations.  Gastrointestinal: Negative for nausea, vomiting and abdominal pain.  Musculoskeletal: Positive for arthralgias (right shoulder) and neck pain. Negative for myalgias, back pain, joint swelling, gait problem, stiffness and neck stiffness.  Skin: Negative for itching.  Neurological: Negative for tingling and numbness.    Social History  Substance Use Topics  . Smoking status: Never Smoker   . Smokeless tobacco: Former Systems developer    Types: Chew  . Alcohol Use: No   Objective:   BP 120/80 mmHg  Pulse 70  Temp(Src) 98.1 F (36.7 C) (Oral)  Resp 16  Wt 248 lb (112.492 kg)  SpO2 96%  Physical Exam  General appearance: alert, well developed, well nourished, cooperative and in no distress Head: Normocephalic, without obvious abnormality, atraumatic MS: Mild tenderness along superior aspect scapula and shoulder. FROM of shoulder and neck with minimal pain, mostly with shoulder flexion. No swelling and no other gross deformities.      Assessment & Plan:     1. Right shoulder pain Unusual in that pain is primarily at  rest. Consider course of prednisone if Xrays are normal. Consider orthopedic referral.  - DG Shoulder Right; Future       Lelon Huh, MD  [facility]

## 2015-02-08 ENCOUNTER — Telehealth: Payer: Self-pay

## 2015-02-08 MED ORDER — PREDNISONE 10 MG PO TABS
ORAL_TABLET | ORAL | Status: AC
Start: 2015-02-08 — End: 2015-02-20

## 2015-02-08 NOTE — Telephone Encounter (Signed)
Patient advised as directed below. Patient verbalized understanding and agrees to start prednisone taper. Pharmacy- [facility]. Please clarify prednisone dosage.

## 2015-02-08 NOTE — Telephone Encounter (Signed)
-----   Message from Birdie Sons, MD sent at 02/08/2015  8:07 AM EST ----- Carolanne Grumbling of shoulder is normal. He likely has tendonitis of shoulder and should start 12 day prednisone taper. If not greatly improved over the weekend he should call back for referral to orthopedist.

## 2015-02-21 ENCOUNTER — Encounter: Payer: Self-pay | Admitting: Family Medicine

## 2015-02-21 ENCOUNTER — Ambulatory Visit (INDEPENDENT_AMBULATORY_CARE_PROVIDER_SITE_OTHER): Payer: BLUE CROSS/BLUE SHIELD | Admitting: Family Medicine

## 2015-02-21 VITALS — BP 118/80 | HR 76 | Temp 98.8°F | Resp 16 | Wt 248.2 lb

## 2015-02-21 DIAGNOSIS — H9202 Otalgia, left ear: Secondary | ICD-10-CM

## 2015-02-21 DIAGNOSIS — J0101 Acute recurrent maxillary sinusitis: Secondary | ICD-10-CM | POA: Diagnosis not present

## 2015-02-21 MED ORDER — FLUTICASONE PROPIONATE 50 MCG/ACT NA SUSP: 2.0000 | Freq: Every day | NASAL | Status: DC

## 2015-02-21 MED ORDER — AMOXICILLIN-POT CLAVULANATE 875-125 MG PO TABS: 1.0000 | ORAL_TABLET | Freq: Two times a day (BID) | ORAL | Status: DC

## 2015-02-21 NOTE — Progress Notes (Signed)
Patient ID: Eduardo Poole, male   DOB: 1970-02-26, 45 y.o.   MRN: [id number]   Patient: Eduardo Poole Male    DOB: [birth year]   45 y.o.   MRN: [id number] Visit Date: 02/21/2015  Today's Provider: Vernie Murders, PA   Chief Complaint  Patient presents with  . URI   Subjective:    URI  This is a new problem. The current episode started in the past 7 days. The problem has been gradually worsening. Associated symptoms include ear pain and a sore throat. Associated symptoms comments: Epistaxis. He has tried decongestant (sudafed) for the symptoms. The treatment provided no relief.   Patient Active Problem List   Diagnosis Date Noted  . Frequent PVCs 12/02/2014  . Abnormal stress test   . Cerebrovascular accident (CVA) ([place]) 10/20/2014  . Slurred speech 10/04/2014  . Temporary cerebral vascular dysfunction 10/04/2014  . History of TIA (transient ischemic attack) 08/04/2014  . Diastolic dysfunction [id number]  . Anxiety 08/02/2014  . Depression 08/02/2014  . Diplopia 08/02/2014  . Blood pressure elevated 08/02/2014  . Fatigue 08/02/2014  . Headache 08/02/2014  . H/O adenomatous polyp of colon 08/02/2014  . History of DVT (deep vein thrombosis) 08/02/2014  . Hypertension 08/02/2014  . Insomnia 08/02/2014  . Lymphadenopathy 08/02/2014  . Sleep disorder 08/02/2014  . Chest wall pain 08/02/2014  . Pleurisy 08/02/2014  . ST elevation myocardial infarction (STEMI) involving right coronary artery with complication ([place]) [id number]  . ACS (acute coronary syndrome) ([place]) 07/23/2014  . Combined fat and carbohydrate induced hyperlipemia 03/24/2014  . Breathlessness on exertion 03/24/2014  . History of knee surgery 07/18/2011  . Idiopathic localized osteoarthropathy 06/14/2011   Past Surgical History  Procedure Laterality Date  . Joint replacement Right 2014  . Cardiac catheterization N/A 07/23/2014    Procedure: Left Heart Cath;  Surgeon: Troy Sine, MD;  Location: [facility];  Service: Cardiovascular;  Laterality: N/A;  . Cardiac catheterization N/A 07/23/2014    Procedure: Coronary Balloon Angioplasty;  Surgeon: Troy Sine, MD;  Location: [facility];  Service: Cardiovascular;  Laterality: N/A;  . Arthroplasty Right 07/09/2010  . Replacement total knee Right 2011    [facility]  . Nasal sinus surgery      Deviated Septum  . Meniscus repair Right 11/03/1995    Meniscus Lateral Tear: repaired by [facility] Sabra Heck at [facility]  . Cardiac catheterization N/A 11/10/2014    Procedure: Left Heart Cath and Coronary Angiography;  Surgeon: Troy Sine, MD;  Location: [facility];  Service: Cardiovascular;  Laterality: N/A;   No Known Allergies    Previous Medications   ALPRAZOLAM (XANAX) 0.25 MG TABLET    Take 1 tablet (0.25 mg total) by mouth every 4 (four) hours as needed for anxiety.   ASPIRIN EC 81 MG EC TABLET    Take 1 tablet (81 mg total) by mouth daily.   ATORVASTATIN (LIPITOR) 80 MG TABLET    Take 1 tablet (80 mg total) by mouth daily at 6 PM.   METOPROLOL TARTRATE (LOPRESSOR) 25 MG TABLET    Take 1 tablet (25 mg total) by mouth 2 (two) times daily.   NITROGLYCERIN (NITROSTAT) 0.4 MG SL TABLET    Place 1 tablet (0.4 mg total) under the tongue every 5 (five) minutes as needed for chest pain.   OMEPRAZOLE (PRILOSEC) 20 MG CAPSULE    Take 20 mg by mouth as needed (for heartburn).  SERTRALINE (ZOLOFT) 100 MG TABLET    Take 1.5 tablets (150 mg total) by mouth daily.   TICAGRELOR (BRILINTA) 90 MG TABS TABLET    Take 1 tablet (90 mg total) by mouth 2 (two) times daily.    Review of Systems  Constitutional: Negative.   HENT: Positive for ear pain, nosebleeds and sore throat.   Eyes: Negative.   Respiratory: Negative.   Cardiovascular: Negative.   Gastrointestinal: Negative.   Endocrine: Negative.   Genitourinary: Negative.   Musculoskeletal: Negative.   Skin: Negative.   Allergic/Immunologic: Negative.     Neurological: Negative.   Hematological: Negative.   Psychiatric/Behavioral: Negative.     Social History  Substance Use Topics  . Smoking status: Never Smoker   . Smokeless tobacco: Former Systems developer    Types: Chew  . Alcohol Use: No   Objective:   BP 118/80 mmHg  Pulse 76  Temp(Src) 98.8 F (37.1 C) (Oral)  Resp 16  Wt 248 lb 3.2 oz (112.583 kg)  Physical Exam  Constitutional: He is oriented to person, place, and time. He appears well-developed and well-nourished. No distress.  HENT:  Head: Normocephalic and atraumatic.  Right Ear: Hearing and external ear normal.  Left Ear: Hearing and external ear normal.  Mouth/Throat: Oropharynx is clear and moist.  Tender left maxillary sinus with no transillumination.  Eyes: Conjunctivae, EOM and lids are normal. Right eye exhibits no discharge. Left eye exhibits no discharge. No scleral icterus.  Neck: Normal range of motion. Neck supple.  Cardiovascular: Normal rate and regular rhythm.   Pulmonary/Chest: Effort normal and breath sounds normal. No respiratory distress.  Abdominal: Soft. Bowel sounds are normal.  Musculoskeletal: Normal range of motion.  Lymphadenopathy:    He has no cervical adenopathy.  Neurological: He is alert and oriented to person, place, and time.  Skin: Skin is intact. No lesion and no rash noted.  Psychiatric: He has a normal mood and affect. His speech is normal and behavior is normal. Thought content normal.      Assessment & Plan:     1. Acute recurrent maxillary sinusitis Onset with left otalgia over the past 5 days. States he frequently with have recurrences and has had a sinus surgery in the past. Will treat with Flonase and Augmentin. May add Mucinex prn. May need to refer back to ENT (Dr. Tami Ribas did his last surgery) if no better. - amoxicillin-clavulanate (AUGMENTIN) 875-125 MG tablet; Take 1 tablet by mouth 2 (two) times daily.  Dispense: 20 tablet; Refill: 0  2. Otalgia, left No drainage from  the ear. Onset over the past 5 days. Will treat with Flonase and antibiotic. Recheck prn. - fluticasone (FLONASE) 50 MCG/ACT nasal spray; Place 2 sprays into both nostrils daily.  Dispense: 16 g; Refill: 6

## 2015-03-15 ENCOUNTER — Telehealth: Payer: Self-pay | Admitting: Family Medicine

## 2015-03-15 DIAGNOSIS — M542 Cervicalgia: Secondary | ICD-10-CM

## 2015-03-15 NOTE — Telephone Encounter (Signed)
Pt called saying he is still having neck pain and shoulder pain.  Was in a bout 45 days ago.  Steadily getting worse.  He said you talked about referring him to someone else.  Please advise [phone]  Thank sTeri

## 2015-03-16 NOTE — Telephone Encounter (Signed)
Please refer orthopedics for persistent neck pain

## 2015-03-17 NOTE — Telephone Encounter (Signed)
LMTCB

## 2015-03-27 ENCOUNTER — Other Ambulatory Visit: Payer: Self-pay | Admitting: Orthopedic Surgery

## 2015-03-27 DIAGNOSIS — M5412 Radiculopathy, cervical region: Secondary | ICD-10-CM

## 2015-04-12 ENCOUNTER — Ambulatory Visit
Admission: RE | Admit: 2015-04-12 | Discharge: 2015-04-12 | Disposition: A | Discharge status: Home / Self Care | Payer: BLUE CROSS/BLUE SHIELD | Source: Ambulatory Visit | Attending: Orthopedic Surgery | Admitting: Orthopedic Surgery

## 2015-04-12 DIAGNOSIS — M50322 Other cervical disc degeneration at C5-C6 level: Secondary | ICD-10-CM | POA: Insufficient documentation

## 2015-04-12 DIAGNOSIS — M50323 Other cervical disc degeneration at C6-C7 level: Secondary | ICD-10-CM | POA: Insufficient documentation

## 2015-04-12 DIAGNOSIS — M5412 Radiculopathy, cervical region: Secondary | ICD-10-CM

## 2015-04-12 DIAGNOSIS — M4722 Other spondylosis with radiculopathy, cervical region: Secondary | ICD-10-CM | POA: Insufficient documentation

## 2015-04-17 ENCOUNTER — Telehealth: Payer: Self-pay

## 2015-04-17 NOTE — Telephone Encounter (Signed)
Patient called saying that he has experienced chest pain w/shortness of breath since yesterday morning. He reports that the remainder of the day he felt really tired and slept the most of the day. Patient reports that he woke up this morning with the same symptoms, slightly worse. Patient reports that he has a pressure and tightness sensation in his chest. He has a headache, nausea, and lightheadedness. Patient reports that the pain does radiate to in between shoulder blades. He also reports that he has numbness and tingling in his arms, but reports that it is due to a pinched nerve that he has in his neck. Patient mentions that he has had a heart attack last year. Due to patient's PMH, recommended to patient that he should be evaluated in the ER. Advised patient to F/U with [place] after he has been seen. Patient verbalized understanding.

## 2015-05-19 ENCOUNTER — Other Ambulatory Visit: Payer: Self-pay | Admitting: Family Medicine

## 2015-05-29 DIAGNOSIS — M542 Cervicalgia: Secondary | ICD-10-CM | POA: Diagnosis not present

## 2015-05-29 DIAGNOSIS — M5412 Radiculopathy, cervical region: Secondary | ICD-10-CM | POA: Diagnosis not present

## 2015-05-31 DIAGNOSIS — M6281 Muscle weakness (generalized): Secondary | ICD-10-CM | POA: Diagnosis not present

## 2015-06-05 DIAGNOSIS — M5412 Radiculopathy, cervical region: Secondary | ICD-10-CM | POA: Diagnosis not present

## 2015-06-05 DIAGNOSIS — M542 Cervicalgia: Secondary | ICD-10-CM | POA: Diagnosis not present

## 2015-06-07 DIAGNOSIS — M5412 Radiculopathy, cervical region: Secondary | ICD-10-CM | POA: Diagnosis not present

## 2015-06-07 DIAGNOSIS — M542 Cervicalgia: Secondary | ICD-10-CM | POA: Diagnosis not present

## 2015-07-10 ENCOUNTER — Other Ambulatory Visit: Payer: Self-pay | Admitting: Family Medicine

## 2015-07-12 DIAGNOSIS — M542 Cervicalgia: Secondary | ICD-10-CM | POA: Diagnosis not present

## 2015-07-13 ENCOUNTER — Encounter: Payer: Self-pay | Admitting: Cardiovascular Disease

## 2015-07-13 ENCOUNTER — Ambulatory Visit (INDEPENDENT_AMBULATORY_CARE_PROVIDER_SITE_OTHER): Payer: BLUE CROSS/BLUE SHIELD | Admitting: Cardiovascular Disease

## 2015-07-13 VITALS — BP 138/96 | HR 70 | Ht 70.0 in | Wt 252.4 lb

## 2015-07-13 DIAGNOSIS — E785 Hyperlipidemia, unspecified: Secondary | ICD-10-CM

## 2015-07-13 DIAGNOSIS — I249 Acute ischemic heart disease, unspecified: Secondary | ICD-10-CM | POA: Diagnosis not present

## 2015-07-13 DIAGNOSIS — Z01818 Encounter for other preprocedural examination: Secondary | ICD-10-CM | POA: Diagnosis not present

## 2015-07-13 DIAGNOSIS — Z86718 Personal history of other venous thrombosis and embolism: Secondary | ICD-10-CM | POA: Diagnosis not present

## 2015-07-13 MED ORDER — CLOPIDOGREL BISULFATE 75 MG PO TABS: 75.0000 mg | ORAL_TABLET | Freq: Every day | ORAL | Status: DC

## 2015-07-13 NOTE — Patient Instructions (Signed)
Your physician has recommended you make the following change in your medication:   1.) start clopidogrel 75 mg  After surgery.  2.) STOP Brilinta.  Your physician recommends that you schedule a follow-up appointment in: 2 months.

## 2015-07-14 ENCOUNTER — Telehealth: Payer: Self-pay | Admitting: Cardiovascular Disease

## 2015-07-14 NOTE — Telephone Encounter (Signed)
Otila Kluver called in stating that she received the clearance form for the patient but did not receive the last ov note, labs , or EKG . She asked if this could be faxed to her office.   [phone])  Thanks

## 2015-07-14 NOTE — Telephone Encounter (Signed)
Recent office note is not complete. ECG has not been scanned into the system yet. No labs have been resulted. Will forward to wanda

## 2015-07-15 ENCOUNTER — Encounter: Payer: Self-pay | Admitting: Cardiovascular Disease

## 2015-07-15 DIAGNOSIS — Z01818 Encounter for other preprocedural examination: Secondary | ICD-10-CM | POA: Insufficient documentation

## 2015-07-15 DIAGNOSIS — E785 Hyperlipidemia, unspecified: Secondary | ICD-10-CM | POA: Insufficient documentation

## 2015-07-15 NOTE — Progress Notes (Signed)
Patient ID: Eduardo Poole, male   DOB: [birth year], 45 y.o.   MRN: [id number]     Primary MD: Dr. Lelon Huh Referring M.D.: Dr. Marcello Moores  PATIENT PROFILE: Eduardo Poole is a 45 y.o. male who is referred for preoperative clearance prior to undergoing three-level cervical disc surgery tenderly scheduled for 07/20/2015.   HPI:  Eduardo Poole suffered an acute coronary syndrome on June 2016.  At that time.  He initially presented Eduardo Poole where his ECG showed 2 mm of inferior ST segment elevation and a code STEMI was activated.  He also had transient TIA symptomatology.  Emergent catheterization revealed an ulcerated plaque in the distal RCA which had reperfused on anticoagulation therapy.  There was no residual high-grade stenosis.  PTCA alone was performed and the lesion was not stented.  His other coronary arteries were normal.  Left ventriculography showed normal LV function and a normal appearing aortic anatomy.  He was continued on Angiomax and started on aspirin and Brilinta.    An echo Doppler study showed an EF of 60-65% without wall motion abnormalities.  There was grade 1 diastolic dysfunction. Subsequently, he had noted some occasional PVCs but these have stabilized.  He developed some recurrent chest pain symptomatology in September 2016 which led to a nuclear stress test was wasn't interpreted as intermediate risk and suggesting possible scar/ischemia inferolaterally.  Repeat cardiac catheterization was done in 11/10/2014 and this revealed normal coronary arteries without evidence for restenosis in the distal RCA at the site of the prior ulcerated plaque.    Over the past year, he has continued to be stable.  He denies chest pain, shortness of breath, or palpitations.  He works as an Dispensing optician.  He has developed 3.  Herniated disc in his neck and is in need for surgery.  This is tentatively scheduled for 07/20/2015.  He presents for evaluation.  He has  remote history of suffering a DVT approximately 9 years ago and transiently was treated with Coumadin.   Past Medical History  Diagnosis Date  . DVT of lower extremity (deep venous thrombosis) ([facility])     7 years ago, per pt was on lovenox/coumadin  . MI (myocardial infarction) ([place])   . TIA (transient ischemic attack) 07/23/2014    Past Surgical History  Procedure Laterality Date  . Joint replacement    . Cardiac catheterization N/A 07/23/2014    Procedure: Left Heart Cath;  Surgeon: Troy Sine, MD;  Location: [facility];  Service: Cardiovascular;  Laterality: N/A;  . Cardiac catheterization N/A 07/23/2014    Procedure: Coronary Balloon Angioplasty;  Surgeon: Troy Sine, MD;  Location: [facility];  Service: Cardiovascular;  Laterality: N/A;  . Arthroplasty Right 07/09/2010  . [facility]  . Nasal sinus surgery      Deviated Septum  . Meniscus repair Right 11/03/1995    Meniscus Lateral Tear: repaired by Dr. Sabra Heck at [facility]  . Cardiac catheterization N/A 11/10/2014    Procedure: Left Heart Cath and Coronary Angiography;  Surgeon: Troy Sine, MD;  Location: [facility];  Service: Cardiovascular;  Laterality: N/A;    No Known Allergies  Current Outpatient Prescriptions  Medication Sig Dispense Refill  . ALPRAZolam (XANAX) 0.25 MG tablet Take 1 tablet (0.25 mg total) by mouth every 4 (four) hours as needed for anxiety. 30 tablet 1  . aspirin EC 81 MG EC  tablet Take 1 tablet (81 mg total) by mouth daily.    Marland Kitchen atorvastatin (LIPITOR) 80 MG tablet Take 1 tablet (80 mg total) by mouth daily at 6 PM. 30 tablet 11  . fluticasone (FLONASE) 50 MCG/ACT nasal spray Place 2 sprays into both nostrils daily. 16 g 6  . metoprolol tartrate (LOPRESSOR) 25 MG tablet Take 25 mg by mouth daily.    . nitroGLYCERIN (NITROSTAT) 0.4 MG SL tablet Place 1 tablet (0.4 mg total) under the tongue every 5 (five) minutes  as needed for chest pain. 25 tablet 12  . omeprazole (PRILOSEC) 20 MG capsule Take 20 mg by mouth as needed (for heartburn).     . sertraline (ZOLOFT) 100 MG tablet TAKE ONE AND ONE-HALF TABLETS DAILY 45 tablet 12  . clopidogrel (PLAVIX) 75 MG tablet Take 1 tablet (75 mg total) by mouth daily. 30 tablet 11   No current facility-administered medications for this visit.    Social History   Social History  . Marital Status: Married    Spouse Name: N/A  . Number of Children: 2  . Years of Education: 12   Occupational History  . Employed     Works at [employer]  . Smoking status: Never Smoker   . Smokeless tobacco: Former Systems developer    Types: Chew  . Alcohol Use: No  . Drug Use: No  . Sexual Activity: Not on file   Other Topics Concern  . Not on file   Social History Narrative    Family History  Problem Relation Age of Onset  . Hypertension Mother   . Arthritis Mother   . Diverticulitis Mother   . Gout Father   . Diabetes Paternal Grandfather     ROS General: Negative; No fevers, chills, or night sweats HEENT: Negative; No changes in vision or hearing, sinus congestion, difficulty swallowing Pulmonary: Negative; No cough, wheezing, shortness of breath, hemoptysis Cardiovascular:  See HPI;  GI: Negative; No nausea, vomiting, diarrhea, or abdominal pain GU: Negative; No dysuria, hematuria, or difficulty voiding Musculoskeletal: Positive for cervical disc disease  Hematologic/Oncologic: Negative; no easy bruising, bleeding Endocrine: Negative; no heat/cold intolerance; no diabetes Neuro: Negative; no changes in balance, headaches Skin: Negative; No rashes or skin lesions Psychiatric: Negative; No behavioral problems, depression Sleep: Negative; No daytime sleepiness, hypersomnolence, bruxism, restless legs, hypnogagnic hallucinations Other comprehensive 14 point system review is negative   Physical Exam BP 138/96 mmHg  Pulse 70  Ht 5'  10" (1.778 m)  Wt 252 lb 6.4 oz (114.488 kg)  BMI 36.22 kg/m2  Wt Readings from Last 3 Encounters:  07/13/15 252 lb 6.4 oz (114.488 kg)  02/21/15 248 lb 3.2 oz (112.583 kg)  02/07/15 248 lb (112.492 kg)   General: Alert, oriented, no distress.  Skin: normal turgor, no rashes, warm and dry HEENT: Normocephalic, atraumatic. Pupils equal round and reactive to light; sclera anicteric; extraocular muscles intact; Fundi without hemorrhages or exudates; disc flat Nose without nasal septal hypertrophy Mouth/Parynx benign; Mallinpatti scale 3 Neck: No JVD, no carotid bruits; normal carotid upstroke Lungs: clear to ausculatation and percussion; no wheezing or rales Chest wall: without tenderness to palpitation Heart: PMI not displaced, RRR, s1 s2 normal, 1/6 systolic murmur, no diastolic murmur, no rubs, gallops, thrills, or heaves Abdomen: soft, nontender; no hepatosplenomehaly, BS+; abdominal aorta nontender and not dilated by palpation. Back: no CVA tenderness Pulses 2+ Musculoskeletal: full range of motion, normal strength, no joint deformities Extremities: no clubbing cyanosis  or edema, Homan's sign negative  Neurologic: grossly nonfocal; Cranial nerves grossly wnl Psychologic: Normal mood and affect   ECG (independently read by me): ECG from 07/12/2015.  As part of his preoperative screening showed normal sinus rhythm at 71 bpm.  There was incomplete right bundle branch block.  Left axis deviation.  LABS:  BMP Latest Ref Rng 11/09/2014 10/31/2014 10/18/2014  Glucose 65 - 99 mg/dL 105(H) 124(H) 94  BUN 6 - 24 mg/dL 16 20 18   Creatinine 0.76 - 1.27 mg/dL 1.06 0.92 1.18  BUN/Creat Ratio 9 - 20 15 - 15  Sodium 134 - 144 mmol/L 141 138 141  Potassium 3.5 - 5.2 mmol/L 4.7 4.2 4.6  Chloride 97 - 108 mmol/L 101 106 100  CO2 18 - 29 mmol/L 25 25 24   Calcium 8.7 - 10.2 mg/dL 9.6 9.3 9.8     Hepatic Function Latest Ref Rng 10/18/2014 09/06/2014 04/26/2013  Total Protein 6.0 - 8.5 g/dL 7.3 6.9  8.1  Albumin 3.5 - 5.5 g/dL 4.5 4.5 4.3  AST 0 - 40 IU/L 26 31 48(H)  ALT 0 - 44 IU/L 50(H) 55(H) 66  Alk Phosphatase 39 - 117 IU/L 77 73 66  Total Bilirubin 0.0 - 1.2 mg/dL 0.5 0.6 0.5    CBC Latest Ref Rng 11/09/2014 10/31/2014 10/18/2014  WBC 3.4 - 10.8 x10E3/uL 7.9 8.2 9.1  Hemoglobin 13.0 - 18.0 g/dL - 14.4 -  Hematocrit 37.5 - 51.0 % 41.8 42.4 45.0  Platelets 150 - 379 x10E3/uL 290 294 323   Lab Results  Component Value Date   MCV 90 11/09/2014   MCV 89.7 10/31/2014   MCV 91 10/18/2014   Lab Results  Component Value Date   TSH 2.900 10/18/2014   Lab Results  Component Value Date   HGBA1C 5.8* 07/24/2014     BNP No results found for: BNP  ProBNP No results found for: PROBNP   Lipid Panel     Component Value Date/Time   CHOL 97* 09/06/2014 0721   CHOL 180 07/24/2014 0417   TRIG 133 09/06/2014 0721   HDL 25* 09/06/2014 0721   HDL 25* 07/24/2014 0417   CHOLHDL 3.9 09/06/2014 0721   CHOLHDL 7.2 07/24/2014 0417   VLDL 34 07/24/2014 0417   LDLCALC 45 09/06/2014 0721   LDLCALC 121* 07/24/2014 0417    RADIOLOGY: No results found.   ASSESSMENT AND PLAN: Eduardo Poole is a 45 year old gentleman who suffered a remote DVT approximately 9 years ago and was on anticoagulation therapy short-term.  He developed an acute coronary syndrome on 07/23/2014 and was found to have an ulcerated plaque but without significant residual stenosis following initial anticoagulation therapy.  This was successfully treated with PTCA, but was not stented.  Repeat catheterization for somewhat atypical chest pain after nuclear study suggested possible inferolateral ischemia revealed entirely normal coronary arteries.  He has been maintained on aspirin and Brilinta.  He has been without recurrent anginal symptomatology.  His blood pressure today is stable on metoprolol, tartrate 25 mg twice a day.  He also is on omeprazole 20 mg as needed for heartburn.  He has been maintained on aspirin  and Brilinta.  I am given him clearance to undergo planned cervical disc surgery.  He will be holding his Brilinta for 5-6 days prior to surgery.  Since after surgery it will be a year following his acute artery syndrome, when he is stable.  neurosurgically, rather than reinstituting Brilinta I have suggested that generic Plavix be initiated.  There is no urgency for immediate initiation since he does not have a stent in place.  With his thrombotic potential he would benefit from continuation of dual antiplatelet therapy long-term.  Once Plavix is started, a PGY 12 test will be obtained in 1-2 weeks to assess Plavix responsiveness.  He continues to be on high potency atorvastatin 80 mg.  Subsequent, LDL after initiation was excellent at 45.  I will see him in 2 months for cardiology reevaluation following his neck surgery or sooner if problems arise.  Time spent: 25 minutes  Troy Sine, MD, [facility] 07/15/2015 12:44 PM

## 2015-07-18 NOTE — Telephone Encounter (Signed)
Office note faxed to number provided. EKG not scanned into the system yet. No labs were ordered.

## 2015-07-20 DIAGNOSIS — Z6835 Body mass index (BMI) 35.0-35.9, adult: Secondary | ICD-10-CM | POA: Diagnosis not present

## 2015-07-20 DIAGNOSIS — E785 Hyperlipidemia, unspecified: Secondary | ICD-10-CM | POA: Diagnosis not present

## 2015-07-20 DIAGNOSIS — M50322 Other cervical disc degeneration at C5-C6 level: Secondary | ICD-10-CM | POA: Diagnosis not present

## 2015-07-20 DIAGNOSIS — M5030 Other cervical disc degeneration, unspecified cervical region: Secondary | ICD-10-CM | POA: Diagnosis not present

## 2015-07-20 DIAGNOSIS — F329 Major depressive disorder, single episode, unspecified: Secondary | ICD-10-CM | POA: Diagnosis not present

## 2015-07-20 DIAGNOSIS — M542 Cervicalgia: Secondary | ICD-10-CM | POA: Diagnosis not present

## 2015-07-20 DIAGNOSIS — I252 Old myocardial infarction: Secondary | ICD-10-CM | POA: Diagnosis not present

## 2015-07-20 DIAGNOSIS — I10 Essential (primary) hypertension: Secondary | ICD-10-CM | POA: Diagnosis not present

## 2015-07-20 DIAGNOSIS — K449 Diaphragmatic hernia without obstruction or gangrene: Secondary | ICD-10-CM | POA: Diagnosis not present

## 2015-07-20 DIAGNOSIS — I2510 Atherosclerotic heart disease of native coronary artery without angina pectoris: Secondary | ICD-10-CM | POA: Diagnosis not present

## 2015-07-20 DIAGNOSIS — M5012 Mid-cervical disc disorder, unspecified level: Secondary | ICD-10-CM | POA: Diagnosis not present

## 2015-07-20 DIAGNOSIS — Z87891 Personal history of nicotine dependence: Secondary | ICD-10-CM | POA: Diagnosis not present

## 2015-07-20 DIAGNOSIS — Z86718 Personal history of other venous thrombosis and embolism: Secondary | ICD-10-CM | POA: Diagnosis not present

## 2015-07-20 DIAGNOSIS — I4891 Unspecified atrial fibrillation: Secondary | ICD-10-CM | POA: Diagnosis not present

## 2015-07-20 DIAGNOSIS — Z7901 Long term (current) use of anticoagulants: Secondary | ICD-10-CM | POA: Diagnosis not present

## 2015-07-20 DIAGNOSIS — F419 Anxiety disorder, unspecified: Secondary | ICD-10-CM | POA: Diagnosis not present

## 2015-07-20 DIAGNOSIS — Z8673 Personal history of transient ischemic attack (TIA), and cerebral infarction without residual deficits: Secondary | ICD-10-CM | POA: Diagnosis not present

## 2015-07-20 DIAGNOSIS — E669 Obesity, unspecified: Secondary | ICD-10-CM | POA: Diagnosis not present

## 2015-07-20 DIAGNOSIS — Z7982 Long term (current) use of aspirin: Secondary | ICD-10-CM | POA: Diagnosis not present

## 2015-07-20 DIAGNOSIS — M50321 Other cervical disc degeneration at C4-C5 level: Secondary | ICD-10-CM | POA: Diagnosis not present

## 2015-07-21 ENCOUNTER — Other Ambulatory Visit: Payer: Self-pay | Admitting: Physician Assistant

## 2015-07-21 DIAGNOSIS — E785 Hyperlipidemia, unspecified: Secondary | ICD-10-CM | POA: Diagnosis not present

## 2015-07-21 DIAGNOSIS — M5012 Mid-cervical disc disorder, unspecified level: Secondary | ICD-10-CM | POA: Diagnosis not present

## 2015-07-21 DIAGNOSIS — I10 Essential (primary) hypertension: Secondary | ICD-10-CM | POA: Diagnosis not present

## 2015-07-21 DIAGNOSIS — I4891 Unspecified atrial fibrillation: Secondary | ICD-10-CM | POA: Diagnosis not present

## 2015-07-21 DIAGNOSIS — Z8673 Personal history of transient ischemic attack (TIA), and cerebral infarction without residual deficits: Secondary | ICD-10-CM | POA: Diagnosis not present

## 2015-07-21 DIAGNOSIS — E669 Obesity, unspecified: Secondary | ICD-10-CM | POA: Diagnosis not present

## 2015-07-21 DIAGNOSIS — F419 Anxiety disorder, unspecified: Secondary | ICD-10-CM | POA: Diagnosis not present

## 2015-07-21 DIAGNOSIS — F329 Major depressive disorder, single episode, unspecified: Secondary | ICD-10-CM | POA: Diagnosis not present

## 2015-07-21 DIAGNOSIS — Z7901 Long term (current) use of anticoagulants: Secondary | ICD-10-CM | POA: Diagnosis not present

## 2015-07-21 DIAGNOSIS — Z6835 Body mass index (BMI) 35.0-35.9, adult: Secondary | ICD-10-CM | POA: Diagnosis not present

## 2015-07-21 DIAGNOSIS — I252 Old myocardial infarction: Secondary | ICD-10-CM | POA: Diagnosis not present

## 2015-07-21 DIAGNOSIS — Z86718 Personal history of other venous thrombosis and embolism: Secondary | ICD-10-CM | POA: Diagnosis not present

## 2015-07-21 DIAGNOSIS — K449 Diaphragmatic hernia without obstruction or gangrene: Secondary | ICD-10-CM | POA: Diagnosis not present

## 2015-07-21 DIAGNOSIS — Z87891 Personal history of nicotine dependence: Secondary | ICD-10-CM | POA: Diagnosis not present

## 2015-07-21 DIAGNOSIS — Z7982 Long term (current) use of aspirin: Secondary | ICD-10-CM | POA: Diagnosis not present

## 2015-07-21 DIAGNOSIS — I2510 Atherosclerotic heart disease of native coronary artery without angina pectoris: Secondary | ICD-10-CM | POA: Diagnosis not present

## 2015-07-24 ENCOUNTER — Encounter: Payer: Self-pay | Admitting: Cardiovascular Disease

## 2015-08-02 DIAGNOSIS — M542 Cervicalgia: Secondary | ICD-10-CM | POA: Diagnosis not present

## 2015-08-24 ENCOUNTER — Telehealth: Payer: Self-pay | Admitting: Family Medicine

## 2015-08-24 MED ORDER — SERTRALINE HCL 100 MG PO TABS: 200.0000 mg | ORAL_TABLET | Freq: Every day | ORAL | Status: DC

## 2015-08-24 NOTE — Telephone Encounter (Signed)
Pt contacted office for refill request on the following medications:  sertraline (ZOLOFT) 100 MG tablet.  Pt is requesting this changed to 200mg .  [facility].  CB#[id number]/MW  Pt states 200mg  is working better for him/MW

## 2015-08-24 NOTE — Telephone Encounter (Signed)
Please advise 

## 2015-08-25 DIAGNOSIS — M542 Cervicalgia: Secondary | ICD-10-CM | POA: Diagnosis not present

## 2015-09-19 ENCOUNTER — Encounter: Payer: Self-pay | Admitting: Physician Assistant

## 2015-09-19 ENCOUNTER — Ambulatory Visit (INDEPENDENT_AMBULATORY_CARE_PROVIDER_SITE_OTHER): Payer: BLUE CROSS/BLUE SHIELD | Admitting: Physician Assistant

## 2015-09-19 VITALS — BP 128/88 | HR 56 | Temp 98.5°F | Resp 16 | Ht 70.0 in | Wt 254.0 lb

## 2015-09-19 DIAGNOSIS — M5136 Other intervertebral disc degeneration, lumbar region: Secondary | ICD-10-CM | POA: Insufficient documentation

## 2015-09-19 DIAGNOSIS — R0789 Other chest pain: Secondary | ICD-10-CM

## 2015-09-19 DIAGNOSIS — E782 Mixed hyperlipidemia: Secondary | ICD-10-CM

## 2015-09-19 DIAGNOSIS — M5030 Other cervical disc degeneration, unspecified cervical region: Secondary | ICD-10-CM | POA: Insufficient documentation

## 2015-09-19 DIAGNOSIS — I10 Essential (primary) hypertension: Secondary | ICD-10-CM

## 2015-09-19 DIAGNOSIS — G479 Sleep disorder, unspecified: Secondary | ICD-10-CM

## 2015-09-19 NOTE — Progress Notes (Signed)
Patient: Eduardo Poole Male    DOB: [birth year]   45 y.o.   MRN: [id number] Visit Date: 09/19/2015  Today's Provider: Mar Daring, PA-C   Chief Complaint  Patient presents with  . Hyperlipidemia  . Hypertension  . Back Pain   Subjective:    Back Pain  This is a new problem. The current episode started more than 1 month ago. The problem has been waxing and waning since onset. The pain is present in the lumbar spine (Mostly on the right side. ). The pain does not radiate. The pain is the same all the time. Exacerbated by: Pt is unable to figure out of aggravates his back pain.  Associated symptoms include chest pain, headaches and numbness (Hands go numb). Pertinent negatives include no abdominal pain, fever or weakness. He has tried home exercises for the symptoms. The treatment provided no relief.  Patient recently had disc replacement of C3-C4 and C4-C5 on July 23, 2015 by Dr. Sharyne Richters and follows up with them on Friday 09/22/15.   Hypertension, follow-up:  BP Readings from Last 3 Encounters:  09/19/15 128/88  07/13/15 (!) 138/96  02/21/15 118/80    He was last seen for hypertension greater than 1 years ago. He had a MI in June 2016 with angioplasty to R coronary and has been followed by cardiology. Management since that visit includes Pt reports his Cardiologist (Dr. Shelva Majestic) increased his Metoprolol to twice a day.He reports excellent compliance with treatment. He is having side effects. Pt is feeling fatigue, headache, lightheaded, his ears are ringing, blurry vision. Tinnitus has been present for a long time and he has been seen by ENT for this. He is exercising. Active at work as well. He is adherent to low salt diet.   Outside blood pressures are:  Pt reports not checking his blood pressure outside the office. He is experiencing chest pain, fatigue and palpitations. (Started July 8th; he did call the cardiologist and has an appointment August 29th). These  have only been occurring at rest. Does have SOB, no increased DOE. He has been active and has not had any symptoms/chest pain with activities.  Patient denies near-syncope and syncope.   Cardiovascular risk factors include hypertension, male gender and obesity (BMI >= 30 kg/m2).   ------------------------------------------------------------------------   Lipid/Cholesterol, Follow-up:   Last seen for this greater than one years ago.  Management since that visit includes None.  Last Lipid Panel:    Component Value Date/Time   CHOL 97 (L) 09/06/2014 0721   TRIG 133 09/06/2014 0721   HDL 25 (L) 09/06/2014 0721   CHOLHDL 3.9 09/06/2014 0721   CHOLHDL 7.2 07/24/2014 0417   VLDL 34 07/24/2014 0417   LDLCALC 45 09/06/2014 0721    He reports excellent compliance with treatment. He is having side effects.   Wt Readings from Last 3 Encounters:  09/19/15 254 lb (115.2 kg)  07/13/15 252 lb 6.4 oz (114.5 kg)  02/21/15 248 lb 3.2 oz (112.6 kg)   He is also needing labs done for work. ------------------------------------------------------------------------     No Known Allergies Current Meds  Medication Sig  . ALPRAZolam (XANAX) 0.25 MG tablet Take 1 tablet (0.25 mg total) by mouth every 4 (four) hours as needed for anxiety.  Marland Kitchen aspirin EC 81 MG EC tablet Take 1 tablet (81 mg total) by mouth daily.  Marland Kitchen atorvastatin (LIPITOR) 80 MG tablet TAKE 1 TABLET BY MOUTH DAILY  . clopidogrel (PLAVIX)  75 MG tablet Take 1 tablet (75 mg total) by mouth daily.  . metoprolol tartrate (LOPRESSOR) 25 MG tablet Take 25 mg by mouth daily.  . nitroGLYCERIN (NITROSTAT) 0.4 MG SL tablet Place 1 tablet (0.4 mg total) under the tongue every 5 (five) minutes as needed for chest pain.  Marland Kitchen omeprazole (PRILOSEC) 20 MG capsule Take 20 mg by mouth as needed (for heartburn).   . sertraline (ZOLOFT) 100 MG tablet Take 2 tablets (200 mg total) by mouth daily.    Review of Systems  Constitutional: Positive for fatigue.  Negative for activity change, appetite change, chills, diaphoresis, fever and unexpected weight change.  Eyes: Negative for visual disturbance.  Respiratory: Positive for apnea (Pt reports snorning at night.  He says he needs a sleep study.) and shortness of breath. Negative for cough, choking, chest tightness, wheezing and stridor.   Cardiovascular: Positive for chest pain, palpitations and leg swelling.  Gastrointestinal: Positive for nausea. Negative for abdominal distention, abdominal pain, anal bleeding, blood in stool, constipation, diarrhea, rectal pain and vomiting.  Genitourinary: Negative.   Musculoskeletal: Positive for back pain (lower right sided back pain.  Started about a month ago. ) and neck pain (Recent neck surgery.  Back of his neck hurts he is not sure if it is related to the surgery or blood pressure. ). Negative for arthralgias, gait problem, joint swelling, myalgias and neck stiffness.  Neurological: Positive for dizziness, tremors, light-headedness, numbness (Hands go numb) and headaches. Negative for syncope and weakness.  Psychiatric/Behavioral: Positive for dysphoric mood (increased stress at work). Negative for sleep disturbance. The patient is not nervous/anxious.     Social History  Substance Use Topics  . Smoking status: Never Smoker  . Smokeless tobacco: Former Systems developer    Types: Chew  . Alcohol use No   Objective:   BP 128/88 (BP Location: Left Arm, Patient Position: Sitting, Cuff Size: Large)   Pulse (!) 56   Temp 98.5 F (36.9 C) (Oral)   Resp 16   Ht 5\' 10"  (1.778 m)   Wt 254 lb (115.2 kg)   BMI 36.45 kg/m   Physical Exam  Constitutional: He appears well-developed and well-nourished. No distress.  HENT:  Head: Normocephalic and atraumatic.  Neck: Normal range of motion. Neck supple. No tracheal deviation present. No thyromegaly present.  Cardiovascular: Normal rate, regular rhythm and normal heart sounds.  Exam reveals no gallop and no friction rub.    No murmur heard. Pulmonary/Chest: Effort normal and breath sounds normal. No respiratory distress. He has no wheezes. He has no rales.  Musculoskeletal: He exhibits no edema.  Lymphadenopathy:    He has no cervical adenopathy.  Skin: He is not diaphoretic.  Psychiatric: He has a normal mood and affect. His behavior is normal. Judgment and thought content normal.  Vitals reviewed.     Assessment & Plan:     1. Essential hypertension Stable readings in office today. Will check labs as below and f/u pending results. Continue current medical treatment plan with metoprolol 25mg  BID as directed by Dr. Claiborne Billings. - CBC with Differential - Comprehensive Metabolic Panel (CMET)  2. Combined fat and carbohydrate induced hyperlipemia Stable. Continue atorvastatin 80mg  daily per Dr. Claiborne Billings. Will check labs as below and f/u pending results. - Lipid Profile  3. Atypical chest pain EKG was normal today in the office. Told him to call Dr. Claiborne Billings to inform him of the new chest pain as he may require another stress test. Chest pain has been occurring  only at rest. States he walks up 3 flights of stairs at work without pain or increased DOE. Question underlying anxiety component but with atypical cardiac history he needs further evaluation first. Continue zoloft and cardiac meds as prescribed.  - EKG 12-Lead  4. Sleep disorder He voices concerns of apnea and snoring voiced by his wife. Discussed sleep study but patient does not wish to have this ordered at this time until after cardiac workup.  5. DDD (degenerative disc disease), cervical Recent surgery by Dr. Sharyne Richters. Follows up on Friday 09/22/15.  6. DDD (degenerative disc disease), lumbar See above.       Mar Daring, PA-C  [facility]

## 2015-09-19 NOTE — Patient Instructions (Signed)
Angina Pectoris Angina pectoris, often called angina, is extreme discomfort in the chest, neck, or arm. This is caused by a lack of blood in the middle and thickest layer of the heart wall (myocardium). There are four types of angina:  Stable angina. Stable angina usually occurs in episodes of predictable frequency and duration. It is usually brought on by physical activity, stress, or excitement. Stable angina usually lasts a few minutes and can often be relieved by a medicine that you place under your tongue. This medicine is called sublingual nitroglycerin.  Unstable angina. Unstable angina can occur even when you are doing little or no physical activity. It can even occur while you are sleeping or when you are at rest. It can suddenly increase in severity or frequency. It may not be relieved by sublingual nitroglycerin, and it can last up to 30 minutes.  Microvascular angina. This type of angina is caused by a disorder of tiny blood vessels called arterioles. Microvascular angina is more common in women. The pain may be more severe and last longer than other types of angina pectoris.  Prinzmetal or variant angina. This type of angina pectoris is rare and usually occurs when you are doing little or no physical activity. It especially occurs in the early morning hours. CAUSES Atherosclerosis is the cause of angina. This is the buildup of fat and cholesterol (plaque) on the inside of the arteries. Over time, the plaque may narrow or block the artery, and this will lessen blood flow to the heart. Plaque can also become weak and break off within a coronary artery to form a clot and cause a sudden blockage. RISK FACTORS Risk factors common to both men and women include:  High cholesterol levels.  High blood pressure (hypertension).  Tobacco use.  Diabetes.  Family history of angina.  Obesity.  Lack of exercise.  A diet high in saturated fats. Women are at greater risk for angina if they  are:  Over age 9.  Postmenopausal. SYMPTOMS Many people do not experience any symptoms during the early stages of angina. As the condition progresses, symptoms common to both men and women may include:  Chest pain.  The pain can be described as a crushing or squeezing in the chest, or a tightness, pressure, fullness, or heaviness in the chest.  The pain can last more than a few minutes, or it can stop and recur.  Pain in the arms, neck, jaw, or back.  Unexplained heartburn or indigestion.  Shortness of breath.  Nausea.  Sudden cold sweats.  Sudden light-headedness. Many women have chest discomfort and some of the other symptoms. However, women often have different (atypical) symptoms, such as:   Fatigue.  Unexplained feelings of nervousness or anxiety.  Unexplained weakness.  Dizziness or fainting. Sometimes, women may have angina without any symptoms. DIAGNOSIS  Tests to diagnose angina may include:  ECG (electrocardiogram).  Exercise stress test. This looks for signs of blockage when the heart is being exercised.  Pharmacologic stress test. This test looks for signs of blockage when the heart is being stressed with a medicine.  Blood tests.  Coronary angiogram. This is a procedure to look at the coronary arteries to see if there is any blockage. TREATMENT  The treatment of angina may include the following:  Healthy behavioral changes to reduce or control risk factors.  Medicine.  Coronary stenting.A stent helps to keep an artery open.  Coronary angioplasty. This procedure widens a narrowed or blocked artery.  Coronary arterybypass  TREATMENT   The treatment of angina may include the following:  · Healthy behavioral changes to reduce or control risk factors.  · Medicine.  · Coronary stenting. A stent helps to keep an artery open.  · Coronary angioplasty. This procedure widens a narrowed or blocked artery.  · Coronary artery bypass surgery. This will allow your blood to pass the blockage (bypass) to reach your heart.  HOME CARE INSTRUCTIONS   · Take medicines only as directed by your health care provider.  · Do not take the following medicines unless your health care provider approves:    Nonsteroidal anti-inflammatory drugs (NSAIDs), such as ibuprofen,  naproxen, or celecoxib.    Vitamin supplements that contain vitamin A, vitamin E, or both.    Hormone replacement therapy that contains estrogen with or without progestin.  · Manage other health conditions such as hypertension and diabetes as directed by your health care provider.  · Follow a heart-healthy diet. A dietitian can help to educate you about healthy food options and changes.  · Use healthy cooking methods such as roasting, grilling, broiling, baking, poaching, steaming, or stir-frying. Talk to a dietitian to learn more about healthy cooking methods.  · Follow an exercise program approved by your health care provider.  · Maintain a healthy weight. Lose weight as approved by your health care provider.  · Plan rest periods when fatigued.  · Learn to manage stress.  · Do not use any tobacco products, including cigarettes, chewing tobacco, or electronic cigarettes. If you need help quitting, ask your health care provider.  · If you drink alcohol, and your health care provider approves, limit your alcohol intake to no more than 1 drink per day. One drink equals 12 ounces of beer, 5 ounces of wine, or 1½ ounces of hard liquor.  · Stop illegal drug use.  · Keep all follow-up visits as directed by your health care provider. This is important.  SEEK IMMEDIATE MEDICAL CARE IF:   · You have pain in your chest, neck, arm, jaw, stomach, or back that lasts more than a few minutes, is recurring, or is unrelieved by taking sublingual nitroglycerin.  · You have profuse sweating without cause.  · You have unexplained:    Heartburn or indigestion.    Shortness of breath or difficulty breathing.    Nausea or vomiting.    Fatigue.    Feelings of nervousness or anxiety.    Weakness.    Diarrhea.  · You have sudden light-headedness or dizziness.  · You faint.  These symptoms may represent a serious problem that is an emergency. Do not wait to see if the symptoms will go away. Get medical help right away. Call your local  emergency services (911 in the [place] Do not drive yourself to the hospital.     This information is not intended to replace advice given to you by your health care provider. Make sure you discuss any questions you have with your health care provider.     Document Released: 02/04/2005 Document Revised: 02/25/2014 Document Reviewed: 06/08/2013  Elsevier Interactive Patient Education ©2016 [employer]

## 2015-09-20 ENCOUNTER — Telehealth: Payer: Self-pay

## 2015-09-20 DIAGNOSIS — R0681 Apnea, not elsewhere classified: Secondary | ICD-10-CM

## 2015-09-20 DIAGNOSIS — R0683 Snoring: Secondary | ICD-10-CM

## 2015-09-20 DIAGNOSIS — R400 Somnolence: Secondary | ICD-10-CM

## 2015-09-20 DIAGNOSIS — E669 Obesity, unspecified: Secondary | ICD-10-CM

## 2015-09-20 DIAGNOSIS — Z6836 Body mass index (BMI) 36.0-36.9, adult: Secondary | ICD-10-CM

## 2015-09-20 DIAGNOSIS — I10 Essential (primary) hypertension: Secondary | ICD-10-CM

## 2015-09-20 LAB — CBC WITH DIFFERENTIAL/PLATELET
BASOS ABS: 0 10*3/uL (ref 0.0–0.2)
BASOS: 1 %
EOS (ABSOLUTE): 0.1 10*3/uL (ref 0.0–0.4)
Eos: 2 %
HEMOGLOBIN: 14.1 g/dL (ref 12.6–17.7)
Hematocrit: 41.8 % (ref 37.5–51.0)
Immature Grans (Abs): 0 10*3/uL (ref 0.0–0.1)
Immature Granulocytes: 0 %
LYMPHS ABS: 2.1 10*3/uL (ref 0.7–3.1)
Lymphs: 31 %
MCH: 29.9 pg (ref 26.6–33.0)
MCHC: 33.7 g/dL (ref 31.5–35.7)
MCV: 89 fL (ref 79–97)
MONOCYTES: 7 %
Monocytes Absolute: 0.5 10*3/uL (ref 0.1–0.9)
NEUTROS ABS: 4 10*3/uL (ref 1.4–7.0)
Neutrophils: 59 %
Platelets: 300 10*3/uL (ref 150–379)
RBC: 4.71 x10E6/uL (ref 4.14–5.80)
RDW: 13.7 % (ref 12.3–15.4)
WBC: 6.7 10*3/uL (ref 3.4–10.8)

## 2015-09-20 LAB — COMPREHENSIVE METABOLIC PANEL
ALK PHOS: 73 IU/L (ref 39–117)
ALT: 35 IU/L (ref 0–44)
AST: 29 IU/L (ref 0–40)
Albumin/Globulin Ratio: 1.5 (ref 1.2–2.2)
Albumin: 4.2 g/dL (ref 3.5–5.5)
BUN/Creatinine Ratio: 19 (ref 9–20)
BUN: 19 mg/dL (ref 6–24)
Bilirubin Total: 0.4 mg/dL (ref 0.0–1.2)
CALCIUM: 9.4 mg/dL (ref 8.7–10.2)
CO2: 23 mmol/L (ref 18–29)
CREATININE: 1.01 mg/dL (ref 0.76–1.27)
Chloride: 101 mmol/L (ref 96–106)
GFR calc Af Amer: 103 mL/min/{1.73_m2} (ref >59)
GFR, EST NON AFRICAN AMERICAN: 89 mL/min/{1.73_m2} (ref >59)
GLOBULIN, TOTAL: 2.8 g/dL (ref 1.5–4.5)
GLUCOSE: 100 mg/dL — AB (ref 65–99)
Potassium: 4.8 mmol/L (ref 3.5–5.2)
Sodium: 140 mmol/L (ref 134–144)
Total Protein: 7 g/dL (ref 6.0–8.5)

## 2015-09-20 LAB — LIPID PANEL
CHOL/HDL RATIO: 4.3 ratio (ref 0.0–5.0)
CHOLESTEROL TOTAL: 120 mg/dL (ref 100–199)
HDL: 28 mg/dL — ABNORMAL LOW (ref >39)
LDL CALC: 62 mg/dL (ref 0–99)
Triglycerides: 152 mg/dL — ABNORMAL HIGH (ref 0–149)
VLDL Cholesterol Cal: 30 mg/dL (ref 5–40)

## 2015-09-20 NOTE — Telephone Encounter (Signed)
Order placed for sleep study.

## 2015-09-20 NOTE — Telephone Encounter (Signed)
Spoke with patient on the phone who spoke with his wife about getting study done, he states that he has not had cardiac work up yet but wants to proceed with study because he believes he has sleep apnea.

## 2015-09-20 NOTE — Telephone Encounter (Signed)
-----   Message from Mar Daring, PA-C sent at 09/20/2015  9:00 AM EDT ----- All labs are within normal limits and stable. Cholesterol and blood sugar have improved from previous labs.  Thanks! -JB

## 2015-09-20 NOTE — Telephone Encounter (Signed)
Patient has been advised of lab report, he states that he would like to get an update in regards to his sleep study and insurance coverage. KW

## 2015-09-20 NOTE — Telephone Encounter (Signed)
Called and advised patient that the Health Screening Result Form is ready for pick up and that was also faxed to [phone]  Thanks,  -Wade Sigala R.

## 2015-09-20 NOTE — Telephone Encounter (Signed)
He had told me at the visit he did not want me to order this yet as he wanted to get through this cardiac work-up. Is he wanting now? If so I will order.

## 2015-09-22 DIAGNOSIS — M5030 Other cervical disc degeneration, unspecified cervical region: Secondary | ICD-10-CM | POA: Diagnosis not present

## 2015-09-22 DIAGNOSIS — M542 Cervicalgia: Secondary | ICD-10-CM | POA: Diagnosis not present

## 2015-09-26 NOTE — Telephone Encounter (Signed)
error 

## 2015-09-28 DIAGNOSIS — M5030 Other cervical disc degeneration, unspecified cervical region: Secondary | ICD-10-CM | POA: Diagnosis not present

## 2015-10-03 DIAGNOSIS — M5030 Other cervical disc degeneration, unspecified cervical region: Secondary | ICD-10-CM | POA: Diagnosis not present

## 2015-10-13 DIAGNOSIS — F419 Anxiety disorder, unspecified: Secondary | ICD-10-CM | POA: Diagnosis not present

## 2015-10-13 DIAGNOSIS — F331 Major depressive disorder, recurrent, moderate: Secondary | ICD-10-CM | POA: Diagnosis not present

## 2015-10-17 ENCOUNTER — Encounter: Payer: Self-pay | Admitting: Nurse Practitioner

## 2015-10-17 ENCOUNTER — Ambulatory Visit (INDEPENDENT_AMBULATORY_CARE_PROVIDER_SITE_OTHER): Payer: BLUE CROSS/BLUE SHIELD | Admitting: Nurse Practitioner

## 2015-10-17 ENCOUNTER — Other Ambulatory Visit (HOSPITAL_COMMUNITY)
Admission: RE | Admit: 2015-10-17 | Discharge: 2015-10-17 | Disposition: A | Discharge status: Home / Self Care | Payer: BLUE CROSS/BLUE SHIELD | Source: Ambulatory Visit | Attending: Nurse Practitioner | Admitting: Nurse Practitioner

## 2015-10-17 VITALS — BP 151/105 | HR 73 | Ht 70.0 in | Wt 258.0 lb

## 2015-10-17 DIAGNOSIS — I2510 Atherosclerotic heart disease of native coronary artery without angina pectoris: Secondary | ICD-10-CM

## 2015-10-17 DIAGNOSIS — E785 Hyperlipidemia, unspecified: Secondary | ICD-10-CM | POA: Diagnosis not present

## 2015-10-17 DIAGNOSIS — K219 Gastro-esophageal reflux disease without esophagitis: Secondary | ICD-10-CM

## 2015-10-17 DIAGNOSIS — I119 Hypertensive heart disease without heart failure: Secondary | ICD-10-CM

## 2015-10-17 DIAGNOSIS — I25119 Atherosclerotic heart disease of native coronary artery with unspecified angina pectoris: Secondary | ICD-10-CM | POA: Insufficient documentation

## 2015-10-17 LAB — PLATELET INHIBITION P2Y12: Platelet Function  P2Y12: 10 [PRU] — ABNORMAL LOW (ref 194–418)

## 2015-10-17 MED ORDER — PANTOPRAZOLE SODIUM 40 MG PO TBEC: 40.0000 mg | DELAYED_RELEASE_TABLET | Freq: Every day | ORAL | 6 refills | Status: DC

## 2015-10-17 MED ORDER — BISOPROLOL FUMARATE 5 MG PO TABS
5.0000 mg | ORAL_TABLET | Freq: Every day | ORAL | 6 refills | Status: DC
Start: 2015-10-17 — End: 2016-01-18

## 2015-10-17 NOTE — Progress Notes (Signed)
Office Visit    Patient Name: Eduardo Poole Date of Encounter: 10/17/2015  Primary Care Provider:  Lelon Huh, MD Primary Cardiologist:  Corky Downs, MD   Chief Complaint    45 year old male with a prior history of CAD status post inferior MI with PTCA of the right coronary artery in June 2016, who presents for follow-up.   Past Medical History    Past Medical History:  Diagnosis Date  . CAD (coronary artery disease)    a. 07/2014 Inf STEMI/PTCA:  LM nl, LAD nl, LCX nl, RCA ulcerated plaque (PTCA only);  b. 10/2014 MV: intermediate risk, ? inflat isch/scar;  c. 10/2014 Cath: Nl cors w/o RCA restenosis.  . Diastolic dysfunction    a. 07/2014 Echo: EF 60-65%, Gr1 DD.  . DVT of lower extremity (deep venous thrombosis) ([place])    a. ~ 2009, per pt was on lovenox/coumadin  . Hyperlipidemia   . Hypertensive heart disease   . TIA (transient ischemic attack) 07/23/2014   Past Surgical History:  Procedure Laterality Date  . ARTHROPLASTY Right 07/09/2010  . CARDIAC CATHETERIZATION N/A 07/23/2014   Procedure: Left Heart Cath;  Surgeon: Troy Sine, MD;  Location: [place] [facility];  Service: Cardiovascular;  Laterality: N/A;  . CARDIAC CATHETERIZATION N/A 07/23/2014   Procedure: Coronary Balloon Angioplasty;  Surgeon: Troy Sine, MD;  Location: [place] [facility];  Service: Cardiovascular;  Laterality: N/A;  . CARDIAC CATHETERIZATION N/A 11/10/2014   Procedure: Left Heart Cath and Coronary Angiography;  Surgeon: Troy Sine, MD;  Location: [place];  Service: Cardiovascular;  Laterality: N/A;  . JOINT REPLACEMENT    . MENISCUS REPAIR Right 11/03/1995   Meniscus Lateral Tear: repaired by Dr. Sabra Heck at [facility]  . NASAL SINUS SURGERY     Deviated Septum  . REPLACEMENT TOTAL KNEE Right 2011   [facility]    Allergies  No Known Allergies  History of Present Illness    45 year old male with history of complex past medical history including  inferior STEMI/ACS in June 2016 with catheterization revealing an ulcerated plaque in the right coronary artery and otherwise normal coronary arteries. The RCA was treated with balloon angioplasty. He subsequently had an intermediate stress test in the setting of recurrent chest pain in September 2016 with subsequent catheterization revealing stable anatomy. He has since been medically managed. He last saw Dr. Claiborne Billings in May of this year at which time, he was cleared for neck surgery related to a herniated disc. It was recommended that he could come off of brilinta prior to surgery and as it would have been a year since his event, he should be placed on Plavix post operatively. A prescription was provided.  He had his neck surgery in June and says that since then, his neck pain is actually a little bit worse. He also has neuropathic pain down his arms and into his fingers as well as intermittent paresthesias. He has also continued to have intermittent sharp left-sided chest pain that has occurred on a regular basis ever since his initial event in June 2016. This typically occurs with stress or emotional upset. He has not noticed it during periods of exertion. He has also had this discomfort while drinking a caffeinated beverage and sometimes it awakens him at night. Of note, he does have a history of gastritis and had previously been on Prilosec but ran out and hasn't been taking a PPI.  His blood pressure is  elevated today and he says that he has not been taking metoprolol as prescribed. He is supposed to be taking 25 mg twice a day but instead only takes 25 mg daily at bedtime. He says that when he takes the daytime dose, he feels very fatigued and "like I have the flu." He would be interested in trying an alternate beta blocker. He denies palpitations, PND, orthopnea, dizziness, syncope, edema, or early satiety.  Home Medications    Prior to Admission medications   Medication Sig Start Date End Date Taking?  Authorizing Provider  ALPRAZolam (XANAX) 0.25 MG tablet Take 1 tablet (0.25 mg total) by mouth every 4 (four) hours as needed for anxiety. 11/02/14   Birdie Sons, MD  aspirin EC 81 MG EC tablet Take 1 tablet (81 mg total) by mouth daily. 07/26/14   Brett Canales, PA-C  atorvastatin (LIPITOR) 80 MG tablet TAKE 1 TABLET BY MOUTH DAILY 07/24/15   Troy Sine, MD  clopidogrel (PLAVIX) 75 MG tablet Take 1 tablet (75 mg total) by mouth daily. 07/13/15   Troy Sine, MD  fluticasone (FLONASE) 50 MCG/ACT nasal spray Place 2 sprays into both nostrils daily. Patient not taking: Reported on 09/19/2015 02/21/15   Vickki Muff Chrismon, PA  metoprolol tartrate (LOPRESSOR) 25 MG tablet Take 25 mg by mouth 2 (two) times daily.    Historical Provider, MD  nitroGLYCERIN (NITROSTAT) 0.4 MG SL tablet Place 1 tablet (0.4 mg total) under the tongue every 5 (five) minutes as needed for chest pain. 07/26/14   Brett Canales, PA-C  omeprazole (PRILOSEC) 20 MG capsule Take 20 mg by mouth as needed (for heartburn).  06/01/14   Historical Provider, MD  sertraline (ZOLOFT) 100 MG tablet Take 2 tablets (200 mg total) by mouth daily. 08/24/15   Birdie Sons, MD    Review of Systems    Fatigue and intermittent sharp chest pain as outlined above.  He continues to have neck pain along with neuropathic pain and paresthesias affecting his bilat upper and lower ext.  All other systems reviewed and are otherwise negative except as noted above.  Physical Exam    VS:  BP (!) 151/105   Pulse 73   Ht 5\' 10"  (1.778 m)   Wt 258 lb (117 kg)   BMI 37.02 kg/m  , BMI Body mass index is 37.02 kg/m. GEN: Well nourished, well developed, in no acute distress.  HEENT: normal.  Neck: Supple, no JVD, carotid bruits, or masses. Cardiac: RRR, no murmurs, rubs, or gallops. No clubbing, cyanosis, edema.  Radials/DP/PT 2+ and equal bilaterally.  Respiratory:  Respirations regular and unlabored, clear to auscultation bilaterally. GI: Soft, nontender,  nondistended, BS + x 4. MS: no deformity or atrophy. Skin: warm and dry, no rash. Neuro:  Strength and sensation are intact. Psych: Normal affect.  Accessory Clinical Findings    P2Y12 pending  Assessment & Plan    1.  Coronary artery disease: Status post prior inferior MI with ulcerated plaque of the right coronary artery requiring PTCA. That was in June 2016. He underwent follow-up catheterization following an abnormal stress test in September 2016, revealing patency of the RCA. He has completed 1 year of brilinta therapy and is now on Plavix. As previously planned, I will check a P2 Y 12 today to determine responsiveness to Plavix. If he is a nonresponder, I would plan to switch to brilinta 60 mg twice a day based on results from the PEGASUS trial. As noted  above, he has had fatigue with his daytime dose of metoprolol, and so he has not been taking it. He is willing to try an alternate beta blocker, and we will prescribe bisoprolol 5 mg daily. If he does not tolerate this, we may need to discontinue beta blocker therapy altogether and prescribed an alternate antihypertensive. He remains on statin therapy and is tolerating this LDL on August 1 was 62.  2. Hypertensive heart disease: Blood pressure is elevated today. As noted above, he has not been taking his beta blocker as prescribed, taking only the evening dose because the daytime dose makes him feel fatigued and wiped out. He is willing to switch from twice a day metoprolol to once daily bisoprolol to see if this makes a difference for him. He will continue to follow his blood pressure at home. If he remains hypertensive, I would have a low threshold to add an ACE inhibitor.  3. Hyperlipidemia: He remains on high potency statin therapy with an LDL of 62 on August 1. LFTs were normal at that time.  4. GERD/gastritis/noncardiac chest pain: Patient has a year plus history of intermittent sharp shooting pain that can be worsened by drinking  caffeinated beverages and also by lying down. He has had prior cardiac workup in September 2016 which revealed stable anatomy. He has previously been prescribed Prilosec but is out of this. Prilosec did help the symptoms in the past. As he is now on Plavix, Prilosec is not a good option, and therefore I will prescribe Protonix 40 mg daily.  5. Morbid obesity: Patient's activity is limited by bilateral knee pain as well as neck pain and peripheral neuropathic pain. He would likely benefit from outpatient nutritional counseling.  6. Disposition: Follow-up P2 Y 12 today. He will follow his blood pressure at home and notify us if it remains elevated and or if he cannot tolerate bisoprolol. Otherwise follow-up in the office in 3 months.   Murray Hodgkins, NP 10/17/2015, 8:40 AM

## 2015-10-17 NOTE — Patient Instructions (Signed)
Medication Instructions:  DISCONTINUE Omeprazole DISCONTINUE Metoprolol STARTBisoprolol 5mg ; Take 1 tab by mouth daily  Labwork: Your physician recommends that you return for lab work in: [place]  Testing/Procedures: None Ordered  Follow-Up: Your physician recommends that you schedule a follow-up appointment in: [place].   Any Other Special Instructions Will Be Listed Below (If Applicable).     If you need a refill on your cardiac medications before your next appointment, please call your pharmacy.

## 2015-10-31 DIAGNOSIS — F331 Major depressive disorder, recurrent, moderate: Secondary | ICD-10-CM | POA: Diagnosis not present

## 2015-10-31 DIAGNOSIS — F419 Anxiety disorder, unspecified: Secondary | ICD-10-CM | POA: Diagnosis not present

## 2015-11-01 DIAGNOSIS — M5412 Radiculopathy, cervical region: Secondary | ICD-10-CM | POA: Diagnosis not present

## 2015-11-02 ENCOUNTER — Ambulatory Visit: Payer: BLUE CROSS/BLUE SHIELD | Attending: Otolaryngology

## 2015-11-02 DIAGNOSIS — R001 Bradycardia, unspecified: Secondary | ICD-10-CM | POA: Diagnosis not present

## 2015-11-02 DIAGNOSIS — G4733 Obstructive sleep apnea (adult) (pediatric): Secondary | ICD-10-CM | POA: Diagnosis not present

## 2015-11-02 DIAGNOSIS — R0683 Snoring: Secondary | ICD-10-CM | POA: Diagnosis not present

## 2015-11-07 ENCOUNTER — Encounter: Payer: Self-pay | Admitting: Emergency Medicine

## 2015-11-07 ENCOUNTER — Emergency Department: Payer: BLUE CROSS/BLUE SHIELD

## 2015-11-07 ENCOUNTER — Emergency Department
Admission: EM | Admit: 2015-11-07 | Discharge: 2015-11-07 | Disposition: A | Discharge status: Home / Self Care | Payer: BLUE CROSS/BLUE SHIELD | Attending: Emergency Medicine | Admitting: Emergency Medicine

## 2015-11-07 ENCOUNTER — Telehealth: Payer: Self-pay | Admitting: Cardiovascular Disease

## 2015-11-07 ENCOUNTER — Other Ambulatory Visit: Payer: Self-pay

## 2015-11-07 DIAGNOSIS — Z79899 Other long term (current) drug therapy: Secondary | ICD-10-CM | POA: Diagnosis not present

## 2015-11-07 DIAGNOSIS — R079 Chest pain, unspecified: Secondary | ICD-10-CM

## 2015-11-07 DIAGNOSIS — Z7982 Long term (current) use of aspirin: Secondary | ICD-10-CM | POA: Diagnosis not present

## 2015-11-07 DIAGNOSIS — I10 Essential (primary) hypertension: Secondary | ICD-10-CM | POA: Insufficient documentation

## 2015-11-07 DIAGNOSIS — Z87891 Personal history of nicotine dependence: Secondary | ICD-10-CM | POA: Insufficient documentation

## 2015-11-07 DIAGNOSIS — R1013 Epigastric pain: Secondary | ICD-10-CM | POA: Diagnosis not present

## 2015-11-07 DIAGNOSIS — R0602 Shortness of breath: Secondary | ICD-10-CM | POA: Diagnosis not present

## 2015-11-07 DIAGNOSIS — R109 Unspecified abdominal pain: Secondary | ICD-10-CM

## 2015-11-07 DIAGNOSIS — R0789 Other chest pain: Secondary | ICD-10-CM | POA: Diagnosis not present

## 2015-11-07 DIAGNOSIS — I2510 Atherosclerotic heart disease of native coronary artery without angina pectoris: Secondary | ICD-10-CM | POA: Insufficient documentation

## 2015-11-07 DIAGNOSIS — R1011 Right upper quadrant pain: Secondary | ICD-10-CM | POA: Insufficient documentation

## 2015-11-07 HISTORY — DX: Essential (primary) hypertension: I10

## 2015-11-07 LAB — HEPATIC FUNCTION PANEL
ALBUMIN: 4.1 g/dL (ref 3.5–5.0)
ALK PHOS: 52 U/L (ref 38–126)
ALT: 35 U/L (ref 17–63)
AST: 24 U/L (ref 15–41)
Bilirubin, Direct: <0.1 mg/dL — ABNORMAL LOW (ref 0.1–0.5)
TOTAL PROTEIN: 7.2 g/dL (ref 6.5–8.1)
Total Bilirubin: 0.6 mg/dL (ref 0.3–1.2)

## 2015-11-07 LAB — CBC
HCT: 43.5 % (ref 40.0–52.0)
Hemoglobin: 14.9 g/dL (ref 13.0–18.0)
MCH: 30.2 pg (ref 26.0–34.0)
MCHC: 34.3 g/dL (ref 32.0–36.0)
MCV: 88.2 fL (ref 80.0–100.0)
PLATELETS: 305 10*3/uL (ref 150–440)
RBC: 4.93 MIL/uL (ref 4.40–5.90)
RDW: 13.6 % (ref 11.5–14.5)
WBC: 11.8 10*3/uL — AB (ref 3.8–10.6)

## 2015-11-07 LAB — TROPONIN I: Troponin I: <0.03 ng/mL (ref <0.03)

## 2015-11-07 LAB — BASIC METABOLIC PANEL
Anion gap: 9 (ref 5–15)
BUN: 22 mg/dL — ABNORMAL HIGH (ref 6–20)
CALCIUM: 9 mg/dL (ref 8.9–10.3)
CO2: 22 mmol/L (ref 22–32)
CREATININE: 0.96 mg/dL (ref 0.61–1.24)
Chloride: 106 mmol/L (ref 101–111)
Glucose, Bld: 111 mg/dL — ABNORMAL HIGH (ref 65–99)
Potassium: 4 mmol/L (ref 3.5–5.1)
SODIUM: 137 mmol/L (ref 135–145)

## 2015-11-07 LAB — LIPASE, BLOOD: Lipase: 20 U/L (ref 11–51)

## 2015-11-07 MED ORDER — IOPAMIDOL (ISOVUE-370) INJECTION 76%
75.0000 mL | Freq: Once | INTRAVENOUS | Status: AC | PRN
  Administered 2015-11-07: 75 mL via INTRAVENOUS

## 2015-11-07 NOTE — ED Provider Notes (Addendum)
[facility] Emergency Department Provider Note  ____________________________________________   I have reviewed the triage vital signs and the nursing notes.   HISTORY  Chief Complaint Chest Pain    HPI Eduardo Poole is a 45 y.o. male who presents today complaining of left-sided nonradiating chest discomfort which is substernal and been there without interruption since Sunday. He states he feels slightly nauseated. He does not for short of breath or respiratory. Denies any fever or chills. Not worse with food. Nothing makes it better nothing makes his significant worse. Did have some decreased discomfort with ambulation earlier but is not reliably exacerbated by exertion and it began at rest. Did not radiate to the back. Gradual in onset. Patient states that she had no numbness or weakness. His last heart catheterization was within 2 years and was completely normal with no occlusions. Prior to that, he did have a RCA plaque that was apparently transitory and did not require stenting. Patient has a history of a DVT many years ago. He states that he has had no pleuritic chest pain and again this is not associated with any significant dyspnea. Patient does state he has occasional right upper quadrant abdominal discomfort as well. He states he is under increased stress at work but denies any SI or HI. He has had no cough. He denies ever having pain exactly like this before. It is a sharp pain.  Patient also states that he has history of hiatal hernia and has had abdominal pain from that that was not dissimilar to this. She does take his Protonix as prescribed.   Past Medical History:  Diagnosis Date  . CAD (coronary artery disease)    a. 07/2014 Inf STEMI/PTCA:  LM nl, LAD nl, LCX nl, RCA ulcerated plaque (PTCA only);  b. 10/2014 MV: intermediate risk, ? inflat isch/scar;  c. 10/2014 Cath: Nl cors w/o RCA restenosis.  . Diastolic dysfunction    a. 07/2014 Echo: EF 60-65%,  Gr1 DD.  . DVT of lower extremity (deep venous thrombosis) ([place])    a. ~ 2009, per pt was on lovenox/coumadin  . Hyperlipidemia   . Hypertensive heart disease   . TIA (transient ischemic attack) 07/23/2014    Patient Active Problem List   Diagnosis Date Noted  . Hyperlipidemia   . CAD (coronary artery disease)   . Hypertensive heart disease   . DDD (degenerative disc disease), cervical 09/19/2015  . DDD (degenerative disc disease), lumbar 09/19/2015  . Hyperlipidemia LDL goal <70 07/15/2015  . Frequent PVCs 12/02/2014  . Abnormal stress test   . Cerebrovascular accident (CVA) ([place]) 10/20/2014  . Temporary cerebral vascular dysfunction 10/04/2014  . History of TIA (transient ischemic attack) 08/04/2014  . Diastolic dysfunction [id number]  . Anxiety 08/02/2014  . Depression 08/02/2014  . Diplopia 08/02/2014  . Fatigue 08/02/2014  . Headache 08/02/2014  . H/O adenomatous polyp of colon 08/02/2014  . History of DVT (deep vein thrombosis) 08/02/2014  . Hypertension 08/02/2014  . Insomnia 08/02/2014  . Lymphadenopathy 08/02/2014  . Sleep disorder 08/02/2014  . Chest wall pain 08/02/2014  . Pleurisy 08/02/2014  . ST elevation myocardial infarction (STEMI) involving right coronary artery with complication ([place]) [id number]  . ACS (acute coronary syndrome) ([place]) 07/23/2014  . Combined fat and carbohydrate induced hyperlipemia 03/24/2014  . Breathlessness on exertion 03/24/2014  . History of knee surgery 07/18/2011  . Idiopathic localized osteoarthropathy 06/14/2011    Past Surgical History:  Procedure Laterality Date  . ARTHROPLASTY Right 07/09/2010  .  CARDIAC CATHETERIZATION N/A 07/23/2014   Procedure: Left Heart Cath;  Surgeon: Troy Sine, MD;  Location: [place] [facility];  Service: Cardiovascular;  Laterality: N/A;  . CARDIAC CATHETERIZATION N/A 07/23/2014   Procedure: Coronary Balloon Angioplasty;  Surgeon: Troy Sine, MD;  Location: [place] [facility];  Service:  Cardiovascular;  Laterality: N/A;  . CARDIAC CATHETERIZATION N/A 11/10/2014   Procedure: Left Heart Cath and Coronary Angiography;  Surgeon: Troy Sine, MD;  Location: [place] [facility];  Service: Cardiovascular;  Laterality: N/A;  . JOINT REPLACEMENT    . MENISCUS REPAIR Right 11/03/1995   Meniscus Lateral Tear: repaired by Dr. Sabra Heck at [facility]  . NASAL SINUS SURGERY     Deviated Septum  . REPLACEMENT TOTAL KNEE Right 2011   [facility]    Prior to Admission medications   Medication Sig Start Date End Date Taking? Authorizing Provider  ALPRAZolam (XANAX) 0.25 MG tablet Take 1 tablet (0.25 mg total) by mouth every 4 (four) hours as needed for anxiety. 11/02/14   Birdie Sons, MD  aspirin EC 81 MG EC tablet Take 1 tablet (81 mg total) by mouth daily. 07/26/14   Brett Canales, PA-C  atorvastatin (LIPITOR) 80 MG tablet TAKE 1 TABLET BY MOUTH DAILY 07/24/15   Troy Sine, MD  bisoprolol (ZEBETA) 5 MG tablet Take 1 tablet (5 mg total) by mouth daily. 10/17/15   Rogelia Mire, NP  clopidogrel (PLAVIX) 75 MG tablet Take 1 tablet (75 mg total) by mouth daily. 07/13/15   Troy Sine, MD  fluticasone (FLONASE) 50 MCG/ACT nasal spray Place 2 sprays into both nostrils daily. 02/21/15   Vickki Muff Chrismon, PA  nitroGLYCERIN (NITROSTAT) 0.4 MG SL tablet Place 1 tablet (0.4 mg total) under the tongue every 5 (five) minutes as needed for chest pain. 07/26/14   Brett Canales, PA-C  pantoprazole (PROTONIX) 40 MG tablet Take 1 tablet (40 mg total) by mouth daily. 10/17/15   Rogelia Mire, NP  sertraline (ZOLOFT) 100 MG tablet Take 2 tablets (200 mg total) by mouth daily. 08/24/15   Birdie Sons, MD    Allergies Review of patient's allergies indicates no known allergies.  Family History  Problem Relation Age of Onset  . Hypertension Mother   . Arthritis Mother   . Diverticulitis Mother   . Gout Father   . Diabetes Paternal Grandfather     Social  History Social History  Substance Use Topics  . Smoking status: Never Smoker  . Smokeless tobacco: Former Systems developer    Types: Chew  . Alcohol use No    Review of Systems Constitutional: No fever/chills Eyes: No visual changes. ENT: No sore throat. No stiff neck no neck pain Cardiovascular: Positive chest pain. Respiratory: See history of present illness regarding shortness of breath. Gastrointestinal:   no vomiting.  No diarrhea.  No constipation. Genitourinary: Negative for dysuria. Musculoskeletal: Negative lower extremity swelling Skin: Negative for rash. Neurological: Negative for severe headaches, focal weakness or numbness. 10-point ROS otherwise negative.  ____________________________________________   PHYSICAL EXAM:  VITAL SIGNS: ED Triage Vitals  Enc Vitals Group     BP 11/07/15 0935 123/77     Pulse Rate 11/07/15 0935 (!) 58     Resp 11/07/15 0935 20     Temp 11/07/15 0935 98.8 F (37.1 C)     Temp Source 11/07/15 0935 Oral     SpO2 11/07/15 0935 97 %  Weight 11/07/15 0929 258 lb (117 kg)     Height 11/07/15 0929 5\' 10"  (1.778 m)     Head Circumference --      Peak Flow --      Pain Score 11/07/15 0929 6     Pain Loc --      Pain Edu? --      Excl. in [place]? --     Constitutional: Alert and oriented. Well appearing and in no acute distress. Eyes: Conjunctivae are normal. PERRL. EOMI. Head: Atraumatic. Nose: No congestion/rhinnorhea. Mouth/Throat: Mucous membranes are moist.  Oropharynx non-erythematous. Neck: No stridor.   Nontender with no meningismus Cardiovascular: Normal rate, regular rhythm. Grossly normal heart sounds.  Good peripheral circulation. Respiratory: Normal respiratory effort.  No retractions. Lungs CTAB. Abdominal: Soft and positive tenderness palpation of the right upper quadrant. No distention. No guarding no rebound Back:  There is no focal tenderness or step off.  there is no midline tenderness there are no lesions noted. there is no  CVA tenderness Musculoskeletal: No lower extremity tenderness, no upper extremity tenderness. No joint effusions, no DVT signs strong distal pulses no edema Neurologic:  Normal speech and language. No gross focal neurologic deficits are appreciated.  Skin:  Skin is warm, dry and intact. No rash noted. Psychiatric: Mood and affect are normal. Speech and behavior are normal.  ____________________________________________   LABS (all labs ordered are listed, but only abnormal results are displayed)  Labs Reviewed  BASIC METABOLIC PANEL - Abnormal; Notable for the following:       Result Value   Glucose, Bld 111 (*)    BUN 22 (*)    All other components within normal limits  CBC - Abnormal; Notable for the following:    WBC 11.8 (*)    All other components within normal limits  TROPONIN I  HEPATIC FUNCTION PANEL  LIPASE, BLOOD   ____________________________________________  EKG  I personally interpreted any EKGs ordered by me or triage EKG shows a sinus bradycardia with no acute ST elevation or depression, borderline LAD no acute changes ____________________________________________  RADIOLOGY  I reviewed any imaging ordered by me or triage that were performed during my shift and, if possible, patient and/or family made aware of any abnormal findings. ____________________________________________   PROCEDURES  Procedure(s) performed: None  Procedures  Critical Care performed: None  ____________________________________________   INITIAL IMPRESSION / ASSESSMENT AND PLAN / ED COURSE  Pertinent labs & imaging results that were available during my care of the patient were reviewed by me and considered in my medical decision making (see chart for details).  Patient here with chest pain. We have seen for chest pain in the past with negative workups. He has had a very recent negative heart catheter with 0 lesions in his coronaries. He has been uninterrupted chest pain now since  Sunday with negative troponin and a reassuring EKG I do not think therefore this represents ACS Or dissection and serial cardiac enzymes. On exam, the patient does have some reproducible right upper quadrant discomfort lasted only could be the cause of his pain. We will obtain an ultrasound of his gallbladder and check liver function tests and lipase. If that is negative, it is also noted the patient has a history of DVT. I have very low suspicion for PE given the constant nature of the nonpleuritic chest pain that he is having is certainly is not without the realm of possibility that he is having a blood clot we'll consider whether we  need to scan him after working him up for an think is more likely given his reproducible right upper quadrant pain in the context of nausea and discomfort for 2 days uninterrupted.  ----------------------------------------- 1:27 PM on 11/07/2015 ----------------------------------------- Expulsive workup in the emergency department reassuring. Serial troponins are negative, CT chest shows no evidence of dissection or PE, serial cardiac enzymes are negative EKG reassuring review of prior records is done patient is pain-free after he got here. He does have a history of hiatal hernia and stomach erosions and this is most likely the cause of his pain given that we have to the extent possible rule out everything else. He declines further workup or admission at this time stating he would like to go home. He remains pain-free. Extensive return precautions given and understood.At this time, there does not appear to be clinical evidence to support the diagnosis of pulmonary embolus, dissection, myocarditis, endocarditis, pericarditis, pericardial tamponade, acute coronary syndrome, pneumothorax, pneumonia, or any other acute intrathoracic pathology that will require admission or acute intervention. Nor is there evidence of any significant intra-abdominal pathology causing this  discomfort.   Clinical Course   ____________________________________________   FINAL CLINICAL IMPRESSION(S) / ED DIAGNOSES  Final diagnoses:  Abdominal pain      This chart was dictated using voice recognition software.  Despite best efforts to proofread,  errors can occur which can change meaning.      Schuyler Amor, MD 11/07/15 [place], MD 11/07/15 O[place], MD 11/07/15 1330

## 2015-11-07 NOTE — Telephone Encounter (Signed)
Received call from patient-pt reports episodes of CP since Sunday afternoon. Initially thought it was muscle but the pain has continued.    Describes pain as tightness/pressure radiating to his back.  Reports pain increases with activity, reports going up flight of stairs and pain became intense.  Yesterday AM patient reports episode with nausea, took 2 NTG and pain was relieved.  Had another episode last PM as well.    This AM went up stairs again and pain came back-took 1 NTG 5-6 minutes ago and pain was relieved.  Reports SOB and dizziness with pain.  Reports BP being elevated as well-yesterday 154/98, no reading for today.  Pt reports overall not feeling well, fatigued even though he is sleeping all night.    Advised to proceed to ER for evaluation at this time, advised to have someone drive him.  Reports he will be going to [facility] for evaluation.  Pt verbalized understanding.

## 2015-11-07 NOTE — Telephone Encounter (Signed)
New message    Pt C/O of Chest Pain: STAT if CP now or developed within 24 hours  1. Are you having CP right now? Yes  Pain level 1-10 : 6   2. Are you experiencing any other symptoms (ex. SOB, nausea, vomiting, sweating)? Hurting in middle of back   3. How long have you been experiencing CP? Since Sunday afternoon - no emergency room visit   4. Is your CP continuous or coming and going? coming  & going   5. Have you taken Nitroglycerin? @ 5 min ago  ?

## 2015-11-07 NOTE — ED Triage Notes (Signed)
Pt to ed with c/o chest pain that started yesterday.  Pt states she took 2 ntg with relief.  Was told to come to ed today by cardiologist.  Pt reports chest pain today 6/10 constant and associated with sob.

## 2015-11-07 NOTE — ED Notes (Signed)
Patient transported to CT 

## 2015-11-13 ENCOUNTER — Telehealth: Payer: Self-pay | Admitting: Family Medicine

## 2015-11-13 NOTE — Telephone Encounter (Signed)
Patient advised as directed below. Voiced understanding.  Thanks,  -Derrich Gaby 

## 2015-11-13 NOTE — Telephone Encounter (Signed)
Please Review.  Thanks,  -Joseline 

## 2015-11-13 NOTE — Telephone Encounter (Signed)
Results showed he does have sleep apnea. They are requesting to do a titration study with the CPAP machine. I have signed the order for this and they will contact him to schedule.

## 2015-11-13 NOTE — Telephone Encounter (Signed)
Pt is requesting the results from his sleep study.  CB#[phone]/[id number]

## 2015-11-14 ENCOUNTER — Encounter: Payer: Self-pay | Admitting: Family Medicine

## 2015-11-15 ENCOUNTER — Ambulatory Visit: Payer: BLUE CROSS/BLUE SHIELD | Attending: Otolaryngology

## 2015-11-15 DIAGNOSIS — R0683 Snoring: Secondary | ICD-10-CM | POA: Diagnosis not present

## 2015-11-15 DIAGNOSIS — G4733 Obstructive sleep apnea (adult) (pediatric): Secondary | ICD-10-CM | POA: Diagnosis not present

## 2015-11-16 DIAGNOSIS — F419 Anxiety disorder, unspecified: Secondary | ICD-10-CM | POA: Diagnosis not present

## 2015-11-16 DIAGNOSIS — F331 Major depressive disorder, recurrent, moderate: Secondary | ICD-10-CM | POA: Diagnosis not present

## 2015-11-30 DIAGNOSIS — G4733 Obstructive sleep apnea (adult) (pediatric): Secondary | ICD-10-CM | POA: Diagnosis not present

## 2015-12-08 ENCOUNTER — Encounter: Payer: Self-pay | Admitting: Family Medicine

## 2015-12-31 DIAGNOSIS — G4733 Obstructive sleep apnea (adult) (pediatric): Secondary | ICD-10-CM | POA: Diagnosis not present

## 2016-01-18 ENCOUNTER — Other Ambulatory Visit: Payer: Self-pay | Admitting: Cardiovascular Disease

## 2016-01-18 ENCOUNTER — Encounter: Payer: Self-pay | Admitting: Cardiovascular Disease

## 2016-01-18 ENCOUNTER — Ambulatory Visit (INDEPENDENT_AMBULATORY_CARE_PROVIDER_SITE_OTHER): Payer: BLUE CROSS/BLUE SHIELD | Admitting: Cardiovascular Disease

## 2016-01-18 VITALS — BP 142/93 | HR 76 | Ht 70.0 in | Wt 260.0 lb

## 2016-01-18 DIAGNOSIS — G4733 Obstructive sleep apnea (adult) (pediatric): Secondary | ICD-10-CM | POA: Diagnosis not present

## 2016-01-18 DIAGNOSIS — I2510 Atherosclerotic heart disease of native coronary artery without angina pectoris: Secondary | ICD-10-CM | POA: Diagnosis not present

## 2016-01-18 DIAGNOSIS — I10 Essential (primary) hypertension: Secondary | ICD-10-CM | POA: Diagnosis not present

## 2016-01-18 DIAGNOSIS — E785 Hyperlipidemia, unspecified: Secondary | ICD-10-CM

## 2016-01-18 DIAGNOSIS — Z9989 Dependence on other enabling machines and devices: Secondary | ICD-10-CM | POA: Insufficient documentation

## 2016-01-18 DIAGNOSIS — E7800 Pure hypercholesterolemia, unspecified: Secondary | ICD-10-CM

## 2016-01-18 DIAGNOSIS — I119 Hypertensive heart disease without heart failure: Secondary | ICD-10-CM

## 2016-01-18 MED ORDER — VALSARTAN 80 MG PO TABS: 80.0000 mg | ORAL_TABLET | Freq: Every day | ORAL | 6 refills | Status: DC

## 2016-01-18 NOTE — Progress Notes (Signed)
Patient ID: Eduardo Poole, male   DOB: Jan 05, 1971, 45 y.o.   MRN: [id number]     Primary MD: Dr. Lelon Huh Referring M.D.: Dr. Marcello Moores  PATIENT PROFILE: Eduardo Poole is a 45 y.o. male who presented for a six-month for follow-up cardiology evaluation  HPI:  Eduardo Poole suffered an acute coronary syndrome on June 2016.  At that time.  He initially presented Eduardo Poole where his ECG showed 2 mm of inferior ST segment elevation and a code STEMI was activated.  He also had transient TIA symptomatology.  Emergent catheterization revealed an ulcerated plaque in the distal RCA which had reperfused on anticoagulation therapy.  There was no residual high-grade stenosis.  PTCA alone was performed and the lesion was not stented.  His other coronary arteries were normal.  Left ventriculography showed normal LV function and a normal appearing aortic anatomy.  He was continued on Angiomax and started on aspirin and Brilinta.    An echo Doppler study showed an EF of 60-65% without wall motion abnormalities.  There was grade 1 diastolic dysfunction. Subsequently, he had noted some occasional PVCs but these have stabilized.  He developed some recurrent chest pain symptomatology in September 2016 which led to a nuclear stress test was wasn't interpreted as intermediate risk and suggesting possible scar/ischemia inferolaterally.  Repeat cardiac catheterization was done in 11/10/2014 and this revealed normal coronary arteries without evidence for restenosis in the distal RCA at the site of the prior ulcerated plaque.    Over the past year, he had continued to be stable.  He denies chest pain, shortness of breath, or palpitations.  He works as an Dispensing optician.  I saw him in May 2017 for preoperative clearance prior to undergoing cervical disc surgery.  He tolerated that well from a cardiovascular standpoint. He has remote history of suffering a DVT approximately 9 years ago and transiently  was treated with Coumadin.  When I saw him for evaluationconcerned about the possibility of sleep apnea.  Also, he underwent a sleep study which was done at [facility].  He was told of having sleep apnea.  He subsequently was started on CPAP therapy and since initiating therapy.  He feels his sleep is significantly improved.  He is sleeping on average at least 7-1/2-8 hours per night.  He is unaware of breakthrough snoring.  He admits to 100% compliance.  He has a ResMed AirSense 10 unit with heated hose.  He presented to [facility] emergency room in September with somewhat different chest pain.  At that time, he admitted that he was under significant stress that was work-related.  Troponins and CT scan of his chest were negative.  He has since changed jobs.  He does the same type of work but works at home.  He tries to be active and intermittently walks on a treadmill but does yard work and recently went hunting for several days when he was walking up to 3 miles per day without chest pain development.  He has been bothered by some residual neck discomfort.  He presents for evaluation.   Past Medical History:  Diagnosis Date  . CAD (coronary artery disease)    a. 07/2014 Inf STEMI/PTCA:  LM nl, LAD nl, LCX nl, RCA ulcerated plaque (PTCA only);  b. 10/2014 MV: intermediate risk, ? inflat isch/scar;  c. 10/2014 Cath: Nl cors w/o RCA restenosis.  . Diastolic dysfunction    a. 07/2014 Echo: EF 60-65%, Gr1 DD.  Marland Kitchen  DVT of lower extremity (deep venous thrombosis) ([place])    a. ~ 2009, per pt was on lovenox/coumadin  . Hyperlipidemia   . Hypertension   . Hypertensive heart disease   . TIA (transient ischemic attack) 07/23/2014    Past Surgical History:  Procedure Laterality Date  . ARTHROPLASTY Right 07/09/2010  . CARDIAC CATHETERIZATION N/A 07/23/2014   Procedure: Left Heart Cath;  Surgeon: Troy Sine, MD;  Location: [place] [facility];  Service: Cardiovascular;  Laterality: N/A;  .  CARDIAC CATHETERIZATION N/A 07/23/2014   Procedure: Coronary Balloon Angioplasty;  Surgeon: Troy Sine, MD;  Location: [place] [facility];  Service: Cardiovascular;  Laterality: N/A;  . CARDIAC CATHETERIZATION N/A 11/10/2014   Procedure: Left Heart Cath and Coronary Angiography;  Surgeon: Troy Sine, MD;  Location: [place] [facility];  Service: Cardiovascular;  Laterality: N/A;  . JOINT REPLACEMENT    . MENISCUS REPAIR Right 11/03/1995   Meniscus Lateral Tear: repaired by Dr. Sabra Heck at [facility]  . NASAL SINUS SURGERY     Deviated Septum  . REPLACEMENT TOTAL KNEE Right 2011   [facility]    No Known Allergies  Current Outpatient Prescriptions  Medication Sig Dispense Refill  . aspirin EC 81 MG EC tablet Take 1 tablet (81 mg total) by mouth daily. (Patient taking differently: Take 81 mg by mouth at bedtime. )    . atorvastatin (LIPITOR) 80 MG tablet TAKE 1 TABLET BY MOUTH DAILY (Patient taking differently: TAKE 1 TABLET BY MOUTH at night) 90 tablet 1  . clopidogrel (PLAVIX) 75 MG tablet Take 1 tablet (75 mg total) by mouth daily. 30 tablet 11  . metoprolol tartrate (LOPRESSOR) 25 MG tablet Take 1 tablet by mouth 2 (two) times daily.    . nitroGLYCERIN (NITROSTAT) 0.4 MG SL tablet Place 1 tablet (0.4 mg total) under the tongue every 5 (five) minutes as needed for chest pain. 25 tablet 12  . pantoprazole (PROTONIX) 40 MG tablet Take 1 tablet (40 mg total) by mouth daily. (Patient taking differently: Take 40 mg by mouth at bedtime. ) 30 tablet 6   No current facility-administered medications for this visit.     Social History   Social History  . Marital status: Married    Spouse name: N/A  . Number of children: 2  . Years of education: 12   Occupational History  . Employed     Works at [employer]  . Smoking status: Never Smoker  . Smokeless tobacco: Former Systems developer    Types: Chew  . Alcohol use No  . Drug use: No  .  Sexual activity: Not on file   Other Topics Concern  . Not on file   Social History Narrative  . No narrative on file    Family History  Problem Relation Age of Onset  . Hypertension Mother   . Arthritis Mother   . Diverticulitis Mother   . Gout Father   . Diabetes Paternal Grandfather     ROS General: Negative; No fevers, chills, or night sweats HEENT: Negative; No changes in vision or hearing, sinus congestion, difficulty swallowing Pulmonary: Negative; No cough, wheezing, shortness of breath, hemoptysis Cardiovascular:  See HPI;  GI: Negative; No nausea, vomiting, diarrhea, or abdominal pain GU: Negative; No dysuria, hematuria, or difficulty voiding Musculoskeletal: Positive for cervical disc disease  Hematologic/Oncologic: Negative; no easy bruising, bleeding Endocrine: Negative; no heat/cold intolerance; no diabetes Neuro: Negative; no changes  in balance, headaches Skin: Negative; No rashes or skin lesions Psychiatric: Negative; No behavioral problems, depression Sleep: Negative; No daytime sleepiness, hypersomnolence, bruxism, restless legs, hypnogagnic hallucinations Other comprehensive 14 point system review is negative   Physical Exam Ht _0  (1.778 m)   Wt 260 lb (117.9 kg)   BMI 37.31 kg/m   Wt Readings from Last 3 Encounters:  01/18/16 260 lb (117.9 kg)  11/07/15 258 lb (117 kg)  10/17/15 258 lb (117 kg)   General: Alert, oriented, no distress.  Skin: normal turgor, no rashes, warm and dry HEENT: Normocephalic, atraumatic. Pupils equal round and reactive to light; sclera anicteric; extraocular muscles intact; Fundi without hemorrhages or exudates; disc flat Nose without nasal septal hypertrophy Mouth/Parynx benign; Mallinpatti scale 3 Neck: No JVD, no carotid bruits; normal carotid upstroke Lungs: clear to ausculatation and percussion; no wheezing or rales Chest wall: without tenderness to palpitation Heart: PMI not displaced, RRR, s1 s2 normal, 1/6  systolic murmur, no diastolic murmur, no rubs, gallops, thrills, or heaves Abdomen: soft, nontender; no hepatosplenomehaly, BS+; abdominal aorta nontender and not dilated by palpation. Back: no CVA tenderness Pulses 2+ Musculoskeletal: full range of motion, normal strength, no joint deformities Extremities: no clubbing cyanosis or edema, Homan's sign negative  Neurologic: grossly nonfocal; Cranial nerves grossly wnl Psychologic: Normal mood and affect  ECG (independently read by me): Sinus rhythm at 78 bpm.  Mild RV conduction delay.  Intervals normal.   May 2017 ECG (independently read by me): ECG from 07/12/2015.  As part of his preoperative screening showed normal sinus rhythm at 71 bpm.  There was incomplete right bundle branch block.  Left axis deviation.  LABS:  BMP Latest Ref Rng & Units 11/07/2015 09/19/2015 11/09/2014  Glucose 65 - 99 mg/dL 111(H) 100(H) 105(H)  BUN 6 - 20 mg/dL 22(H) 19 16  Creatinine 0.61 - 1.24 mg/dL 0.96 1.01 1.06  BUN/Creat Ratio 9 - 20 - 19 15  Sodium 135 - 145 mmol/L 137 140 141  Potassium 3.5 - 5.1 mmol/L 4.0 4.8 4.7  Chloride 101 - 111 mmol/L 106 101 101  CO2 22 - 32 mmol/L _1 Calcium 8.9 - 10.3 mg/dL 9.0 9.4 9.6     Hepatic Function Latest Ref Rng & Units 11/07/2015 09/19/2015 10/18/2014  Total Protein 6.5 - 8.1 g/dL 7.2 7.0 7.3  Albumin 3.5 - 5.0 g/dL 4.1 4.2 4.5  AST 15 - 41 U/L _2 ALT 17 - 63 U/L 35 35 50(H)  Alk Phosphatase 38 - 126 U/L 52 73 77  Total Bilirubin 0.3 - 1.2 mg/dL 0.6 0.4 0.5  Bilirubin, Direct 0.1 - 0.5 mg/dL <0.1(L) - -    CBC Latest Ref Rng & Units 11/07/2015 09/19/2015 11/09/2014  WBC 3.8 - 10.6 K/uL 11.8(H) 6.7 7.9  Hemoglobin 13.0 - 18.0 g/dL 14.9 - -  Hematocrit 40.0 - 52.0 % 43.5 41.8 41.8  Platelets 150 - 440 K/uL 305 300 290   Lab Results  Component Value Date   MCV 88.2 11/07/2015   MCV 89 09/19/2015   MCV 90 11/09/2014   Lab Results  Component Value Date   TSH 2.900 10/18/2014   Lab Results    Component Value Date   HGBA1C 5.8 (H) 07/24/2014     BNP No results found for: BNP  ProBNP No results found for: PROBNP   Lipid Panel     Component Value Date/Time   CHOL 120 09/19/2015 1009   TRIG 152 (H) 09/19/2015 1009  HDL 28 (L) 09/19/2015 1009   CHOLHDL 4.3 09/19/2015 1009   CHOLHDL 7.2 07/24/2014 0417   VLDL 34 07/24/2014 0417   LDLCALC 62 09/19/2015 1009    RADIOLOGY: No results found.   ASSESSMENT AND PLAN: Mr. Eduardo Poole is a 45 year old gentleman who suffered a remote DVT approximately 9 years ago and was on anticoagulation therapy short-term.  He developed an acute coronary syndrome on 07/23/2014 and was found to have an ulcerated plaque but without significant residual stenosis following initial anticoagulation therapy.  This was successfully treated with PTCA, but was not stented.  Repeat catheterization for somewhat atypical chest pain after nuclear study suggested possible inferolateral ischemia revealed entirely normal coronary arteries.  He was initially treated with aspirin and Briollinta.  After one year, he was switched to Plavix.  Since I saw him, he underwent successful neck surgery without cardiovascular compromise.  He was diagnosed with sleep apnea and now omits to 100% compliance with CPAP therapy.  He had been seen by Jorja Loa in August and attempted switching his metoprolol, tartrate to bisoprolol 5 mg daily.  However, his blood pressure became elevated on this and the patient reverted back to the metoprolol tartrate 25 mg twice a day.  His blood pressure today is mildly elevated at 142/90.  I discussed with him recent change in blood pressure recommendations.  His resting pulse is 78.  I will add valsartan 80 mg daily initially, but he may ultimately require 160 mg.  In 2 week he will undergo a CMP and follow-up lipid studies.  He continues to be on atorvastatin 80 mg daily for hyperlipidemia.  His BMI is increased at 37.31 and is consistent with  moderate obesity.  We discussed the importance of weight loss.  We discussed the importance of aerobic exercise.  He will monitor his blood pressure at home and if his blood pressure remains elevated, he will contact our office.  I will see him in 3 months for cardiology  reevaluation.    Time spent: 25 minutes  Troy Sine, MD, [facility] 01/18/2016 8:01 AM

## 2016-01-18 NOTE — Patient Instructions (Signed)
Your physician recommends that you return for lab work in: 2 weeks fasting.  Your physician has recommended you make the following change in your medication:   1.) start new prescription for valsartan.  Your physician recommends that you schedule a follow-up appointment in: 3 months with Dr Claiborne Billings.

## 2016-01-30 DIAGNOSIS — G4733 Obstructive sleep apnea (adult) (pediatric): Secondary | ICD-10-CM | POA: Diagnosis not present

## 2016-02-06 ENCOUNTER — Telehealth: Payer: Self-pay | Admitting: Cardiovascular Disease

## 2016-02-06 DIAGNOSIS — I10 Essential (primary) hypertension: Secondary | ICD-10-CM | POA: Diagnosis not present

## 2016-02-06 DIAGNOSIS — E785 Hyperlipidemia, unspecified: Secondary | ICD-10-CM | POA: Diagnosis not present

## 2016-02-06 NOTE — Telephone Encounter (Signed)
Called [place]. She states patient was non-fasting but insisted that he have the labs drawn. Will route to [place], [place] as FYI that results will be non-fasting.

## 2016-02-06 NOTE — Telephone Encounter (Signed)
Does pt needs to fast for Lipid test?Pt is there waiting.

## 2016-02-07 LAB — COMPREHENSIVE METABOLIC PANEL
ALBUMIN: 4.1 g/dL (ref 3.6–5.1)
ALT: 35 U/L (ref 9–46)
AST: 24 U/L (ref 10–40)
Alkaline Phosphatase: 60 U/L (ref 40–115)
BUN: 17 mg/dL (ref 7–25)
CHLORIDE: 107 mmol/L (ref 98–110)
CO2: 25 mmol/L (ref 20–31)
CREATININE: 0.98 mg/dL (ref 0.60–1.35)
Calcium: 9.2 mg/dL (ref 8.6–10.3)
Glucose, Bld: 103 mg/dL — ABNORMAL HIGH (ref 65–99)
POTASSIUM: 4.1 mmol/L (ref 3.5–5.3)
SODIUM: 140 mmol/L (ref 135–146)
Total Bilirubin: 0.4 mg/dL (ref 0.2–1.2)
Total Protein: 6.7 g/dL (ref 6.1–8.1)

## 2016-02-07 LAB — LIPID PANEL
CHOL/HDL RATIO: 3.9 ratio (ref <5.0)
Cholesterol: 104 mg/dL (ref <200)
HDL: 27 mg/dL — ABNORMAL LOW (ref >40)
LDL CALC: 47 mg/dL (ref <100)
TRIGLYCERIDES: 148 mg/dL (ref <150)
VLDL: 30 mg/dL (ref <30)

## 2016-02-23 ENCOUNTER — Encounter: Payer: Self-pay | Admitting: *Deleted

## 2016-03-01 DIAGNOSIS — G4733 Obstructive sleep apnea (adult) (pediatric): Secondary | ICD-10-CM | POA: Diagnosis not present

## 2016-03-04 ENCOUNTER — Ambulatory Visit (INDEPENDENT_AMBULATORY_CARE_PROVIDER_SITE_OTHER): Payer: BLUE CROSS/BLUE SHIELD | Admitting: Family Medicine

## 2016-03-04 ENCOUNTER — Encounter: Payer: Self-pay | Admitting: Family Medicine

## 2016-03-04 VITALS — BP 142/80 | HR 84 | Temp 98.1°F | Resp 16 | Wt 255.0 lb

## 2016-03-04 DIAGNOSIS — L03811 Cellulitis of head [any part, except face]: Secondary | ICD-10-CM | POA: Diagnosis not present

## 2016-03-04 MED ORDER — AMOXICILLIN-POT CLAVULANATE 875-125 MG PO TABS: 1.0000 | ORAL_TABLET | Freq: Two times a day (BID) | ORAL | 0 refills | Status: AC

## 2016-03-04 NOTE — Progress Notes (Signed)
       Patient: Eduardo Poole Male    DOB: Aug 22, 1970   46 y.o.   MRN: [id number] Visit Date: 03/04/2016  Today's Provider: Lelon Huh, MD   Chief Complaint  Patient presents with  . Ear Pain   Subjective:    HPI Patient reports that he has a knot behind his left ear X 1 week. Patient reports that it is very tender to touch. Patient reports that it has also made it hard to sleep at night. He reports that he has been taking Tylenol and warm compresses to help with the pain.     No Known Allergies   Current Outpatient Prescriptions:  .  aspirin EC 81 MG EC tablet, Take 1 tablet (81 mg total) by mouth daily. (Patient taking differently: Take 81 mg by mouth at bedtime. ), Disp: , Rfl:  .  atorvastatin (LIPITOR) 80 MG tablet, TAKE ONE TABLET BY MOUTH EVERY DAY, Disp: 90 tablet, Rfl: 3 .  clopidogrel (PLAVIX) 75 MG tablet, Take 1 tablet (75 mg total) by mouth daily., Disp: 30 tablet, Rfl: 11 .  metoprolol tartrate (LOPRESSOR) 25 MG tablet, Take 1 tablet by mouth 2 (two) times daily., Disp: , Rfl:  .  nitroGLYCERIN (NITROSTAT) 0.4 MG SL tablet, Place 1 tablet (0.4 mg total) under the tongue every 5 (five) minutes as needed for chest pain., Disp: 25 tablet, Rfl: 12 .  valsartan (DIOVAN) 80 MG tablet, Take 1 tablet (80 mg total) by mouth daily., Disp: 30 tablet, Rfl: 6 .  pantoprazole (PROTONIX) 40 MG tablet, Take 1 tablet (40 mg total) by mouth daily. (Patient not taking: Reported on 03/04/2016), Disp: 30 tablet, Rfl: 6  Review of Systems  Constitutional: Negative for activity change, appetite change, chills, diaphoresis, fatigue, fever and unexpected weight change.  Skin: Negative for color change.  Neurological: Negative for dizziness, light-headedness, numbness and headaches.    Social History  Substance Use Topics  . Smoking status: Never Smoker  . Smokeless tobacco: Former Systems developer    Types: Chew  . Alcohol use No   Objective:   BP (!) 142/80 (BP Location: Left Arm, Patient  Position: Sitting, Cuff Size: Large)   Pulse 84   Temp 98.1 F (36.7 C)   Resp 16   Wt 255 lb (115.7 kg)   BMI 36.59 kg/m   Physical Exam  General Appearance:    Alert, cooperative, no distress  HENT:   Several nodules on left side of scalp, tender, with LAD behind left external ear. Mild erythema around scalp nodules.   Eyes:    PERRL, conjunctiva/corneas clear, EOM's intact       Lungs:     Clear to auscultation bilaterally, respirations unlabored  Heart:    Regular rate and rhythm  Neurologic:   Awake, alert, oriented x 3. No apparent focal neurological           defect.           Assessment & Plan:     1. Cellulitis of head except face  - amoxicillin-clavulanate (AUGMENTIN) 875-125 MG tablet; Take 1 tablet by mouth 2 (two) times daily.  Dispense: 20 tablet; Refill: 0  Call if symptoms change or if not rapidly improving.          Lelon Huh, MD  [facility]

## 2016-03-12 ENCOUNTER — Telehealth: Payer: Self-pay

## 2016-03-12 MED ORDER — LEVOFLOXACIN 500 MG PO TABS: 500.0000 mg | ORAL_TABLET | Freq: Every day | ORAL | 0 refills | Status: DC

## 2016-03-12 NOTE — Telephone Encounter (Signed)
Patient was notified. Rx was sent to pharmacy.  

## 2016-03-12 NOTE — Telephone Encounter (Signed)
Please advise 

## 2016-03-12 NOTE — Telephone Encounter (Signed)
Pt was seen on 03/04/2016 for cellulitis of head and was treated with Augmentin. Pt reports his sx are not improving, and he only has 2 days of abx left. CB# [phone]. Renaldo Fiddler, CMA

## 2016-03-12 NOTE — Telephone Encounter (Signed)
Can change antibiotic to levofloxacin 500mg  once a day for 7 days. If it worsens, or if not better when finished with antibiotic will need referral to ENT.

## 2016-04-11 ENCOUNTER — Encounter (INDEPENDENT_AMBULATORY_CARE_PROVIDER_SITE_OTHER): Payer: Self-pay

## 2016-04-11 ENCOUNTER — Encounter: Payer: Self-pay | Admitting: Cardiovascular Disease

## 2016-04-11 ENCOUNTER — Ambulatory Visit (INDEPENDENT_AMBULATORY_CARE_PROVIDER_SITE_OTHER): Payer: BLUE CROSS/BLUE SHIELD | Admitting: Cardiovascular Disease

## 2016-04-11 VITALS — BP 127/86 | HR 73 | Ht 70.0 in | Wt 255.0 lb

## 2016-04-11 DIAGNOSIS — IMO0001 Reserved for inherently not codable concepts without codable children: Secondary | ICD-10-CM

## 2016-04-11 DIAGNOSIS — I10 Essential (primary) hypertension: Secondary | ICD-10-CM | POA: Diagnosis not present

## 2016-04-11 DIAGNOSIS — G4733 Obstructive sleep apnea (adult) (pediatric): Secondary | ICD-10-CM | POA: Diagnosis not present

## 2016-04-11 DIAGNOSIS — E6609 Other obesity due to excess calories: Secondary | ICD-10-CM | POA: Diagnosis not present

## 2016-04-11 DIAGNOSIS — I2510 Atherosclerotic heart disease of native coronary artery without angina pectoris: Secondary | ICD-10-CM | POA: Diagnosis not present

## 2016-04-11 DIAGNOSIS — E785 Hyperlipidemia, unspecified: Secondary | ICD-10-CM | POA: Diagnosis not present

## 2016-04-11 DIAGNOSIS — Z6835 Body mass index (BMI) 35.0-35.9, adult: Secondary | ICD-10-CM | POA: Diagnosis not present

## 2016-04-11 DIAGNOSIS — K219 Gastro-esophageal reflux disease without esophagitis: Secondary | ICD-10-CM

## 2016-04-11 NOTE — Patient Instructions (Signed)
Your physician wants you to follow-up in: 6 months or sooner if needed. You will receive a reminder letter in the mail two months in advance. If you don't receive a letter, please call our office to schedule the follow-up appointment.   If you need a refill on your cardiac medications before your next appointment, please call your pharmacy. 

## 2016-04-11 NOTE — Progress Notes (Signed)
Patient ID: Eduardo Poole, male   DOB: Dec 02, 1970, 46 y.o.   MRN: [id number]     Primary MD: Dr. Lelon Huh Referring M.D.: Dr. Marcello Moores  PATIENT PROFILE: Eduardo Poole is a 46 y.o. male who presented for a 3 month for follow-up cardiology evaluation  HPI:  Eduardo Poole suffered an acute coronary syndrome on June 2016.  At that time.  He initially presented [place] where his ECG showed 2 mm of inferior ST segment elevation and a code STEMI was activated.  He also had transient TIA symptomatology.  Emergent catheterization revealed an ulcerated plaque in the distal RCA which had reperfused on anticoagulation therapy.  There was no residual high-grade stenosis.  PTCA alone was performed and the lesion was not stented.  His other coronary arteries were normal.  Left ventriculography showed normal LV function and a normal appearing aortic anatomy.  He was continued on Angiomax and started on aspirin and Brilinta.    An echo Doppler study showed an EF of 60-65% without wall motion abnormalities.  There was grade 1 diastolic dysfunction. Subsequently, he had noted some occasional PVCs but these have stabilized.  He developed some recurrent chest pain symptomatology in September 2016 which led to a nuclear stress test was wasn't interpreted as intermediate risk and suggesting possible scar/ischemia inferolaterally.  Repeat cardiac catheterization was done in 11/10/2014 and this revealed normal coronary arteries without evidence for restenosis in the distal RCA at the site of the prior ulcerated plaque.    Over the past year, he had continued to be stable.  He denies chest pain, shortness of breath, or palpitations.  He works as an Dispensing optician.  I saw him in May 2017 for preoperative clearance prior to undergoing cervical disc surgery.  He tolerated that well from a cardiovascular standpoint. He has remote history of suffering a DVT approximately 9 years ago and transiently  was treated with Coumadin.  When I saw him for evaluationconcerned about the possibility of sleep apnea.  Also, he underwent a sleep study which was done at [facility].  He was told of having sleep apnea.  He subsequently was started on CPAP therapy and since initiating therapy.  He feels his sleep is significantly improved.  He is sleeping on average at least 7-1/2-8 hours per night.  He is unaware of breakthrough snoring.  He admits to 100% compliance.  He has a ResMed AirSense 10 unit with heated hose.  He presented to [facility] emergency room in September 2017 with somewhat different chest pain.  At that time, he admitted that he was under significant stress that was work-related.  Troponins and CT scan of his chest were negative.  He has since changed jobs.  He does the same type of work but works at home.  He tries to be active and intermittently walks on a treadmill and does yard work.  Since I last saw him, he has been bothered by DJD of his thoracic back.  He continues to use CPAP with 100% compliance and feels significantly better.  I was able to review his CPAP study from September 2017 done at [facility].  He was titrated up to 13 cm water pressure.  He has lost 5 pounds.  He has not been exercising regularly.  Repeat blood work on atorvastatin 80 mg showed excellent lipid studies with a total cholesterol 104, LDL 47.  HDL is low at 27.  Triglycerides were 148.  He presents for  evaluation.   Past Medical History:  Diagnosis Date  . CAD (coronary artery disease)    a. 07/2014 Inf STEMI/PTCA:  LM nl, LAD nl, LCX nl, RCA ulcerated plaque (PTCA only);  b. 10/2014 MV: intermediate risk, ? inflat isch/scar;  c. 10/2014 Cath: Nl cors w/o RCA restenosis.  . Diastolic dysfunction    a. 07/2014 Echo: EF 60-65%, Gr1 DD.  . DVT of lower extremity (deep venous thrombosis) ([place])    a. ~ 2009, per pt was on lovenox/coumadin  . Hyperlipidemia   . Hypertension   . Hypertensive  heart disease   . TIA (transient ischemic attack) 07/23/2014    Past Surgical History:  Procedure Laterality Date  . ARTHROPLASTY Right 07/09/2010  . CARDIAC CATHETERIZATION N/A 07/23/2014   Procedure: Left Heart Cath;  Surgeon: Troy Sine, MD;  Location: [place] [facility];  Service: Cardiovascular;  Laterality: N/A;  . CARDIAC CATHETERIZATION N/A 07/23/2014   Procedure: Coronary Balloon Angioplasty;  Surgeon: Troy Sine, MD;  Location: [place] [facility];  Service: Cardiovascular;  Laterality: N/A;  . CARDIAC CATHETERIZATION N/A 11/10/2014   Procedure: Left Heart Cath and Coronary Angiography;  Surgeon: Troy Sine, MD;  Location: [place] [facility];  Service: Cardiovascular;  Laterality: N/A;  . JOINT REPLACEMENT    . MENISCUS REPAIR Right 11/03/1995   Meniscus Lateral Tear: repaired by Dr. Sabra Heck at [facility]  . NASAL SINUS SURGERY     Deviated Septum  . REPLACEMENT TOTAL KNEE Right 2011   [facility]    No Known Allergies  Current Outpatient Prescriptions  Medication Sig Dispense Refill  . aspirin EC 81 MG EC tablet Take 1 tablet (81 mg total) by mouth daily. (Patient taking differently: Take 81 mg by mouth at bedtime. )    . atorvastatin (LIPITOR) 80 MG tablet TAKE ONE TABLET BY MOUTH EVERY DAY 90 tablet 3  . clopidogrel (PLAVIX) 75 MG tablet Take 1 tablet (75 mg total) by mouth daily. 30 tablet 11  . levofloxacin (LEVAQUIN) 500 MG tablet Take 1 tablet (500 mg total) by mouth daily. 7 tablet 0  . metoprolol tartrate (LOPRESSOR) 25 MG tablet Take 1 tablet by mouth 2 (two) times daily.    . nitroGLYCERIN (NITROSTAT) 0.4 MG SL tablet Place 1 tablet (0.4 mg total) under the tongue every 5 (five) minutes as needed for chest pain. 25 tablet 12  . pantoprazole (PROTONIX) 40 MG tablet Take 1 tablet (40 mg total) by mouth daily. 30 tablet 6  . sertraline (ZOLOFT) 100 MG tablet Take 50 mg by mouth at bedtime as needed.    . valsartan (DIOVAN) 80 MG tablet  Take 1 tablet (80 mg total) by mouth daily. 30 tablet 6   No current facility-administered medications for this visit.     Social History   Social History  . Marital status: Married    Spouse name: N/A  . Number of children: 2  . Years of education: 12   Occupational History  . Employed     Works at [employer]  . Smoking status: Never Smoker  . Smokeless tobacco: Former Systems developer    Types: Chew  . Alcohol use No  . Drug use: No  . Sexual activity: Not on file   Other Topics Concern  . Not on file   Social History Narrative  . No narrative on file    Family History  Problem Relation Age of Onset  .  Hypertension Mother   . Arthritis Mother   . Diverticulitis Mother   . Gout Father   . Diabetes Paternal Grandfather     ROS General: Negative; No fevers, chills, or night sweats HEENT: Negative; No changes in vision or hearing, sinus congestion, difficulty swallowing Pulmonary: Negative; No cough, wheezing, shortness of breath, hemoptysis Cardiovascular:  See HPI;  GI: Negative; No nausea, vomiting, diarrhea, or abdominal pain GU: Negative; No dysuria, hematuria, or difficulty voiding Musculoskeletal: Positive for cervical disc disease; history of right knee replacement  Hematologic/Oncologic: Negative; no easy bruising, bleeding Endocrine: Negative; no heat/cold intolerance; no diabetes Neuro: Negative; no changes in balance, headaches Skin: Negative; No rashes or skin lesions Psychiatric: Negative; No behavioral problems, depression Sleep: Negative; No daytime sleepiness, hypersomnolence, bruxism, restless legs, hypnogagnic hallucinations Other comprehensive 14 point system review is negative   Physical Exam BP 127/86   Pulse 73   Ht 5' 10"  (1.778 m)   Wt 255 lb (115.7 kg)   SpO2 97%   BMI 36.59 kg/m   Wt Readings from Last 3 Encounters:  04/11/16 255 lb (115.7 kg)  03/04/16 255 lb (115.7 kg)  01/18/16 260 lb (117.9 kg)    General: Alert, oriented, no distress.  Skin: normal turgor, no rashes, warm and dry HEENT: Normocephalic, atraumatic. Pupils equal round and reactive to light; sclera anicteric; extraocular muscles intact; Fundi without hemorrhages or exudates; disc flat Nose without nasal septal hypertrophy Mouth/Parynx benign; Mallinpatti scale 3 Neck: No JVD, no carotid bruits; normal carotid upstroke Lungs: clear to ausculatation and percussion; no wheezing or rales Chest wall: without tenderness to palpitation Heart: PMI not displaced, RRR, s1 s2 normal, 1/6 systolic murmur, no diastolic murmur, no rubs, gallops, thrills, or heaves Abdomen: soft, nontender; no hepatosplenomehaly, BS+; abdominal aorta nontender and not dilated by palpation. Back: no CVA tenderness Pulses 2+ Musculoskeletal: full range of motion, normal strength, no joint deformities Extremities: no clubbing cyanosis or edema, Homan's sign negative  Neurologic: grossly nonfocal; Cranial nerves grossly wnl Psychologic: Normal mood and affect  ECG (independently read by me): Normal sinus rhythm at 73 bpm.  Normal EKG.  Normal intervals.  November 2017 ECG (independently read by me): Sinus rhythm at 78 bpm.  Mild RV conduction delay.  Intervals normal.  May 2017 ECG (independently read by me): ECG from 07/12/2015.  As part of his preoperative screening showed normal sinus rhythm at 71 bpm.  There was incomplete right bundle branch block.  Left axis deviation.  LABS:  BMP Latest Ref Rng & Units 02/06/2016 11/07/2015 09/19/2015  Glucose 65 - 99 mg/dL 103(H) 111(H) 100(H)  BUN 7 - 25 mg/dL 17 22(H) 19  Creatinine 0.60 - 1.35 mg/dL 0.98 0.96 1.01  BUN/Creat Ratio 9 - 20 - - 19  Sodium 135 - 146 mmol/L 140 137 140  Potassium 3.5 - 5.3 mmol/L 4.1 4.0 4.8  Chloride 98 - 110 mmol/L 107 106 101  CO2 20 - 31 mmol/L 25 22 23   Calcium 8.6 - 10.3 mg/dL 9.2 9.0 9.4     Hepatic Function Latest Ref Rng & Units 02/06/2016 11/07/2015 09/19/2015   Total Protein 6.1 - 8.1 g/dL 6.7 7.2 7.0  Albumin 3.6 - 5.1 g/dL 4.1 4.1 4.2  AST 10 - 40 U/L 24 24 29   ALT 9 - 46 U/L 35 35 35  Alk Phosphatase 40 - 115 U/L 60 52 73  Total Bilirubin 0.2 - 1.2 mg/dL 0.4 0.6 0.4  Bilirubin, Direct 0.1 - 0.5 mg/dL - <0.1(L) -  CBC Latest Ref Rng & Units 11/07/2015 09/19/2015 11/09/2014  WBC 3.8 - 10.6 K/uL 11.8(H) 6.7 7.9  Hemoglobin 13.0 - 18.0 g/dL 14.9 - -  Hematocrit 40.0 - 52.0 % 43.5 41.8 41.8  Platelets 150 - 440 K/uL 305 300 290   Lab Results  Component Value Date   MCV 88.2 11/07/2015   MCV 89 09/19/2015   MCV 90 11/09/2014   Lab Results  Component Value Date   TSH 2.900 10/18/2014   Lab Results  Component Value Date   HGBA1C 5.8 (H) 07/24/2014     BNP No results found for: BNP  ProBNP No results found for: PROBNP   Lipid Panel     Component Value Date/Time   CHOL 104 02/06/2016 1058   CHOL 120 09/19/2015 1009   TRIG 148 02/06/2016 1058   HDL 27 (L) 02/06/2016 1058   HDL 28 (L) 09/19/2015 1009   CHOLHDL 3.9 02/06/2016 1058   VLDL 30 02/06/2016 1058   LDLCALC 47 02/06/2016 1058   LDLCALC 62 09/19/2015 1009    RADIOLOGY: No results found.  IMPRESSION:  1. Coronary artery disease involving native coronary artery of native heart without angina pectoris   2. Essential hypertension   3. OSA (obstructive sleep apnea)   4. Gastroesophageal reflux disease, esophagitis presence not specified   5. Hyperlipidemia LDL goal <70   6. Class 2 obesity due to excess calories with serious comorbidity and body mass index (BMI) of 35.0 to 35.9 in adult     ASSESSMENT AND PLAN: Mr. Eduardo Poole is a 46 year old gentleman who suffered a remote DVT approximately 9 years ago and was on anticoagulation therapy short-term.  He developed an acute coronary syndrome on 07/23/2014 and was found to have an ulcerated plaque but without significant residual stenosis following initial anticoagulation therapy.  This was successfully treated  with PTCA, but was not stented.  Repeat catheterization for  atypical chest pain after nuclear study suggested possible inferolateral ischemia revealed entirely normal coronary arteries.  He was initially treated with aspirin and Brilinta.  After one year, he was switched to Plavix.  He underwent successful neck surgery without cardiovascular compromise.  He was diagnosed with sleep apnea and now omits to 100% compliance with CPAP therapy.  I reviewed his CPAP titration study from [place].  He had seen by Jorja Loa in August 2017 who attempted switching his metoprolol tartrate to bisoprolol 5 mg daily.  However, his blood pressure became elevated on this and the patient reverted back to the metoprolol tartrate 25 mg twice a day.  When I last saw him, his blood pressure was elevated and I initiated valsartan 80 mg to take in addition to his metoprolol therapy.  His blood pressure today is improved and on repeat by me was 122/82.  He is tolerating this well.  His Route lipid studies show an excellent LDL at 47, although his HDL remains low.  I have suggested increased exercise, such that hopefully he will be able to exercise 5 days per week.  He is bothered by his back and DJD discomfort.  He also takes Protonix.  He has a history of a hiatal hernia and remotely was demonstrated to have stomach erosions.  He denies recent bleeding.  BMI is increased at 36.59 and is consistent with moderate obesity.  I commended him on his recent 5 pound weight loss but encourage additional weight loss.  I will see him in 6 months for reevaluation. Time spent: 25 minutes  Marcello Moores  A. Claiborne Billings, MD, [facility] 04/13/2016 11:29 AM

## 2016-05-17 ENCOUNTER — Other Ambulatory Visit: Payer: Self-pay

## 2016-05-17 MED ORDER — PANTOPRAZOLE SODIUM 40 MG PO TBEC: 40.0000 mg | DELAYED_RELEASE_TABLET | Freq: Every day | ORAL | 10 refills | Status: DC

## 2016-05-30 ENCOUNTER — Other Ambulatory Visit: Payer: Self-pay | Admitting: Family Medicine

## 2016-07-22 ENCOUNTER — Telehealth: Payer: Self-pay | Admitting: Cardiovascular Disease

## 2016-07-22 MED ORDER — METOPROLOL TARTRATE 25 MG PO TABS: 25.0000 mg | ORAL_TABLET | Freq: Two times a day (BID) | ORAL | 3 refills | Status: DC

## 2016-07-22 MED ORDER — PANTOPRAZOLE SODIUM 40 MG PO TBEC: 40.0000 mg | DELAYED_RELEASE_TABLET | Freq: Every day | ORAL | 3 refills | Status: DC

## 2016-07-22 MED ORDER — ATORVASTATIN CALCIUM 80 MG PO TABS: 80.0000 mg | ORAL_TABLET | Freq: Every day | ORAL | 3 refills | Status: DC

## 2016-07-22 MED ORDER — CLOPIDOGREL BISULFATE 75 MG PO TABS: 75.0000 mg | ORAL_TABLET | Freq: Every day | ORAL | 3 refills | Status: DC

## 2016-07-22 MED ORDER — VALSARTAN 80 MG PO TABS: 80.0000 mg | ORAL_TABLET | Freq: Every day | ORAL | 3 refills | Status: DC

## 2016-07-22 NOTE — Telephone Encounter (Signed)
New message      *STAT* If patient is at the pharmacy, call can be transferred to refill team.   1. Which medications need to be refilled? (please list name of each medication and dose if known)   metoprolol tartrate (LOPRESSOR) 25 MG tablet TAKE ONE TABLET BY MOUTH TWICE DAILY   c clopidogrel (PLAVIX) 75 MG tablet Take 1 tablet (75 mg total) by mouth daily    atorvastatin (LIPITOR) 80 MG tablet TAKE ONE TABLET BY MOUTH EVERY DAY    valsartan (DIOVAN) 80 MG tablet Take 1 tablet (80 mg total) by mouth daily.   pantoprazole (PROTONIX) 40 MG tablet Take 1 tablet (40 mg total) by mouth daily     2. Which pharmacy/location (including street and city if local pharmacy) is medication to be sent to? [facility] [place]  3. Do they need a 30 day or 90 day supply? Needs all prescriptions with 90 days supply due to insurance change

## 2016-07-22 NOTE — Telephone Encounter (Signed)
Refill sent to the pharmacy electronically.  

## 2016-09-17 ENCOUNTER — Encounter: Payer: Self-pay | Admitting: Family Medicine

## 2016-09-17 ENCOUNTER — Ambulatory Visit (INDEPENDENT_AMBULATORY_CARE_PROVIDER_SITE_OTHER): Payer: BLUE CROSS/BLUE SHIELD | Admitting: Family Medicine

## 2016-09-17 VITALS — BP 120/80 | HR 100 | Temp 98.5°F | Resp 16 | Ht 70.0 in | Wt 262.0 lb

## 2016-09-17 DIAGNOSIS — Z0000 Encounter for general adult medical examination without abnormal findings: Secondary | ICD-10-CM | POA: Diagnosis not present

## 2016-09-17 DIAGNOSIS — M5136 Other intervertebral disc degeneration, lumbar region: Secondary | ICD-10-CM | POA: Diagnosis not present

## 2016-09-17 DIAGNOSIS — G4733 Obstructive sleep apnea (adult) (pediatric): Secondary | ICD-10-CM | POA: Diagnosis not present

## 2016-09-17 DIAGNOSIS — M5030 Other cervical disc degeneration, unspecified cervical region: Secondary | ICD-10-CM

## 2016-09-17 DIAGNOSIS — I119 Hypertensive heart disease without heart failure: Secondary | ICD-10-CM | POA: Diagnosis not present

## 2016-09-17 DIAGNOSIS — Z86010 Personal history of colonic polyps: Secondary | ICD-10-CM

## 2016-09-17 DIAGNOSIS — F329 Major depressive disorder, single episode, unspecified: Secondary | ICD-10-CM

## 2016-09-17 DIAGNOSIS — Z6837 Body mass index (BMI) 37.0-37.9, adult: Secondary | ICD-10-CM

## 2016-09-17 DIAGNOSIS — F32A Depression, unspecified: Secondary | ICD-10-CM

## 2016-09-17 MED ORDER — SERTRALINE HCL 100 MG PO TABS: 50.0000 mg | ORAL_TABLET | Freq: Every evening | ORAL | 4 refills | Status: DC | PRN

## 2016-09-17 NOTE — Patient Instructions (Signed)

## 2016-09-17 NOTE — Progress Notes (Signed)
Patient: Eduardo Poole, Male    DOB: 04/19/1970, 46 y.o.   MRN: [id number] Visit Date: 09/17/2016  Today's Provider: Lelon Huh, MD   Chief Complaint  Patient presents with  . Annual Exam  . Hypertension  . Hyperlipidemia   Subjective:    Annual physical exam Eduardo Poole is a 46 y.o. male who presents today for health maintenance and complete physical. He feels well. He reports exercising yes/some. He reports he is sleeping well.  ----------------------------------------------------------------   Hypertension, follow-up:  BP Readings from Last 3 Encounters:  09/17/16 120/80  04/11/16 127/86  03/04/16 (!) 142/80    He was last seen for hypertension 1 years ago.  BP at that visit was 128/88. Management since that visit includes; no changes.He reports good compliance with treatment. He is not having side effects. none He is exercising. He is not adherent to low salt diet.   Outside blood pressures are 120/80. He is experiencing none.  Patient denies none.   Cardiovascular risk factors include none.  Use of agents associated with hypertension: none.   ----------------------------------------------------------------    Lipid/Cholesterol, Follow-up:   Last seen for this 1 years ago.  Management since that visit includes; no changes.  Last Lipid Panel:    Component Value Date/Time   CHOL 104 02/06/2016 1058   CHOL 120 09/19/2015 1009   TRIG 148 02/06/2016 1058   HDL 27 (L) 02/06/2016 1058   HDL 28 (L) 09/19/2015 1009   CHOLHDL 3.9 02/06/2016 1058   VLDL 30 02/06/2016 1058   LDLCALC 47 02/06/2016 1058   LDLCALC 62 09/19/2015 1009    He reports good compliance with treatment. He is not having side effects. none  Wt Readings from Last 3 Encounters:  09/17/16 262 lb (118.8 kg)  04/11/16 255 lb (115.7 kg)  03/04/16 255 lb (115.7 kg)    ----------------------------------------------------------------  Lab Results  Component Value Date     CHOL 104 02/06/2016   HDL 27 (L) 02/06/2016   LDLCALC 47 02/06/2016   TRIG 148 02/06/2016   CHOLHDL 3.9 02/06/2016   CMP Latest Ref Rng & Units 02/06/2016 11/07/2015 09/19/2015  Glucose 65 - 99 mg/dL 103(H) 111(H) 100(H)  BUN 7 - 25 mg/dL 17 22(H) 19  Creatinine 0.60 - 1.35 mg/dL 0.98 0.96 1.01  Sodium 135 - 146 mmol/L 140 137 140  Potassium 3.5 - 5.3 mmol/L 4.1 4.0 4.8  Chloride 98 - 110 mmol/L 107 106 101  CO2 20 - 31 mmol/L 25 22 23   Calcium 8.6 - 10.3 mg/dL 9.2 9.0 9.4  Total Protein 6.1 - 8.1 g/dL 6.7 7.2 7.0  Total Bilirubin 0.2 - 1.2 mg/dL 0.4 0.6 0.4  Alkaline Phos 40 - 115 U/L 60 52 73  AST 10 - 40 U/L 24 24 29   ALT 9 - 46 U/L 35 35 35     Sleep disorder From 09/19/2015-(saw [facility]) no changes. Using CPAP every night. Works well, feels better as long he wears it.   5. DDD (degenerative disc disease), cervical From 09/19/2015-(saw [facility]) no changes.Recent surgery by Dr. Sharyne Richters. Follows up on Friday 09/22/15.    Review of Systems  Constitutional: Positive for diaphoresis and fatigue.  HENT: Positive for congestion, ear pain, sinus pressure, sneezing and tinnitus.   Eyes: Positive for photophobia, pain, redness and itching.  Respiratory: Positive for apnea, cough and shortness of breath.   Cardiovascular: Negative.   Gastrointestinal: Negative.   Endocrine: Positive for heat intolerance, polydipsia  and polyuria.  Genitourinary: Positive for difficulty urinating and testicular pain.  Musculoskeletal: Positive for arthralgias, back pain, myalgias, neck pain and neck stiffness.  Skin: Negative.   Allergic/Immunologic: Negative.   Neurological: Positive for tremors, speech difficulty, light-headedness, numbness and headaches.  Hematological: Negative.   Psychiatric/Behavioral: Positive for agitation and confusion.    Social History      He  reports that he has never smoked. He has quit using smokeless tobacco. His smokeless tobacco use included Chew. He  reports that he does not drink alcohol or use drugs.       Social History   Social History  . Marital status: Married    Spouse name: N/A  . Number of children: 2  . Years of education: 12   Occupational History  . Employed     Works at [employer]  . Smoking status: Never Smoker  . Smokeless tobacco: Former Systems developer    Types: Chew  . Alcohol use No  . Drug use: No  . Sexual activity: Not Asked   Other Topics Concern  . None   Social History Narrative  . None    Past Medical History:  Diagnosis Date  . CAD (coronary artery disease)    a. 07/2014 Inf STEMI/PTCA:  LM nl, LAD nl, LCX nl, RCA ulcerated plaque (PTCA only);  b. 10/2014 MV: intermediate risk, ? inflat isch/scar;  c. 10/2014 Cath: Nl cors w/o RCA restenosis.  . Diastolic dysfunction    a. 07/2014 Echo: EF 60-65%, Gr1 DD.  . DVT of lower extremity (deep venous thrombosis) ([place])    a. ~ 2009, per pt was on lovenox/coumadin  . Hyperlipidemia   . Hypertension   . Hypertensive heart disease   . TIA (transient ischemic attack) 07/23/2014     Patient Active Problem List   Diagnosis Date Noted  . OSA (obstructive sleep apnea) 01/18/2016  . Hyperlipidemia   . CAD (coronary artery disease)   . Hypertensive heart disease without heart failure   . DDD (degenerative disc disease), cervical 09/19/2015  . DDD (degenerative disc disease), lumbar 09/19/2015  . Hyperlipidemia LDL goal <70 07/15/2015  . Frequent PVCs 12/02/2014  . Abnormal stress test   . Cerebrovascular accident (CVA) ([place]) 10/20/2014  . Temporary cerebral vascular dysfunction 10/04/2014  . History of TIA (transient ischemic attack) 08/04/2014  . Diastolic dysfunction 37/85/8850  . Anxiety 08/02/2014  . Depression 08/02/2014  . Diplopia 08/02/2014  . Fatigue 08/02/2014  . Headache 08/02/2014  . H/O adenomatous polyp of colon 08/02/2014  . History of DVT (deep vein thrombosis) 08/02/2014  . Hypertension 08/02/2014  .  Insomnia 08/02/2014  . Lymphadenopathy 08/02/2014  . Sleep disorder 08/02/2014  . Chest wall pain 08/02/2014  . Pleurisy 08/02/2014  . ST elevation myocardial infarction (STEMI) involving right coronary artery with complication ([place]) 27/74/1287  . ACS (acute coronary syndrome) ([place]) 07/23/2014  . Combined fat and carbohydrate induced hyperlipemia 03/24/2014  . Breathlessness on exertion 03/24/2014  . History of knee surgery 07/18/2011  . Idiopathic localized osteoarthropathy 06/14/2011    Past Surgical History:  Procedure Laterality Date  . ARTHROPLASTY Right 07/09/2010  . CARDIAC CATHETERIZATION N/A 07/23/2014   Procedure: Left Heart Cath;  Surgeon: Troy Sine, MD;  Location: [place] [facility];  Service: Cardiovascular;  Laterality: N/A;  . CARDIAC CATHETERIZATION N/A 07/23/2014   Procedure: Coronary Balloon Angioplasty;  Surgeon: Troy Sine, MD;  Location: [place] [facility];  Service: Cardiovascular;  Laterality: N/A;  . CARDIAC CATHETERIZATION N/A 11/10/2014   Procedure: Left Heart Cath and Coronary Angiography;  Surgeon: Troy Sine, MD;  Location: [place] [facility];  Service: Cardiovascular;  Laterality: N/A;  . JOINT REPLACEMENT    . MENISCUS REPAIR Right 11/03/1995   Meniscus Lateral Tear: repaired by Dr. Sabra Heck at [facility]  . NASAL SINUS SURGERY     Deviated Septum  . REPLACEMENT TOTAL KNEE Right 2011   [facility]    Family History        Family Status  Relation Status  . Mother Alive  . Father Alive  . Sister Alive  . PGF (Not Specified)        His family history includes Arthritis in his mother; Diabetes in his paternal grandfather; Diverticulitis in his mother; Gout in his father; Hypertension in his mother.     No Known Allergies   Current Outpatient Prescriptions:  .  aspirin EC 81 MG EC tablet, Take 1 tablet (81 mg total) by mouth daily. (Patient taking differently: Take 81 mg by mouth at bedtime. ), Disp: , Rfl:  .   atorvastatin (LIPITOR) 80 MG tablet, Take 1 tablet (80 mg total) by mouth daily., Disp: 90 tablet, Rfl: 3 .  clopidogrel (PLAVIX) 75 MG tablet, Take 1 tablet (75 mg total) by mouth daily., Disp: 90 tablet, Rfl: 3 .  metoprolol tartrate (LOPRESSOR) 25 MG tablet, Take 1 tablet (25 mg total) by mouth 2 (two) times daily., Disp: 180 tablet, Rfl: 3 .  nitroGLYCERIN (NITROSTAT) 0.4 MG SL tablet, Place 1 tablet (0.4 mg total) under the tongue every 5 (five) minutes as needed for chest pain., Disp: 25 tablet, Rfl: 12 .  pantoprazole (PROTONIX) 40 MG tablet, Take 1 tablet (40 mg total) by mouth daily., Disp: 90 tablet, Rfl: 3 .  sertraline (ZOLOFT) 100 MG tablet, Take 50 mg by mouth at bedtime as needed., Disp: , Rfl:  .  valsartan (DIOVAN) 80 MG tablet, Take 1 tablet (80 mg total) by mouth daily., Disp: 90 tablet, Rfl: 3   Patient Care Team: Birdie Sons, MD as PCP - General (Family Medicine) Burnis Kingfisher, MD as Referring Physician (Orthopedic Surgery) Beverly Gust, MD (Unknown Physician Specialty)      Objective:   Vitals: BP 120/80 (BP Location: Right Arm, Patient Position: Sitting, Cuff Size: Large)   Pulse 100   Temp 98.5 F (36.9 C) (Oral)   Resp 16   Ht 5\' 10"  (1.778 m)   Wt 262 lb (118.8 kg)   SpO2 93%   BMI 37.59 kg/m    Vitals:   09/17/16 1406  BP: 120/80  Pulse: 100  Resp: 16  Temp: 98.5 F (36.9 C)  TempSrc: Oral  SpO2: 93%  Weight: 262 lb (118.8 kg)  Height: 5\' 10"  (1.778 m)     Physical Exam   General Appearance:    Alert, cooperative, no distress, appears stated age, obese  Head:    Normocephalic, without obvious abnormality, atraumatic  Eyes:    PERRL, conjunctiva/corneas clear, EOM's intact, fundi    benign, both eyes       Ears:    Normal TM's and external ear canals, both ears  Nose:   Nares normal, septum midline, mucosa normal, no drainage   or sinus tenderness  Throat:   Lips, mucosa, and tongue normal; teeth and gums normal  Neck:   Supple,  symmetrical, trachea midline, no adenopathy;       thyroid:  No enlargement/tenderness/nodules; no carotid   bruit or JVD  Back:     Symmetric, no curvature, ROM normal, no CVA tenderness  Lungs:     Clear to auscultation bilaterally, respirations unlabored  Chest wall:    No tenderness or deformity  Heart:    Regular rate and rhythm, S1 and S2 normal, no murmur, rub   or gallop  Abdomen:     Soft, non-tender, bowel sounds active all four quadrants,    no masses, no organomegaly  Genitalia:    deferred  Rectal:    deferred  Extremities:   Extremities normal, atraumatic, no cyanosis or edema  Pulses:   2+ and symmetric all extremities  Skin:   Skin color, texture, turgor normal, no rashes or lesions  Lymph nodes:   Cervical, supraclavicular, and axillary nodes normal  Neurologic:   CNII-XII intact. Normal strength, sensation and reflexes      throughout    Depression Screen PHQ 2/9 Scores 09/17/2016  PHQ - 2 Score 3  PHQ- 9 Score 14      Assessment & Plan:     Routine Health Maintenance and Physical Exam  Exercise Activities and Dietary recommendations Goals    None      Immunization History  Administered Date(s) Administered  . Influenza,inj,Quad PF,36+ Mos 12/02/2014  . Td 06/19/2010    Health Maintenance  Topic Date Due  . HIV Screening  12/19/1984  . INFLUENZA VACCINE  09/18/2016  . TETANUS/TDAP  06/18/2020     Discussed health benefits of physical activity, and encouraged him to engage in regular exercise appropriate for his age and condition.    -------------------------------------------------------------------- 1. Annual physical exam Completed preventive health forms for his insurance. Labs were done in December 2017  2. Hypertensive heart disease without heart failure Well controlled.  Continue current medications.    3. OSA (obstructive sleep apnea) Compliant with CPAP  4. DDD (degenerative disc disease), lumbar Stable, but limiting physical  activity  5. DDD (degenerative disc disease), cervical   6. Depression, unspecified depression type Doing well with sertraline, Continue current medications.    7. H/O adenomatous polyp of colon Colonoscopy 03/2019  8. Obesity Encourage reduced calorie intake for weight loss and exercise as tolerated.      Lelon Huh, MD  [facility]

## 2016-09-18 ENCOUNTER — Encounter: Payer: Self-pay | Admitting: Family Medicine

## 2016-11-14 ENCOUNTER — Encounter: Payer: Self-pay | Admitting: Cardiovascular Disease

## 2016-11-14 ENCOUNTER — Ambulatory Visit (INDEPENDENT_AMBULATORY_CARE_PROVIDER_SITE_OTHER): Payer: BLUE CROSS/BLUE SHIELD | Admitting: Cardiovascular Disease

## 2016-11-14 VITALS — BP 140/89 | HR 69 | Ht 70.0 in | Wt 265.0 lb

## 2016-11-14 DIAGNOSIS — IMO0001 Reserved for inherently not codable concepts without codable children: Secondary | ICD-10-CM

## 2016-11-14 DIAGNOSIS — I10 Essential (primary) hypertension: Secondary | ICD-10-CM | POA: Diagnosis not present

## 2016-11-14 DIAGNOSIS — Z6835 Body mass index (BMI) 35.0-35.9, adult: Secondary | ICD-10-CM

## 2016-11-14 DIAGNOSIS — M1990 Unspecified osteoarthritis, unspecified site: Secondary | ICD-10-CM

## 2016-11-14 DIAGNOSIS — E785 Hyperlipidemia, unspecified: Secondary | ICD-10-CM

## 2016-11-14 DIAGNOSIS — E6609 Other obesity due to excess calories: Secondary | ICD-10-CM | POA: Diagnosis not present

## 2016-11-14 DIAGNOSIS — K219 Gastro-esophageal reflux disease without esophagitis: Secondary | ICD-10-CM | POA: Diagnosis not present

## 2016-11-14 DIAGNOSIS — I2510 Atherosclerotic heart disease of native coronary artery without angina pectoris: Secondary | ICD-10-CM

## 2016-11-14 DIAGNOSIS — Z79899 Other long term (current) drug therapy: Secondary | ICD-10-CM

## 2016-11-14 NOTE — Patient Instructions (Signed)
Medication Instructions:  Your physician recommends that you continue on your current medications as directed. Please refer to the Current Medication list given to you today.  Labwork: Please return for FASTING labs (CMET, CBC, Lipid, TSH)  Our in office lab hours are Monday-Friday 8:00-4:30, closed for lunch 1-2 pm.  No appointment needed.  Follow-Up: Your physician wants you to follow-up in: 71 MONTHS with Dr. Claiborne Billings. You will receive a reminder letter in the mail two months in advance. If you don't receive a letter, please call our office to schedule the follow-up appointment.   Any Other Special Instructions Will Be Listed Below (If Applicable).     If you need a refill on your cardiac medications before your next appointment, please call your pharmacy.

## 2016-11-14 NOTE — Progress Notes (Signed)
Patient ID: Eduardo Poole, male   DOB: October 02, 1970, 46 y.o.   MRN: [id number]     Primary MD: Dr. Lelon Huh Referring M.D.: Dr. Marcello Moores  PATIENT PROFILE: Eduardo Poole is a 46 y.o. male who presented for a 7 month for follow-up cardiology evaluation  HPI:  Eduardo Poole suffered an acute coronary syndrome on June 2016.  At that time.  He initially presented [place] where his ECG showed 2 mm of inferior ST segment elevation and a code STEMI was activated.  He also had transient TIA symptomatology.  Emergent catheterization revealed an ulcerated plaque in the distal RCA which had reperfused on anticoagulation therapy.  There was no residual high-grade stenosis.  PTCA alone was performed and the lesion was not stented.  His other coronary arteries were normal.  Left ventriculography showed normal LV function and a normal appearing aortic anatomy.  He was continued on Angiomax and started on aspirin and Brilinta.    An echo Doppler study showed an EF of 60-65% without wall motion abnormalities.  There was grade 1 diastolic dysfunction. Subsequently, he had noted some occasional PVCs but these have stabilized.  He developed some recurrent chest pain symptomatology in September 2016 which led to a nuclear stress test was wasn't interpreted as intermediate risk and suggesting possible scar/ischemia inferolaterally.  Repeat cardiac catheterization was done in 11/10/2014 and this revealed normal coronary arteries without evidence for restenosis in the distal RCA at the site of the prior ulcerated plaque.    Over the past year, he had continued to be stable.  He denies chest pain, shortness of breath, or palpitations.  He works as an Dispensing optician.  I saw him in May 2017 for preoperative clearance prior to undergoing cervical disc surgery.  He tolerated that well from a cardiovascular standpoint. He has remote history of suffering a DVT approximately 9 years ago and transiently  was treated with Coumadin.  When I saw him for evaluation I was concerned about the possibility of sleep apnea.  He underwent a sleep study which was done at [facility].  He was told of having sleep apnea.  He subsequently was started on CPAP therapy and since initiating therapy.  He feels his sleep is significantly improved.  He is sleeping on average at least 7-1/2-8 hours per night.  He is unaware of breakthrough snoring.  He admits to 100% compliance.  He has a ResMed AirSense 10 unit with heated hose.  He presented to [facility] emergency room in September 2017 with somewhat different chest pain.  At that time, he admitted that he was under significant stress that was work-related.  Troponins and CT scan of his chest were negative.  He has since changed jobs.  He does the same type of work but works at home.  He tries to be active and intermittently walks on a treadmill and does yard work.  He has been bothered by DJD of his thoracic back.  He continues to use CPAP with 100% compliance and feels significantly better.  He is on CPAP therapy at 13 cm water pressure continues to use this with 100% compliance.  Since I last saw him, he denies any recurrent episodes of chest pain, palpitations or shortness of breath.  He does not routinely exercise.  His job is sedentary.  He is exercise has been limited by DJD and arthritis.  He continues to use CPAP.  His last laboratory was checked in December 2017 and  at that time his LDL cholesterol was 47.   Past Medical History:  Diagnosis Date  . CAD (coronary artery disease)    a. 07/2014 Inf STEMI/PTCA:  LM nl, LAD nl, LCX nl, RCA ulcerated plaque (PTCA only);  b. 10/2014 MV: intermediate risk, ? inflat isch/scar;  c. 10/2014 Cath: Nl cors w/o RCA restenosis.  . Diastolic dysfunction    a. 07/2014 Echo: EF 60-65%, Gr1 DD.  . DVT of lower extremity (deep venous thrombosis) ([place])    a. ~ 2009, per pt was on lovenox/coumadin  . Hyperlipidemia    . Hypertension   . Hypertensive heart disease   . TIA (transient ischemic attack) 07/23/2014    Past Surgical History:  Procedure Laterality Date  . ARTHROPLASTY Right 07/09/2010  . CARDIAC CATHETERIZATION N/A 07/23/2014   Procedure: Left Heart Cath;  Surgeon: Troy Sine, MD;  Location: [place] [facility];  Service: Cardiovascular;  Laterality: N/A;  . CARDIAC CATHETERIZATION N/A 07/23/2014   Procedure: Coronary Balloon Angioplasty;  Surgeon: Troy Sine, MD;  Location: [place] [facility];  Service: Cardiovascular;  Laterality: N/A;  . CARDIAC CATHETERIZATION N/A 11/10/2014   Procedure: Left Heart Cath and Coronary Angiography;  Surgeon: Troy Sine, MD;  Location: [place] [facility];  Service: Cardiovascular;  Laterality: N/A;  . JOINT REPLACEMENT    . MENISCUS REPAIR Right 11/03/1995   Meniscus Lateral Tear: repaired by Dr. Sabra Heck [facility]  . NASAL SINUS SURGERY     Deviated Septum  . REPLACEMENT TOTAL KNEE Right 2011   [facility]    No Known Allergies  Current Outpatient Prescriptions  Medication Sig Dispense Refill  . aspirin EC 81 MG EC tablet Take 1 tablet (81 mg total) by mouth daily. (Patient taking differently: Take 81 mg by mouth at bedtime. )    . atorvastatin (LIPITOR) 80 MG tablet Take 1 tablet (80 mg total) by mouth daily. 90 tablet 3  . clopidogrel (PLAVIX) 75 MG tablet Take 1 tablet (75 mg total) by mouth daily. 90 tablet 3  . metoprolol tartrate (LOPRESSOR) 25 MG tablet Take 1 tablet (25 mg total) by mouth 2 (two) times daily. 180 tablet 3  . nitroGLYCERIN (NITROSTAT) 0.4 MG SL tablet Place 1 tablet (0.4 mg total) under the tongue every 5 (five) minutes as needed for chest pain. 25 tablet 12  . pantoprazole (PROTONIX) 40 MG tablet Take 1 tablet (40 mg total) by mouth daily. 90 tablet 3  . sertraline (ZOLOFT) 100 MG tablet Take 0.5 tablets (50 mg total) by mouth at bedtime as needed. 90 tablet 4  . valsartan (DIOVAN) 80 MG tablet  Take 1 tablet (80 mg total) by mouth daily. 90 tablet 3   No current facility-administered medications for this visit.     Social History   Social History  . Marital status: Married    Spouse name: N/A  . Number of children: 2  . Years of education: 12   Occupational History  . Employed     Works at [employer]  . Smoking status: Never Smoker  . Smokeless tobacco: Former Systems developer    Types: Chew  . Alcohol use No  . Drug use: No  . Sexual activity: Not on file   Other Topics Concern  . Not on file   Social History Narrative  . No narrative on file    Family History  Problem Relation Age of Onset  . Hypertension  Mother   . Arthritis Mother   . Diverticulitis Mother   . Gout Father   . Diabetes Paternal Grandfather     ROS General: Negative; No fevers, chills, or night sweats HEENT: Negative; No changes in vision or hearing, sinus congestion, difficulty swallowing Pulmonary: Negative; No cough, wheezing, shortness of breath, hemoptysis Cardiovascular:  See HPI;  GI: Negative; No nausea, vomiting, diarrhea, or abdominal pain GU: Negative; No dysuria, hematuria, or difficulty voiding Musculoskeletal: Positive for cervical disc disease; history of right knee replacement  Hematologic/Oncologic: Negative; no easy bruising, bleeding Endocrine: Negative; no heat/cold intolerance; no diabetes Neuro: Negative; no changes in balance, headaches Skin: Negative; No rashes or skin lesions Psychiatric: Negative; No behavioral problems, depression Sleep: Negative; No daytime sleepiness, hypersomnolence, bruxism, restless legs, hypnogagnic hallucinations Other comprehensive 14 point system review is negative   Physical Exam BP 140/89   Pulse 69   Ht 5' 10"  (1.778 m)   Wt 265 lb (120.2 kg)   BMI 38.02 kg/m    Repeat blood pressure by me 126/82  Wt Readings from Last 3 Encounters:  11/14/16 265 lb (120.2 kg)  09/17/16 262 lb (118.8 kg)    04/11/16 255 lb (115.7 kg)   General: Alert, oriented, no distress.  Skin: normal turgor, no rashes, warm and dry HEENT: Normocephalic, atraumatic. Pupils equal round and reactive to light; sclera anicteric; extraocular muscles intact;  Nose without nasal septal hypertrophy Mouth/Parynx benign; Mallinpatti scale 3 Neck: Thick neck; No JVD, no carotid bruits; normal carotid upstroke Lungs: clear to ausculatation and percussion; no wheezing or rales Chest wall: without tenderness to palpitation Heart: PMI not displaced, RRR, s1 s2 normal, 1/6 systolic murmur, no diastolic murmur, no rubs, gallops, thrills, or heaves Abdomen: Central adiposity; soft, nontender; no hepatosplenomehaly, BS+; abdominal aorta nontender and not dilated by palpation. Back: no CVA tenderness Pulses 2+ Musculoskeletal: full range of motion, normal strength, no joint deformities Extremities: no clubbing cyanosis or edema, Homan's sign negative  Neurologic: grossly nonfocal; Cranial nerves grossly wnl Psychologic: Normal mood and affect; normal cognition   ECG (independently read by me): Normal sinus rhythm at 69 bpm.  Incomplete right bundle branch block.  Normal intervals.  February 2018 ECG (independently read by me): Normal sinus rhythm at 73 bpm.  Normal EKG.  Normal intervals.  November 2017 ECG (independently read by me): Sinus rhythm at 78 bpm.  Mild RV conduction delay.  Intervals normal.  May 2017 ECG (independently read by me): ECG from 07/12/2015.  As part of his preoperative screening showed normal sinus rhythm at 71 bpm.  There was incomplete right bundle branch block.  Left axis deviation.  LABS:  BMP Latest Ref Rng & Units 02/06/2016 11/07/2015 09/19/2015  Glucose 65 - 99 mg/dL 103(H) 111(H) 100(H)  BUN 7 - 25 mg/dL 17 22(H) 19  Creatinine 0.60 - 1.35 mg/dL 0.98 0.96 1.01  BUN/Creat Ratio 9 - 20 - - 19  Sodium 135 - 146 mmol/L 140 137 140  Potassium 3.5 - 5.3 mmol/L 4.1 4.0 4.8  Chloride 98 -  110 mmol/L 107 106 101  CO2 20 - 31 mmol/L 25 22 23   Calcium 8.6 - 10.3 mg/dL 9.2 9.0 9.4     Hepatic Function Latest Ref Rng & Units 02/06/2016 11/07/2015 09/19/2015  Total Protein 6.1 - 8.1 g/dL 6.7 7.2 7.0  Albumin 3.6 - 5.1 g/dL 4.1 4.1 4.2  AST 10 - 40 U/L 24 24 29   ALT 9 - 46 U/L 35 35 35  Alk Phosphatase  40 - 115 U/L 60 52 73  Total Bilirubin 0.2 - 1.2 mg/dL 0.4 0.6 0.4  Bilirubin, Direct 0.1 - 0.5 mg/dL - <0.1(L) -    CBC Latest Ref Rng & Units 11/07/2015 09/19/2015 11/09/2014  WBC 3.8 - 10.6 K/uL 11.8(H) 6.7 7.9  Hemoglobin 13.0 - 18.0 g/dL 14.9 14.1 14.3  Hematocrit 40.0 - 52.0 % 43.5 41.8 41.8  Platelets 150 - 440 K/uL 305 300 290   Lab Results  Component Value Date   MCV 88.2 11/07/2015   MCV 89 09/19/2015   MCV 90 11/09/2014   Lab Results  Component Value Date   TSH 2.900 10/18/2014   Lab Results  Component Value Date   HGBA1C 5.8 (H) 07/24/2014     BNP No results found for: BNP  ProBNP No results found for: PROBNP   Lipid Panel     Component Value Date/Time   CHOL 104 02/06/2016 1058   CHOL 120 09/19/2015 1009   TRIG 148 02/06/2016 1058   HDL 27 (L) 02/06/2016 1058   HDL 28 (L) 09/19/2015 1009   CHOLHDL 3.9 02/06/2016 1058   VLDL 30 02/06/2016 1058   LDLCALC 47 02/06/2016 1058   LDLCALC 62 09/19/2015 1009    RADIOLOGY: No results found.  IMPRESSION:  1. Coronary artery disease involving native coronary artery of native heart without angina pectoris   2. Essential hypertension   3. Hyperlipidemia LDL goal <70   4. Medication management   5. Class 2 obesity due to excess calories with serious comorbidity and body mass index (BMI) of 35.0 to 35.9 in adult   6. Gastroesophageal reflux disease, esophagitis presence not specified   7. Osteoarthritis, unspecified osteoarthritis type, unspecified site     ASSESSMENT AND PLAN: Mr. Antoin Poole is a 46 year old gentleman who suffered a remote DVT approximately 10 years ago and was on  anticoagulation therapy short-term.  He developed an acute coronary syndrome on 07/23/2014 and was found to have an ulcerated plaque but without significant residual stenosis following initial anticoagulation therapy.  This was successfully treated with PTCA, but was not stented.  Repeat catheterization for  atypical chest pain after nuclear study suggested possible inferolateral ischemia revealed entirely normal coronary arteries.  He was initially treated with aspirin and Brilinta.  After one year, he was switched to Plavix.  He underwent successful neck surgery without cardiovascular compromise.  His blood pressure today is stable on recheck by me on metoprolol, tartrate 25 mm twice a day and valsartan 80 mg.  He initially had a tainted valsartan and this was exchanged for a different generic version from his pharmacy, which was not tainted.  He continues to use CPAP with 100% compliance.  He denies breakthrough snoring.  He denies frequent awakenings.  His sleep is restorative.  He continues to be on dual antiplatelet therapy with his ACS history.  He is tolerating Plavix without bleeding.  He has had musculoskeletal issues with DJD and arthritis which has limited his exercise.  I have suggested that perhaps she consider swimming or walking in a pool for aerobic conditioning, which will be better with his orthopedic complaints.  His weight has increased up to 265 pounds.  He is moderately obese with a BMI of 38.  Weight loss was recommended.  GERD is controlled with Protonix.  I will repeat laboratory in a fasting state.  Adjustments to his medicines will be made if necessary.  As long as he remains stable, I will see him in one year  for reevaluation.  Time spent: 25 minutes  Troy Sine, MD, [facility] 11/14/2016 5:10 PM

## 2016-11-20 DIAGNOSIS — Z79899 Other long term (current) drug therapy: Secondary | ICD-10-CM | POA: Diagnosis not present

## 2016-11-20 DIAGNOSIS — E785 Hyperlipidemia, unspecified: Secondary | ICD-10-CM | POA: Diagnosis not present

## 2016-11-20 DIAGNOSIS — I10 Essential (primary) hypertension: Secondary | ICD-10-CM | POA: Diagnosis not present

## 2016-11-20 DIAGNOSIS — I2510 Atherosclerotic heart disease of native coronary artery without angina pectoris: Secondary | ICD-10-CM | POA: Diagnosis not present

## 2016-11-21 LAB — LIPID PANEL
CHOL/HDL RATIO: 3.6 ratio (ref 0.0–5.0)
Cholesterol, Total: 108 mg/dL (ref 100–199)
HDL: 30 mg/dL — AB (ref >39)
LDL CALC: 53 mg/dL (ref 0–99)
TRIGLYCERIDES: 127 mg/dL (ref 0–149)
VLDL Cholesterol Cal: 25 mg/dL (ref 5–40)

## 2016-11-21 LAB — COMPREHENSIVE METABOLIC PANEL
ALT: 42 IU/L (ref 0–44)
AST: 26 IU/L (ref 0–40)
Albumin/Globulin Ratio: 1.7 (ref 1.2–2.2)
Albumin: 4.3 g/dL (ref 3.5–5.5)
Alkaline Phosphatase: 72 IU/L (ref 39–117)
BUN/Creatinine Ratio: 20 (ref 9–20)
BUN: 20 mg/dL (ref 6–24)
Bilirubin Total: 0.5 mg/dL (ref 0.0–1.2)
CALCIUM: 9.9 mg/dL (ref 8.7–10.2)
CO2: 24 mmol/L (ref 20–29)
CREATININE: 0.98 mg/dL (ref 0.76–1.27)
Chloride: 104 mmol/L (ref 96–106)
GFR, EST AFRICAN AMERICAN: 106 mL/min/{1.73_m2} (ref >59)
GFR, EST NON AFRICAN AMERICAN: 92 mL/min/{1.73_m2} (ref >59)
GLOBULIN, TOTAL: 2.5 g/dL (ref 1.5–4.5)
Glucose: 103 mg/dL — ABNORMAL HIGH (ref 65–99)
Potassium: 4.8 mmol/L (ref 3.5–5.2)
Sodium: 140 mmol/L (ref 134–144)
TOTAL PROTEIN: 6.8 g/dL (ref 6.0–8.5)

## 2016-11-21 LAB — CBC
HEMATOCRIT: 41.5 % (ref 37.5–51.0)
Hemoglobin: 14.5 g/dL (ref 13.0–17.7)
MCH: 30.9 pg (ref 26.6–33.0)
MCHC: 34.9 g/dL (ref 31.5–35.7)
MCV: 88 fL (ref 79–97)
PLATELETS: 299 10*3/uL (ref 150–379)
RBC: 4.7 x10E6/uL (ref 4.14–5.80)
RDW: 13.2 % (ref 12.3–15.4)
WBC: 7.9 10*3/uL (ref 3.4–10.8)

## 2016-11-21 LAB — TSH: TSH: 3.49 u[IU]/mL (ref 0.450–4.500)

## 2016-11-26 ENCOUNTER — Encounter: Payer: Self-pay | Admitting: Family Medicine

## 2016-11-26 ENCOUNTER — Ambulatory Visit (INDEPENDENT_AMBULATORY_CARE_PROVIDER_SITE_OTHER): Payer: BLUE CROSS/BLUE SHIELD | Admitting: Family Medicine

## 2016-11-26 VITALS — BP 146/88 | HR 79 | Temp 98.6°F | Resp 16 | Wt 269.0 lb

## 2016-11-26 DIAGNOSIS — M545 Low back pain: Secondary | ICD-10-CM

## 2016-11-26 DIAGNOSIS — J329 Chronic sinusitis, unspecified: Secondary | ICD-10-CM | POA: Diagnosis not present

## 2016-11-26 LAB — POCT URINALYSIS DIPSTICK
GLUCOSE UA: NEGATIVE
KETONES UA: NEGATIVE
LEUKOCYTES UA: NEGATIVE
NITRITE UA: NEGATIVE
PH UA: 6 (ref 5.0–8.0)
Protein, UA: NEGATIVE
Spec Grav, UA: 1.025 (ref 1.010–1.025)
Urobilinogen, UA: 0.2 E.U./dL

## 2016-11-26 MED ORDER — AMOXICILLIN 500 MG PO CAPS: 1000.0000 mg | ORAL_CAPSULE | Freq: Two times a day (BID) | ORAL | 0 refills | Status: AC

## 2016-11-26 NOTE — Progress Notes (Signed)
Patient: Eduardo Poole Male    DOB: August 03, 1970   46 y.o.   MRN: [id number] Visit Date: 11/26/2016  Today's Provider: Lelon Huh, MD   Chief Complaint  Patient presents with  . Sinusitis   Subjective:    Sinusitis  This is a new problem. Episode onset: 1 week ago. The problem has been gradually worsening since onset. There has been no fever. Associated symptoms include congestion (sinus congestion), coughing (productive with yellow phlegm), diaphoresis, ear pain, headaches, a hoarse voice, neck pain, sinus pressure, sneezing, a sore throat and swollen glands. Pertinent negatives include no chills or shortness of breath. Past treatments include saline sprays (also OTC Sinus medication). The treatment provided no relief.  Symptoms are worse in the mornings.  He also reports aching in both sides of mid and lower back for about a week and a half. No particular injury. No radiation.     No Known Allergies   Current Outpatient Prescriptions:  .  aspirin EC 81 MG EC tablet, Take 1 tablet (81 mg total) by mouth daily. (Patient taking differently: Take 81 mg by mouth at bedtime. ), Disp: , Rfl:  .  atorvastatin (LIPITOR) 80 MG tablet, Take 1 tablet (80 mg total) by mouth daily., Disp: 90 tablet, Rfl: 3 .  clopidogrel (PLAVIX) 75 MG tablet, Take 1 tablet (75 mg total) by mouth daily., Disp: 90 tablet, Rfl: 3 .  metoprolol tartrate (LOPRESSOR) 25 MG tablet, Take 1 tablet (25 mg total) by mouth 2 (two) times daily., Disp: 180 tablet, Rfl: 3 .  nitroGLYCERIN (NITROSTAT) 0.4 MG SL tablet, Place 1 tablet (0.4 mg total) under the tongue every 5 (five) minutes as needed for chest pain., Disp: 25 tablet, Rfl: 12 .  pantoprazole (PROTONIX) 40 MG tablet, Take 1 tablet (40 mg total) by mouth daily., Disp: 90 tablet, Rfl: 3 .  sertraline (ZOLOFT) 100 MG tablet, Take 0.5 tablets (50 mg total) by mouth at bedtime as needed., Disp: 90 tablet, Rfl: 4 .  valsartan (DIOVAN) 80 MG tablet, Take 1 tablet  (80 mg total) by mouth daily., Disp: 90 tablet, Rfl: 3  Review of Systems  Constitutional: Positive for diaphoresis and fatigue. Negative for appetite change, chills and fever.  HENT: Positive for congestion (sinus congestion), ear pain, hoarse voice, postnasal drip, rhinorrhea, sinus pain, sinus pressure, sneezing and sore throat.   Eyes: Positive for discharge and itching.  Respiratory: Positive for cough (productive with yellow phlegm). Negative for chest tightness, shortness of breath and wheezing.   Cardiovascular: Negative for chest pain and palpitations.  Gastrointestinal: Negative for abdominal pain, nausea and vomiting.  Musculoskeletal: Positive for neck pain.  Neurological: Positive for headaches.    Social History  Substance Use Topics  . Smoking status: Never Smoker  . Smokeless tobacco: Former Systems developer    Types: Chew  . Alcohol use No   Objective:   BP (!) 146/88 (BP Location: Right Arm, Patient Position: Sitting, Cuff Size: Large)   Pulse 79   Temp 98.6 F (37 C) (Oral)   Resp 16   Wt 269 lb (122 kg)   SpO2 96% Comment: room air  BMI 38.60 kg/m  There were no vitals filed for this visit.   Physical Exam  General Appearance:    Alert, cooperative, no distress  HENT:   bilateral TM normal without fluid or infection, throat normal without erythema or exudate, left maxillary sinuses tender and nasal mucosa pale and congested  Eyes:  PERRL, conjunctiva/corneas clear, EOM's intact       Lungs:     Clear to auscultation bilaterally, respirations unlabored  Heart:    Regular rate and rhythm  Neurologic:   Awake, alert, oriented x 3. No apparent focal neurological           defect.   MS:   Mild tenderness bilateral paravertebral muscles.    Results for orders placed or performed in visit on 11/26/16  POCT Urinalysis Dipstick  Result Value Ref Range   Color, UA dark yellow    Clarity, UA clear    Glucose, UA negative    Bilirubin, UA small    Ketones, UA negative     Spec Grav, UA 1.025 1.010 - 1.025   Blood, UA small (hemolyzed)    pH, UA 6.0 5.0 - 8.0   Protein, UA negative    Urobilinogen, UA 0.2 0.2 or 1.0 E.U./dL   Nitrite, UA negative    Leukocytes, UA Negative Negative       Assessment & Plan:     1. Sinusitis, unspecified chronicity, unspecified location  - amoxicillin (AMOXIL) 500 MG capsule; Take 2 capsules (1,000 mg total) by mouth 2 (two) times daily.  Dispense: 40 capsule; Refill: 0  2. Acute low back pain, unspecified back pain laterality, with sciatica presence unspecified Likely mild muscle strain. Alternate heat/ice. OTC acetaminophen prh. Call if lasts >4 weeks.  - POCT Urinalysis Dipstick       Lelon Huh, MD  [facility]

## 2016-12-31 ENCOUNTER — Telehealth: Payer: Self-pay | Admitting: *Deleted

## 2016-12-31 NOTE — Telephone Encounter (Signed)
Patient informed. 

## 2016-12-31 NOTE — Telephone Encounter (Signed)
-----   Message from Troy Sine, MD sent at 12/30/2016  9:46 AM EST ----- Lab stable

## 2017-02-01 IMAGING — MR MR HEAD W/O CM
9 of 10 series · 40 of 48 positions shown · non-contrast
Comparison: None.

CLINICAL DATA: Transient cerebral ischemia, tremor, and slurred
speech beginning after a myocardial infarction earlier this year.

EXAM:
MRI HEAD WITHOUT CONTRAST
TECHNIQUE: Multiplanar, multiecho pulse sequences of the brain and surrounding
structures were obtained without intravenous contrast.

[Series 2: T1 · sagittal · 5.0mm · 0.45mm/px · 5 of 29 slices shown]
[im 1/29]
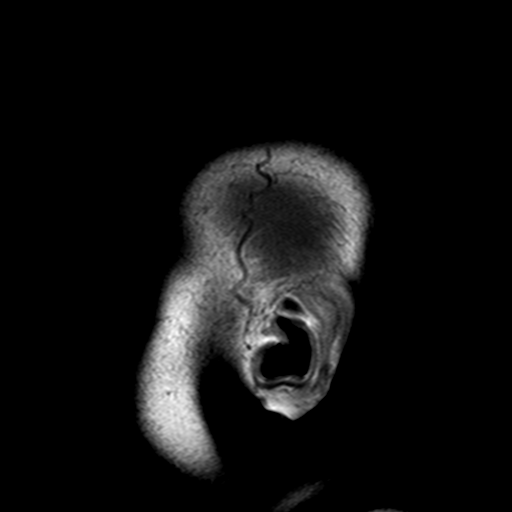
[im 8/29]
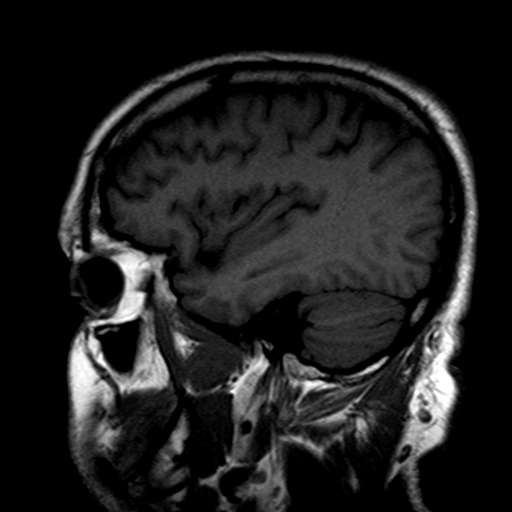
[im 15/29]
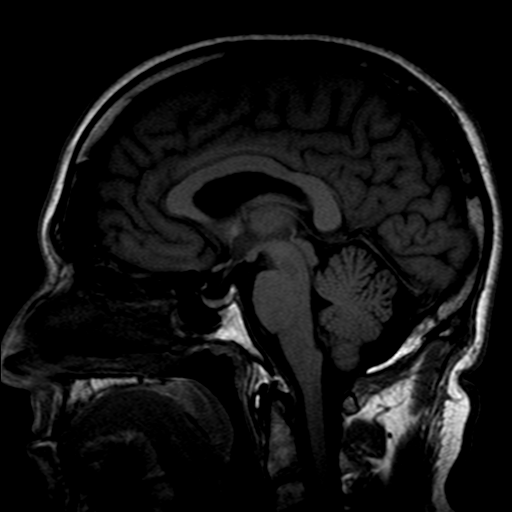
[im 22/29]
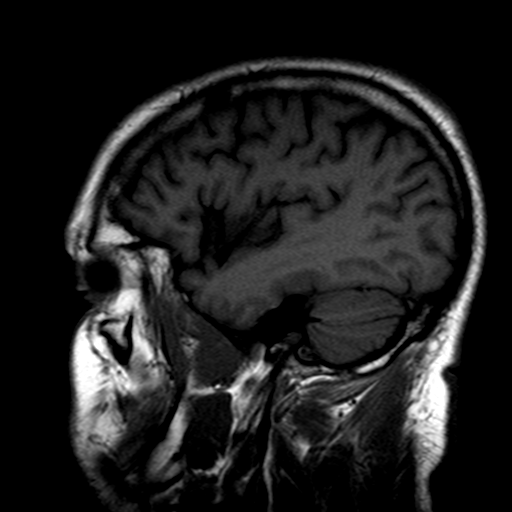
[im 29/29]
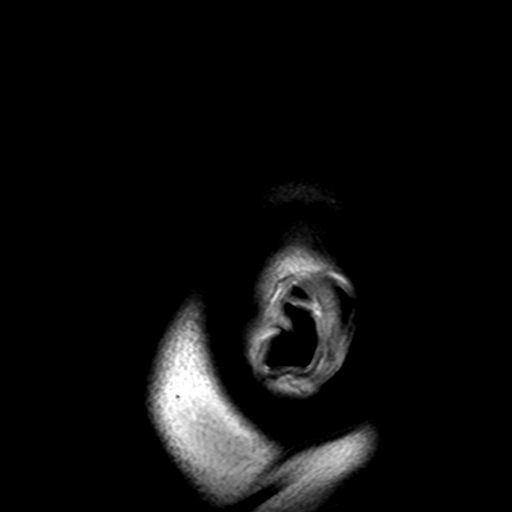

[Series 4: DWI · axial · 4.0mm · 0.94mm/px · z∈[-49,+125]mm · 7 of 45 slices shown (1 of 4)]
[im 1/45]
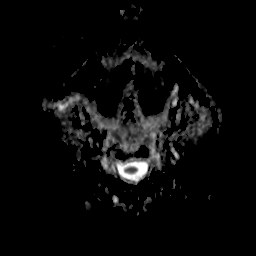
[im 8/45]
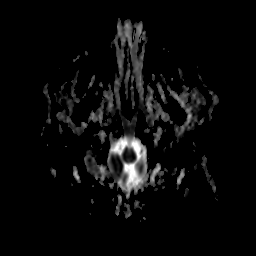
[im 15/45]
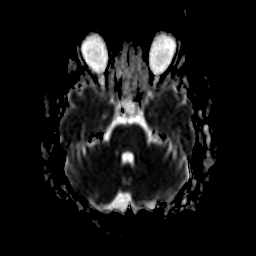
[im 23/45]
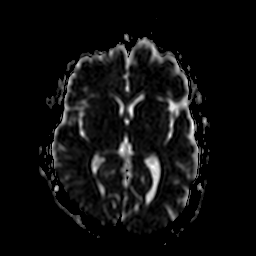
[im 30/45]
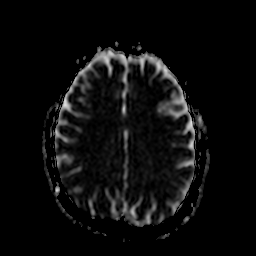
[im 37/45]
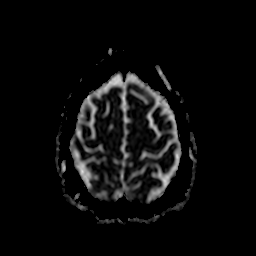
[im 45/45]
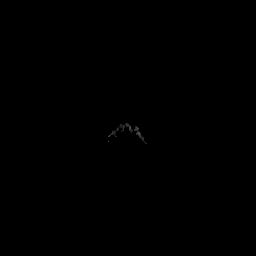

[Series 6: DWI · coronal · 5.0mm · 1.80mm/px · 5 of 42 slices shown (2 of 4)]
[im 1/42]
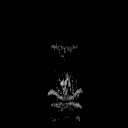
[im 11/42]
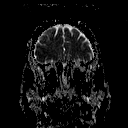
[im 21/42]
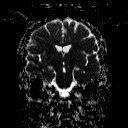
[im 31/42]
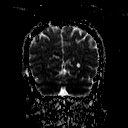
[im 42/42]
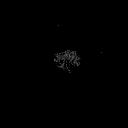

[Series 7: DWI · axial · 4.0mm · 0.94mm/px · z∈[-49,+121]mm · 5 of 43 slices shown (3 of 4)]
[im 1/43]
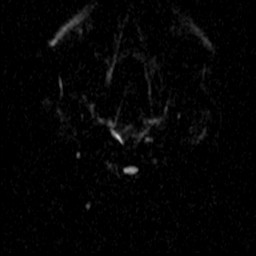
[im 11/43]
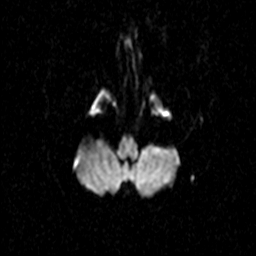
[im 22/43]
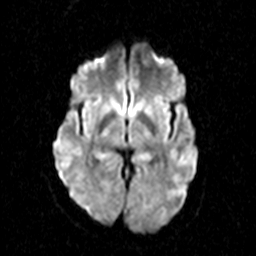
[im 32/43]
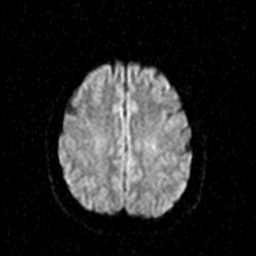
[im 43/43]
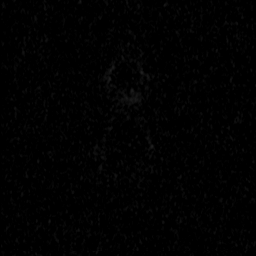

[Series 8: DWI · coronal · 5.0mm · 1.80mm/px · 5 of 41 slices shown (4 of 4)]
[im 1/41]
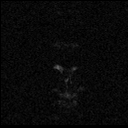
[im 11/41]
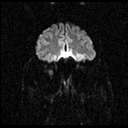
[im 21/41]
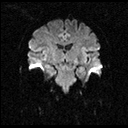
[im 31/41]
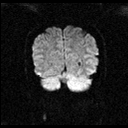
[im 41/41]
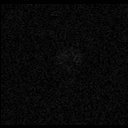

[Series 9: T2 · axial · 5.0mm · 0.45mm/px · z∈[-45,+122]mm · 3 of 27 slices shown (1 of 3)]
[im 1/27]
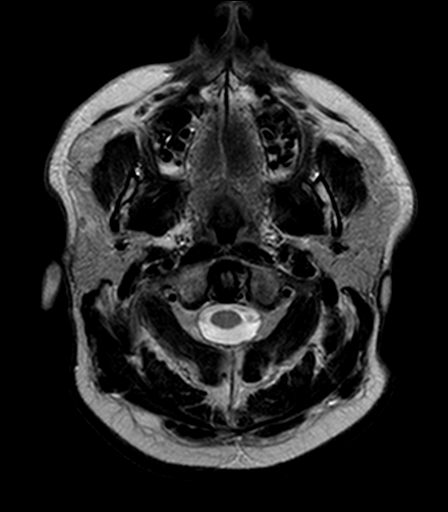
[im 14/27]
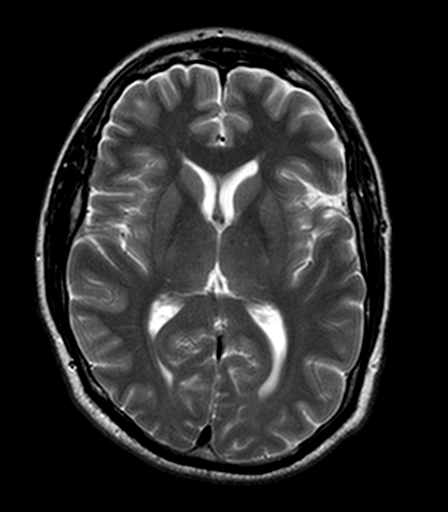
[im 27/27]
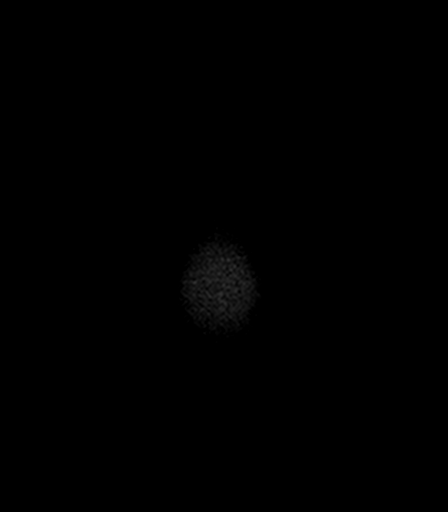

[Series 10: FLAIR · axial · 5.0mm · 0.90mm/px · z∈[-45,+122]mm · 3 of 27 slices shown]
[im 1/27]
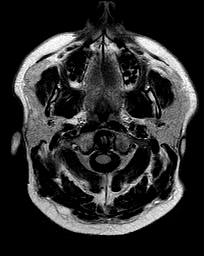
[im 14/27]
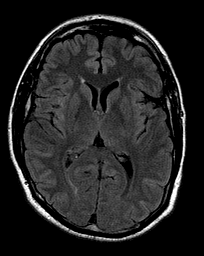
[im 27/27]
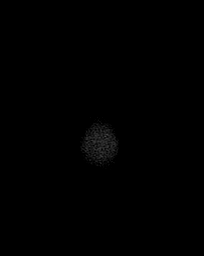

[Series 11: T2 · axial · 5.0mm · 0.45mm/px · z∈[-45,+122]mm · 3 of 27 slices shown (2 of 3)]
[im 1/27]
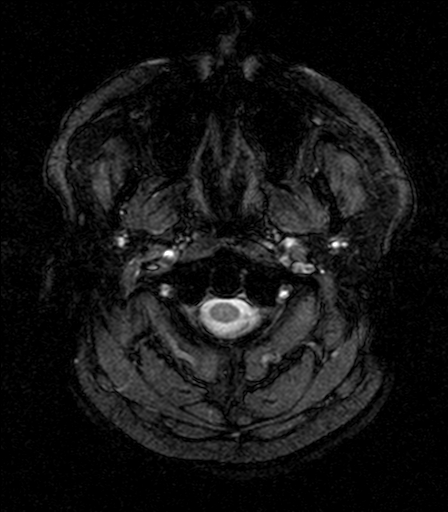
[im 14/27]
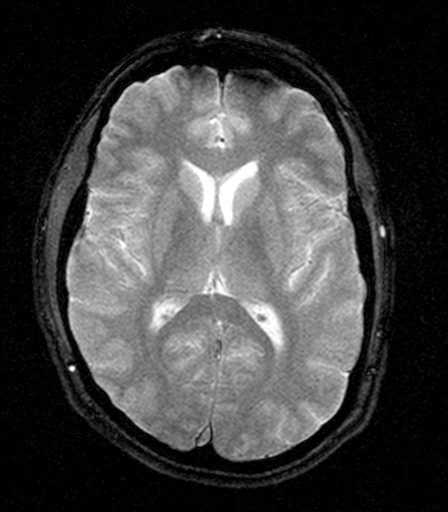
[im 27/27]
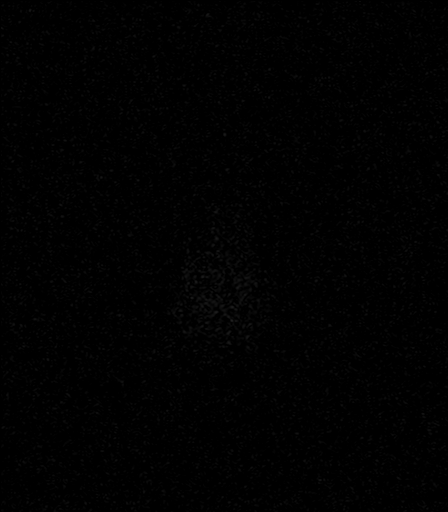

[Series 13: T2 · coronal · 5.0mm · 0.45mm/px · 4 of 33 slices shown (3 of 3)]
[im 1/33]
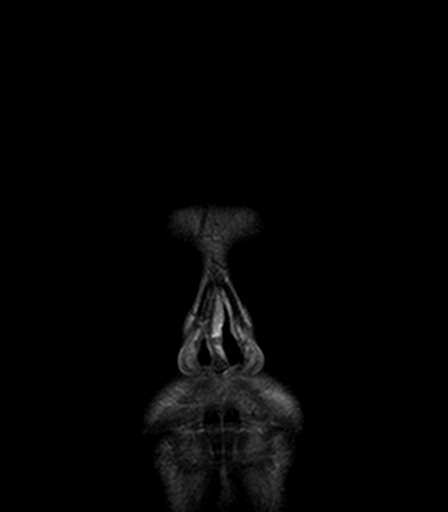
[im 11/33]
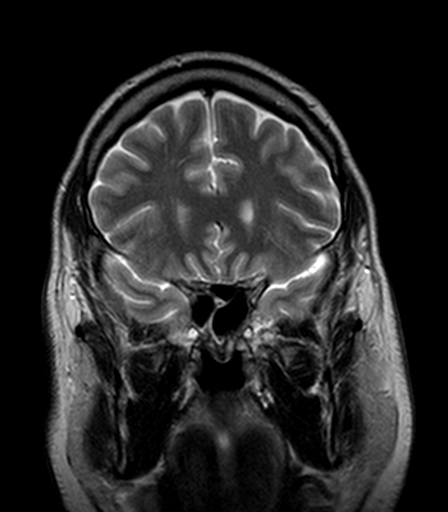
[im 22/33]
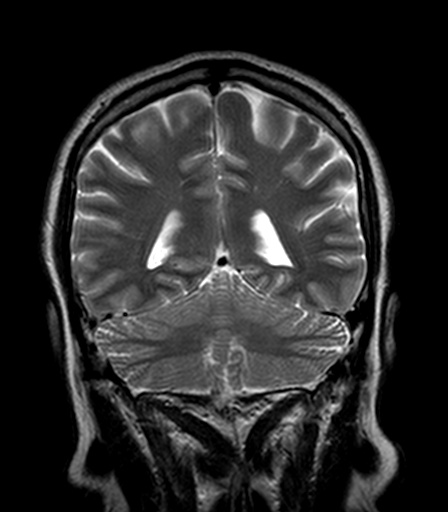
[im 33/33]
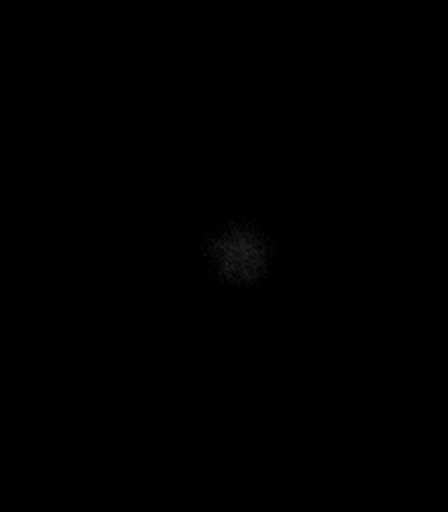

[40 of 48 positions shown; findings below may reference images not displayed]

FINDINGS: There is a linear focus of restricted diffusion in the LEFT
posterior frontal cortex and subcortical white matter, 3 x 3 mm
cross-section extending over a depth of 8 mm, consistent with an
acute or subacute infarct. No other similar lesions. Within limits
for detection on MR, no visible hemorrhage. No mass lesion,
hydrocephalus, or extra-axial fluid.

Normal for age cerebral volume. No significant white matter disease.
No foci of chronic hemorrhage on gradient sequence.

Flow voids are maintained. No midline abnormalities. No osseous
findings. Extracranial soft tissues unremarkable.
IMPRESSION: Small 3 x 3 x 8 mm focus of restricted diffusion representing acute
or subacute infarction in the LEFT posterior frontal cortex and
subcortical white matter. No other similar lesions.

No proximal flow reducing lesion as seen on this routine brain MR.

Findings discussed with ordering provider.

## 2017-03-13 ENCOUNTER — Other Ambulatory Visit: Payer: Self-pay | Admitting: Family Medicine

## 2017-03-13 MED ORDER — SERTRALINE HCL 100 MG PO TABS: 50.0000 mg | ORAL_TABLET | Freq: Every evening | ORAL | 4 refills | Status: DC | PRN

## 2017-03-13 NOTE — Telephone Encounter (Signed)
[facility] pharmacy faxed a refill request for a 90-days supply for the following medication. Thanks CC ° °sertraline (ZOLOFT) 100 MG tablet  ° °

## 2017-03-13 NOTE — Telephone Encounter (Signed)
Requesting 90 day supply.

## 2017-04-09 DIAGNOSIS — E669 Obesity, unspecified: Secondary | ICD-10-CM | POA: Diagnosis not present

## 2017-04-09 DIAGNOSIS — IMO0001 Reserved for inherently not codable concepts without codable children: Secondary | ICD-10-CM | POA: Insufficient documentation

## 2017-04-09 DIAGNOSIS — M25562 Pain in left knee: Secondary | ICD-10-CM | POA: Diagnosis not present

## 2017-05-22 ENCOUNTER — Other Ambulatory Visit: Payer: Self-pay | Admitting: Unknown Physician Specialty

## 2017-05-22 DIAGNOSIS — M1712 Unilateral primary osteoarthritis, left knee: Secondary | ICD-10-CM

## 2017-06-02 ENCOUNTER — Ambulatory Visit: Payer: BLUE CROSS/BLUE SHIELD

## 2017-07-11 ENCOUNTER — Other Ambulatory Visit: Payer: Self-pay | Admitting: Cardiovascular Disease

## 2017-07-11 NOTE — Telephone Encounter (Signed)
Rx request sent to pharmacy.  

## 2017-07-18 ENCOUNTER — Other Ambulatory Visit: Payer: Self-pay | Admitting: Cardiovascular Disease

## 2017-07-18 NOTE — Telephone Encounter (Signed)
Rx sent to pharmacy   

## 2017-07-31 ENCOUNTER — Ambulatory Visit (INDEPENDENT_AMBULATORY_CARE_PROVIDER_SITE_OTHER): Payer: BLUE CROSS/BLUE SHIELD | Admitting: Physician Assistant

## 2017-07-31 ENCOUNTER — Telehealth: Payer: Self-pay | Admitting: *Deleted

## 2017-07-31 ENCOUNTER — Encounter: Payer: Self-pay | Admitting: Physician Assistant

## 2017-07-31 ENCOUNTER — Ambulatory Visit
Admission: RE | Admit: 2017-07-31 | Discharge: 2017-07-31 | Disposition: A | Discharge status: Home / Self Care | Payer: BLUE CROSS/BLUE SHIELD | Source: Ambulatory Visit | Attending: Physician Assistant | Admitting: Physician Assistant

## 2017-07-31 VITALS — BP 160/100 | HR 85 | Temp 98.3°F | Resp 16 | Ht 70.0 in | Wt 276.2 lb

## 2017-07-31 DIAGNOSIS — M79662 Pain in left lower leg: Secondary | ICD-10-CM

## 2017-07-31 DIAGNOSIS — Z86718 Personal history of other venous thrombosis and embolism: Secondary | ICD-10-CM | POA: Diagnosis not present

## 2017-07-31 DIAGNOSIS — I82442 Acute embolism and thrombosis of left tibial vein: Secondary | ICD-10-CM | POA: Diagnosis not present

## 2017-07-31 DIAGNOSIS — M7989 Other specified soft tissue disorders: Secondary | ICD-10-CM

## 2017-07-31 NOTE — Telephone Encounter (Signed)
Patient called office concerning left calf pain. Patient stated his left calf has been hurting for 2 days. Patient has had a blood clot in right leg a few years ago and the pain in left now is about the same. Per Dr. Caryn Section, advised pt to come in for ov this afternoon. Scheduled pt with Jennie at 2:40 pm.

## 2017-07-31 NOTE — Progress Notes (Addendum)
Patient: Eduardo Poole Male    DOB: Oct 16, 1970   47 y.o.   MRN: [id number] Visit Date: 07/31/2017  Today's Provider: Mar Daring, PA-C   Chief Complaint  Patient presents with  . Leg Pain   Subjective:    HPI Patient here today with c/o left calf pain (possible clot). Patient states it has been hurting for 2 days. Patient has had a blood clot in right leg a few years ago and the pain in left now is about the same. Reports that he sits most of the day at work. Reports the pain is not getting any better and it pulsates. Denies recent surgery, travel or immobilization.   Had a DVT, unprovoked in 2009 of the right calf and states this feels similar. He was placed on lovenox bridge to warfarin then x 6 months. He has also recently had a TIA (thought to have been embolic from plaque as he had MI shortly after). This was June 5th 2018. He underwent balloon angioplasty, no stenting. Is currently on ASA 81mg  and plavix 75mg  daily as well as atrovastatin 80mg .   He does report family history of DVT in mother as well. Reports he had coag studies done with first unprovoked DVT and all work up was negative.      No Known Allergies   Current Outpatient Medications:  .  aspirin EC 81 MG EC tablet, Take 1 tablet (81 mg total) by mouth daily. (Patient taking differently: Take 81 mg by mouth at bedtime. ), Disp: , Rfl:  .  atorvastatin (LIPITOR) 80 MG tablet, TAKE 1 TABLET BY MOUTH EVERY DAY, Disp: 90 tablet, Rfl: 1 .  clopidogrel (PLAVIX) 75 MG tablet, TAKE 1 TABLET BY MOUTH EVERY DAY, Disp: 90 tablet, Rfl: 1 .  metoprolol tartrate (LOPRESSOR) 25 MG tablet, TAKE 1 TABLET BY MOUTH TWICE A DAY, Disp: 180 tablet, Rfl: 1 .  nitroGLYCERIN (NITROSTAT) 0.4 MG SL tablet, Place 1 tablet (0.4 mg total) under the tongue every 5 (five) minutes as needed for chest pain., Disp: 25 tablet, Rfl: 12 .  pantoprazole (PROTONIX) 40 MG tablet, TAKE 1 TABLET BY MOUTH EVERY DAY, Disp: 90 tablet, Rfl: 1 .   sertraline (ZOLOFT) 100 MG tablet, Take 0.5 tablets (50 mg total) by mouth at bedtime as needed., Disp: 90 tablet, Rfl: 4 .  valsartan (DIOVAN) 80 MG tablet, TAKE 1 TABLET BY MOUTH EVERY DAY, Disp: 90 tablet, Rfl: 1  Review of Systems  Constitutional: Negative.   Respiratory: Negative.   Cardiovascular: Positive for leg swelling. Negative for chest pain and palpitations.  Gastrointestinal: Negative.   Musculoskeletal: Positive for myalgias (left calf).  Neurological: Negative.     Social History   Tobacco Use  . Smoking status: Never Smoker  . Smokeless tobacco: Former Systems developer    Types: Chew  Substance Use Topics  . Alcohol use: No    Alcohol/week: 0.6 oz    Types: 1 Standard drinks or equivalent per week   Objective:   BP (!) 160/100 (BP Location: Left Arm, Patient Position: Sitting, Cuff Size: Large)   Pulse 85   Temp 98.3 F (36.8 C) (Oral)   Resp 16   Ht 5\' 10"  (1.778 m)   Wt 276 lb 3.2 oz (125.3 kg)   SpO2 97%   BMI 39.63 kg/m    Physical Exam  Constitutional: He appears well-developed and well-nourished. No distress.  HENT:  Head: Normocephalic and atraumatic.  Neck: Normal range of motion.  Neck supple.  Cardiovascular: Normal rate, regular rhythm, normal heart sounds and intact distal pulses. Exam reveals no gallop and no friction rub.  No murmur heard. Pulmonary/Chest: Effort normal and breath sounds normal. No respiratory distress. He has no wheezes. He has no rales.  Musculoskeletal: He exhibits edema (trace pititng edema left leg, none in right; left leg at ankle, bottom of calf, mid calf and just below knee measures 1cm larger than right).  Positive Homan's on left for pain, no palpable cords  Skin: Skin is warm and dry. He is not diaphoretic. No erythema.  No discoloration noted of left leg  Vitals reviewed.  CLINICAL DATA: 47 year old male with left calf pain and swelling  EXAM: LEFT LOWER EXTREMITY VENOUS DOPPLER ULTRASOUND  TECHNIQUE: Gray-scale  sonography with graded compression, as well as color Doppler and duplex ultrasound were performed to evaluate the lower extremity deep venous systems from the level of the common femoral vein and including the common femoral, femoral, profunda femoral, popliteal and calf veins including the posterior tibial, peroneal and gastrocnemius veins when visible. The superficial great saphenous vein was also interrogated. Spectral Doppler was utilized to evaluate flow at rest and with distal augmentation maneuvers in the common femoral, femoral and popliteal veins.  COMPARISON: Prior left lower extremity venous ultrasound 10/12/2012  FINDINGS: Contralateral Common Femoral Vein: Respiratory phasicity is normal and symmetric with the symptomatic side. No evidence of thrombus. Normal compressibility.  Common Femoral Vein: No evidence of thrombus. Normal compressibility, respiratory phasicity and response to augmentation.  Saphenofemoral Junction: No evidence of thrombus. Normal compressibility and flow on color Doppler imaging.  Profunda Femoral Vein: No evidence of thrombus. Normal compressibility and flow on color Doppler imaging.  Femoral Vein: No evidence of thrombus. Normal compressibility, respiratory phasicity and response to augmentation.  Popliteal Vein: No evidence of thrombus. Normal compressibility, respiratory phasicity and response to augmentation.  Calf Veins: The peroneal veins are patent and compressible. However, the posterior tibial veins are abnormal. The veins are not compressible and demonstrate no evidence of flow on color Doppler imaging. Findings are consistent with isolated calf DVT.  Superficial Great Saphenous Vein: No evidence of thrombus. Normal compressibility.  Venous Reflux: None.  Other Findings: None.  IMPRESSION: 1. Positive for isolated calf deep venous thrombosis involving the paired posterior tibial veins. 2. No evidence of acute DVT above  the knee.  These results will be called to the ordering clinician or representative by the Radiologist Assistant, and communication documented in the PACS or zVision Dashboard.   Electronically Signed By: Jacqulynn Cadet M.D. On: 07/31/2017 16:49    Assessment & Plan:     1. Pain of left calf Possible DVT of left lower leg. Will get [place] as below. May need hem/onc referral if he has a second unprovoked DVT while being on dual antiplatelet therapy due to MI and TIA last summer. I will f/u pending results of [place]. If negative for DVT may be pain in calf stemming from left knee arthralgias. Does report also having worsening left knee pain. Has MRI ordered from orthopedics to evaluate the left knee for consideration of surgical intervention.  - US Venous Img Lower Unilateral Left; Future  2. Left leg swelling See above medical treatment plan. - US Venous Img Lower Unilateral Left; Future  3. History of DVT (deep vein thrombosis) Right leg unprovoked.  - US Venous Img Lower Unilateral Left; Future  4. Acute deep vein thrombosis (DVT) of tibial vein of left lower extremity (HCC) 2nd unprovoked  DVT with history of TIA and MI last year that also sound to be embolic in nature. Patient developed DVT with ASA and plavix. Will have him return tomorrow morning at 8am and discuss treatment options. I do feel he will need a referral to hematology for a newer coag workup.       Mar Daring, PA-C  [facility]

## 2017-08-01 ENCOUNTER — Encounter: Payer: Self-pay | Admitting: Physician Assistant

## 2017-08-01 ENCOUNTER — Ambulatory Visit (INDEPENDENT_AMBULATORY_CARE_PROVIDER_SITE_OTHER): Payer: BLUE CROSS/BLUE SHIELD | Admitting: Physician Assistant

## 2017-08-01 VITALS — BP 130/70 | HR 68 | Temp 98.8°F | Resp 16 | Wt 275.2 lb

## 2017-08-01 DIAGNOSIS — I82442 Acute embolism and thrombosis of left tibial vein: Secondary | ICD-10-CM

## 2017-08-01 NOTE — Patient Instructions (Signed)
Deep Vein Thrombosis Deep vein thrombosis (DVT) is a condition in which a blood clot forms in a deep vein, such as a lower leg, thigh, or arm vein. A clot is blood that has thickened into a gel or solid. This condition is dangerous. It can lead to serious and even life-threatening complications if the clot travels to the lungs and causes a blockage (pulmonary embolism). It can also damage veins in the leg. This can result in leg pain, swelling, discoloration, and sores (post-thrombotic syndrome). What are the causes? This condition may be caused by:  A slowdown of blood flow.  Damage to a vein.  A condition that makes blood clot more easily.  What increases the risk? The following factors may make you more likely to develop this condition:  Being overweight.  Being elderly, especially over age 60.  Sitting or lying down for more than four hours.  Lack of physical activity (sedentary lifestyle).  Being pregnant, giving birth, or having recently given birth.  Taking medicines that contain estrogen.  Smoking.  A history of any of the following: ? Blood clots or blood clotting disease. ? Peripheral vascular disease. ? Inflammatory bowel disease. ? Cancer. ? Heart disease. ? Genetic conditions that affect how blood clots. ? Neurological diseases that affect the legs (leg paresis). ? Injury. ? Major or lengthy surgery. ? A central line placed inside a large vein.  What are the signs or symptoms? Symptoms of this condition include:  Swelling, pain, or tenderness in an arm or leg.  Warmth, redness, or discoloration in an arm or leg.  If the clot is in your leg, symptoms may be more noticeable or worse when you stand or walk. Some people do not have any symptoms. How is this diagnosed? This condition is diagnosed with:  A medical history.  A physical exam.  Tests, such as: ? Blood tests. These are done to see how your blood clots. ? Imaging tests. These are done to  check for clots. Tests may include:  Ultrasound.  CT scan.  MRI.  X-ray.  Venogram. For this test, X-rays are taken after a dye is injected into a vein.  How is this treated? Treatment for this condition depends on the cause, your risk for bleeding or developing more clots, and any medical conditions you have. Treatment may include:  Taking blood thinners (also called anticoagulants). These medicines may be taken by mouth, injected under the skin, or injected through an IV tube (catheter). These medicines prevent clots from forming.  Injecting medicine that dissolves blood clots into the affected vein (catheter-directed thrombolysis).  Having surgery. Surgery may be done to: ? Remove the clot. ? Place a filter in a large vein to catch blood clots before they reach the lungs.  Some treatments may be continued for up to six months. Follow these instructions at home: If you are taking an oral blood thinner:  Take the medicine exactly as told by your health care provider. Some blood thinners need to be taken at the same time every day. Do not skip a dose.  Ask your health care provider about what foods and drugs interact with the medicine.  Ask about possible side effects. General instructions  Blood thinners can cause easy bruising and difficulty stopping bleeding. Because of this, if you are taking or were given a blood thinner: ? Hold pressure over cuts for longer than usual. ? Tell your dentist and other health care providers that you are taking blood thinners before   having any procedures that can cause bleeding. ? Avoid contact sports.  Take over-the-counter and prescription medicines only as told by your health care provider.  Return to your normal activities as told by your health care provider. Ask your health care provider what activities are safe for you.  Wear compression stockings if recommended by your health care provider.  Keep all follow-up visits as told by  your health care provider. This is important. How is this prevented? To lower your risk of developing this condition again:  For 30 or more minutes every day, do an activity that: ? Involves moving your arms and legs. ? Increases your heart rate.  When traveling for longer than four hours: ? Exercise your arms and legs every hour. ? Drink plenty of water. ? Avoid drinking alcohol.  Avoid sitting or lying for a long time without moving your legs.  Stay a healthy weight.  If you are a woman who is older than age 35, avoid unnecessary use of medicines that contain estrogen.  Do not use any products that contain nicotine or tobacco, such as cigarettes and e-cigarettes. This is especially important if you take estrogen medicines. If you need help quitting, ask your health care provider.  Contact a health care provider if:  You miss a dose of your blood thinner.  You have nausea, vomiting, or diarrhea that lasts for more than one day.  Your menstrual period is heavier than usual.  You have unusual bruising. Get help right away if:  You have new or increased pain, swelling, or redness in an arm or leg.  You have numbness or tingling in an arm or leg.  You have shortness of breath.  You have chest pain.  You have a rapid or irregular heartbeat.  You feel light-headed or dizzy.  You cough up blood.  There is blood in your vomit, stool, or urine.  You have a serious fall or accident, or you hit your head.  You have a severe headache or confusion.  You have a cut that will not stop bleeding. These symptoms may represent a serious problem that is an emergency. Do not wait to see if the symptoms will go away. Get medical help right away. Call your local emergency services (911 in the [place] Do not drive yourself to the hospital. Summary  DVT is a condition in which a blood clot forms in a deep vein, such as a lower leg, thigh, or arm vein.  Symptoms can include swelling,  warmth, pain, and redness in your leg or arm.  Treatment may include taking blood thinners, injecting medicine that dissolves blood clots,wearing compression stockings, or surgery.  If you are prescribed blood thinners, take them exactly as told. This information is not intended to replace advice given to you by your health care provider. Make sure you discuss any questions you have with your health care provider. Document Released: 02/04/2005 Document Revised: 03/09/2016 Document Reviewed: 03/09/2016 Elsevier Interactive Patient Education  2018 [employer] Inc[employer]  

## 2017-08-01 NOTE — Progress Notes (Signed)
Patient: Eduardo Poole Male    DOB: 1970-11-05   47 y.o.   MRN: [id number] Visit Date: 08/01/2017  Today's Provider: Mar Daring, PA-C   Chief Complaint  Patient presents with  . Follow-up    DVT   Subjective:    HPI Patient here today for follow-up. Patient was seen yesterday for concerns of possible blood clot on left calf. Patient had US done and it showed positive for isolated calf deep venous thrombosis involving the paired posterior tibial veins. See below report:  CLINICAL DATA:  47 year old male with left calf pain and swelling  EXAM: LEFT LOWER EXTREMITY VENOUS DOPPLER ULTRASOUND  TECHNIQUE: Gray-scale sonography with graded compression, as well as color Doppler and duplex ultrasound were performed to evaluate the lower extremity deep venous systems from the level of the common femoral vein and including the common femoral, femoral, profunda femoral, popliteal and calf veins including the posterior tibial, peroneal and gastrocnemius veins when visible. The superficial great saphenous vein was also interrogated. Spectral Doppler was utilized to evaluate flow at rest and with distal augmentation maneuvers in the common femoral, femoral and popliteal veins.  COMPARISON:  Prior left lower extremity venous ultrasound 10/12/2012  FINDINGS: Contralateral Common Femoral Vein: Respiratory phasicity is normal and symmetric with the symptomatic side. No evidence of thrombus. Normal compressibility.  Common Femoral Vein: No evidence of thrombus. Normal compressibility, respiratory phasicity and response to augmentation.  Saphenofemoral Junction: No evidence of thrombus. Normal compressibility and flow on color Doppler imaging.  Profunda Femoral Vein: No evidence of thrombus. Normal compressibility and flow on color Doppler imaging.  Femoral Vein: No evidence of thrombus. Normal compressibility, respiratory phasicity and response to  augmentation.  Popliteal Vein: No evidence of thrombus. Normal compressibility, respiratory phasicity and response to augmentation.  Calf Veins: The peroneal veins are patent and compressible. However, the posterior tibial veins are abnormal. The veins are not compressible and demonstrate no evidence of flow on color Doppler imaging. Findings are consistent with isolated calf DVT.  Superficial Great Saphenous Vein: No evidence of thrombus. Normal compressibility.  Venous Reflux:  None.  Other Findings:  None.  IMPRESSION: 1. Positive for isolated calf deep venous thrombosis involving the paired posterior tibial veins. 2. No evidence of acute DVT above the knee.  These results will be called to the ordering clinician or representative by the Radiologist Assistant, and communication documented in the PACS or zVision Dashboard.   Electronically Signed   By: Jacqulynn Cadet M.D.   On: 07/31/2017 16:49    No Known Allergies   Current Outpatient Medications:  .  aspirin EC 81 MG EC tablet, Take 1 tablet (81 mg total) by mouth daily. (Patient taking differently: Take 81 mg by mouth at bedtime. ), Disp: , Rfl:  .  atorvastatin (LIPITOR) 80 MG tablet, TAKE 1 TABLET BY MOUTH EVERY DAY, Disp: 90 tablet, Rfl: 1 .  clopidogrel (PLAVIX) 75 MG tablet, TAKE 1 TABLET BY MOUTH EVERY DAY, Disp: 90 tablet, Rfl: 1 .  metoprolol tartrate (LOPRESSOR) 25 MG tablet, TAKE 1 TABLET BY MOUTH TWICE A DAY, Disp: 180 tablet, Rfl: 1 .  nitroGLYCERIN (NITROSTAT) 0.4 MG SL tablet, Place 1 tablet (0.4 mg total) under the tongue every 5 (five) minutes as needed for chest pain., Disp: 25 tablet, Rfl: 12 .  pantoprazole (PROTONIX) 40 MG tablet, TAKE 1 TABLET BY MOUTH EVERY DAY, Disp: 90 tablet, Rfl: 1 .  sertraline (ZOLOFT) 100 MG tablet, Take 0.5 tablets (50  mg total) by mouth at bedtime as needed., Disp: 90 tablet, Rfl: 4 .  valsartan (DIOVAN) 80 MG tablet, TAKE 1 TABLET BY MOUTH EVERY DAY, Disp:  90 tablet, Rfl: 1  Review of Systems  Constitutional: Negative.   Respiratory: Negative.   Cardiovascular: Positive for leg swelling.  Musculoskeletal: Positive for myalgias.  Neurological: Negative.     Social History   Tobacco Use  . Smoking status: Never Smoker  . Smokeless tobacco: Former Systems developer    Types: Chew  Substance Use Topics  . Alcohol use: No    Alcohol/week: 0.6 oz    Types: 1 Standard drinks or equivalent per week   Objective:   BP 130/70 (BP Location: Left Wrist, Patient Position: Sitting, Cuff Size: Normal)   Pulse 68   Temp 98.8 F (37.1 C) (Oral)   Resp 16   Wt 275 lb 3.2 oz (124.8 kg)   SpO2 98%   BMI 39.49 kg/m     Physical Exam  Constitutional: He appears well-developed and well-nourished. No distress.  HENT:  Head: Normocephalic and atraumatic.  Neck: Normal range of motion. Neck supple.  Musculoskeletal: He exhibits edema (trace pitting edema L LE).  Skin: Skin is warm. Capillary refill takes less than 2 seconds. He is not diaphoretic. No erythema.  Vitals reviewed.       Assessment & Plan:     1. Acute deep vein thrombosis (DVT) of tibial vein of left lower extremity (HCC) New onset, unprovoked left LE DVT in tibial veins. Has remote history of RLE DVT many years ago. Also had "embolic" TIA and MI last. Currently on ASA and plavix and still developed DVT. Does report mother has history of "clots" and has a clotting disorder. Will refer to Hematology for further coag work up. Xarelto starter pack sample was given to patient to start once labs have been drawn for coag work up. Advised of warning signs to return or go to the hospital. He is in agreement. Continue ASA and plavix, push fluids. Has planned vacation to [facility] in 2 weeks. Advised to stop often and ambulate every 45 min-1 hr. I did also speak with Dr. Caryn Section (PCP) and he is in agreement with treatment plan. Will need repeat US in 3 months.  - Ambulatory referral to Hematology        Mar Daring, PA-C  [facility]

## 2017-08-03 DIAGNOSIS — I82402 Acute embolism and thrombosis of unspecified deep veins of left lower extremity: Secondary | ICD-10-CM | POA: Insufficient documentation

## 2017-08-03 NOTE — Progress Notes (Signed)
[place]  [phone] [phone]  ID: Calton Golds OB: 11/20/1970  MR#: [id number]  VEH#:[id number]  Patient Care Team: Birdie Sons, MD as PCP - General (Family Medicine) Burnis Kingfisher, MD as Referring Physician (Orthopedic Surgery) Beverly Gust, MD (Unknown Physician Specialty)  CHIEF COMPLAINT: Left leg DVT.  INTERVAL HISTORY: Patient is a 47 year old male who recently was diagnosed with a tibial DVT.  He has a personal history of DVT approximately 10 years ago.  He has not started anticoagulation, but was given a Xarelto starter pack.  He continues to have significant leg pain, but otherwise feels well.  He has no neurologic complaints.  He denies any recent fevers or illnesses.  He has a good appetite and denies weight loss.  He has no chest pain, cough, hemoptysis, or shortness of breath.  He denies any nausea, vomiting, constipation, or diarrhea.  He has no urinary complaints.  Patient offers no further specific complaints.  REVIEW OF SYSTEMS:   Review of Systems  Constitutional: Negative.  Negative for fever, malaise/fatigue and weight loss.  Respiratory: Negative.  Negative for cough, hemoptysis and shortness of breath.   Cardiovascular: Negative.  Negative for chest pain and leg swelling.  Gastrointestinal: Negative.  Negative for abdominal pain.  Genitourinary: Negative.  Negative for dysuria.  Musculoskeletal: Negative.        Left calf pain.  Skin: Negative.  Negative for rash.  Neurological: Negative.  Negative for sensory change, focal weakness, weakness and headaches.  Psychiatric/Behavioral: Negative.  The patient is not nervous/anxious.     As per HPI. Otherwise, a complete review of systems is negative.  PAST MEDICAL HISTORY: Past Medical History:  Diagnosis Date  . CAD (coronary artery disease)    a. 07/2014 Inf STEMI/PTCA:  LM nl, LAD nl, LCX nl, RCA ulcerated plaque (PTCA only);  b. 10/2014 MV: intermediate  risk, ? inflat isch/scar;  c. 10/2014 Cath: Nl cors w/o RCA restenosis.  . Diastolic dysfunction    a. 07/2014 Echo: EF 60-65%, Gr1 DD.  . DVT of lower extremity (deep venous thrombosis) ([place])    a. ~ 2009, per pt was on lovenox/coumadin  . GERD (gastroesophageal reflux disease)   . Hyperlipidemia   . Hypertension   . Hypertensive heart disease   . TIA (transient ischemic attack) 07/23/2014    PAST SURGICAL HISTORY: Past Surgical History:  Procedure Laterality Date  . ARTHROPLASTY Right 07/09/2010  . CARDIAC CATHETERIZATION N/A 07/23/2014   Procedure: Left Heart Cath;  Surgeon: Troy Sine, MD;  Location: [place] [facility];  Service: Cardiovascular;  Laterality: N/A;  . CARDIAC CATHETERIZATION N/A 07/23/2014   Procedure: Coronary Balloon Angioplasty;  Surgeon: Troy Sine, MD;  Location: [place] [facility];  Service: Cardiovascular;  Laterality: N/A;  . CARDIAC CATHETERIZATION N/A 11/10/2014   Procedure: Left Heart Cath and Coronary Angiography;  Surgeon: Troy Sine, MD;  Location: [place] [facility];  Service: Cardiovascular;  Laterality: N/A;  . CERVICAL [place] SURGERY  2015  . JOINT REPLACEMENT    . MENISCUS REPAIR Right 11/03/1995   Meniscus Lateral Tear: repaired by Dr. Sabra Heck at [facility]  . NASAL SINUS SURGERY     Deviated Septum  . REPLACEMENT TOTAL KNEE Right 2011   [facility]    FAMILY HISTORY: Family History  Problem Relation Age of Onset  . Hypertension Mother   . Arthritis Mother   . Diverticulitis Mother   . Gout Father   .  Arthritis Sister   . Diabetes Paternal Grandfather   . Diabetes Maternal Grandfather   . Cancer Maternal Grandfather        unknown    ADVANCED DIRECTIVES (Y/N):  N  HEALTH MAINTENANCE: Social History   Tobacco Use  . Smoking status: Never Smoker  . Smokeless tobacco: Current User    Types: Chew  Substance Use Topics  . Alcohol use: No    Alcohol/week: 0.6 oz    Types: 1 Standard drinks or  equivalent per week    Comment: social drinker  . Drug use: No     Colonoscopy:  PAP:  Bone density:  Lipid panel:  No Known Allergies  Current Outpatient Medications  Medication Sig Dispense Refill  . aspirin EC 81 MG EC tablet Take 1 tablet (81 mg total) by mouth daily. (Patient taking differently: Take 81 mg by mouth at bedtime. )    . atorvastatin (LIPITOR) 80 MG tablet TAKE 1 TABLET BY MOUTH EVERY DAY 90 tablet 1  . clopidogrel (PLAVIX) 75 MG tablet TAKE 1 TABLET BY MOUTH EVERY DAY 90 tablet 1  . metoprolol tartrate (LOPRESSOR) 25 MG tablet TAKE 1 TABLET BY MOUTH TWICE A DAY (Patient taking differently: TAKE 1 TABLET BY MOUTH once per day) 180 tablet 1  . omeprazole (PRILOSEC) 20 MG capsule Take 20 mg by mouth daily.     . pantoprazole (PROTONIX) 40 MG tablet TAKE 1 TABLET BY MOUTH EVERY DAY 90 tablet 1  . sertraline (ZOLOFT) 100 MG tablet Take 0.5 tablets (50 mg total) by mouth at bedtime as needed. 90 tablet 4  . valsartan (DIOVAN) 80 MG tablet TAKE 1 TABLET BY MOUTH EVERY DAY 90 tablet 1  . nitroGLYCERIN (NITROSTAT) 0.4 MG SL tablet Place 1 tablet (0.4 mg total) under the tongue every 5 (five) minutes as needed for chest pain. (Patient not taking: Reported on 08/05/2017) 25 tablet 12  . rivaroxaban (XARELTO) 20 MG TABS tablet Take 1 tablet (20 mg total) by mouth daily with supper. Start after completion of 21 day starter pack. 30 tablet 3   No current facility-administered medications for this visit.     OBJECTIVE: Vitals:   08/05/17 1513  BP: (!) 142/95  Pulse: 76  Resp: 18  Temp: (!) 96.2 F (35.7 C)     Body mass index is 38.82 kg/m.    ECOG FS:0 - Asymptomatic  General: Well-developed, well-nourished, no acute distress. Eyes: Pink conjunctiva, anicteric sclera. HEENT: Normocephalic, moist mucous membranes, clear oropharnyx. Lungs: Clear to auscultation bilaterally. Heart: Regular rate and rhythm. No rubs, murmurs, or gallops. Abdomen: Soft, nontender,  nondistended. No organomegaly noted, normoactive bowel sounds. Musculoskeletal: No edema, cyanosis, or clubbing. Neuro: Alert, answering all questions appropriately. Cranial nerves grossly intact. Skin: No rashes or petechiae noted. Psych: Normal affect. Lymphatics: No cervical, calvicular, axillary or inguinal LAD.   LAB RESULTS:  Lab Results  Component Value Date   NA 140 11/20/2016   K 4.8 11/20/2016   CL 104 11/20/2016   CO2 24 11/20/2016   GLUCOSE 103 (H) 11/20/2016   BUN 20 11/20/2016   CREATININE 0.98 11/20/2016   CALCIUM 9.9 11/20/2016   PROT 6.8 11/20/2016   ALBUMIN 4.3 11/20/2016   AST 26 11/20/2016   ALT 42 11/20/2016   ALKPHOS 72 11/20/2016   BILITOT 0.5 11/20/2016   GFRNONAA 92 11/20/2016   GFRAA 106 11/20/2016    Lab Results  Component Value Date   WBC 7.9 11/20/2016   NEUTROABS 4.0 09/19/2015  HGB 14.5 11/20/2016   HCT 41.5 11/20/2016   MCV 88 11/20/2016   PLT 299 11/20/2016     STUDIES: US Venous Img Lower Unilateral Left  Result Date: 07/31/2017 CLINICAL DATA:  47 year old male with left calf pain and swelling EXAM: LEFT LOWER EXTREMITY VENOUS DOPPLER ULTRASOUND TECHNIQUE: Gray-scale sonography with graded compression, as well as color Doppler and duplex ultrasound were performed to evaluate the lower extremity deep venous systems from the level of the common femoral vein and including the common femoral, femoral, profunda femoral, popliteal and calf veins including the posterior tibial, peroneal and gastrocnemius veins when visible. The superficial great saphenous vein was also interrogated. Spectral Doppler was utilized to evaluate flow at rest and with distal augmentation maneuvers in the common femoral, femoral and popliteal veins. COMPARISON:  Prior left lower extremity venous ultrasound 10/12/2012 FINDINGS: Contralateral Common Femoral Vein: Respiratory phasicity is normal and symmetric with the symptomatic side. No evidence of thrombus. Normal  compressibility. Common Femoral Vein: No evidence of thrombus. Normal compressibility, respiratory phasicity and response to augmentation. Saphenofemoral Junction: No evidence of thrombus. Normal compressibility and flow on color Doppler imaging. Profunda Femoral Vein: No evidence of thrombus. Normal compressibility and flow on color Doppler imaging. Femoral Vein: No evidence of thrombus. Normal compressibility, respiratory phasicity and response to augmentation. Popliteal Vein: No evidence of thrombus. Normal compressibility, respiratory phasicity and response to augmentation. Calf Veins: The peroneal veins are patent and compressible. However, the posterior tibial veins are abnormal. The veins are not compressible and demonstrate no evidence of flow on color Doppler imaging. Findings are consistent with isolated calf DVT. Superficial Great Saphenous Vein: No evidence of thrombus. Normal compressibility. Venous Reflux:  None. Other Findings:  None. IMPRESSION: 1. Positive for isolated calf deep venous thrombosis involving the paired posterior tibial veins. 2. No evidence of acute DVT above the knee. These results will be called to the ordering clinician or representative by the Radiologist Assistant, and communication documented in the PACS or zVision Dashboard. Electronically Signed   By: Jacqulynn Cadet M.D.   On: 07/31/2017 16:49    ASSESSMENT: Left leg DVT  PLAN:  1. Left leg DVT: Possibly secondary to sedentary lifestyle, particularly at work.  Ultrasound results reviewed independently and report as above with no evidence of DVT above the knee.  Typically, a below the knee DVT does not require treatment but given patient's second DVT in lifetime, have recommended proceeding with 3 months of Xarelto.  He will initiate the starter pack given to him and then continue with 20 mg daily.  Full hypercoagulable work-up from today is negative except for factor V Leiden and prothrombin gene mutation which are  pending at time of dictation.  Return to clinic in 3 months to discuss his laboratory work and whether or not to continue anticoagulation for a longer period of time.  I spent a total of 45 minutes face-to-face with the patient of which greater than 50% of the visit was spent in counseling and coordination of care as summarized above.  Patient expressed understanding and was in agreement with this plan. He also understands that He can call clinic at any time with any questions, concerns, or complaints.    Lloyd Huger, MD   08/10/2017 8:41 AM

## 2017-08-05 ENCOUNTER — Inpatient Hospital Stay: Payer: BLUE CROSS/BLUE SHIELD

## 2017-08-05 ENCOUNTER — Other Ambulatory Visit: Payer: Self-pay

## 2017-08-05 ENCOUNTER — Inpatient Hospital Stay: Payer: BLUE CROSS/BLUE SHIELD | Attending: Oncology | Admitting: Oncology

## 2017-08-05 ENCOUNTER — Encounter: Payer: Self-pay | Admitting: Oncology

## 2017-08-05 DIAGNOSIS — I2510 Atherosclerotic heart disease of native coronary artery without angina pectoris: Secondary | ICD-10-CM | POA: Diagnosis not present

## 2017-08-05 DIAGNOSIS — Z809 Family history of malignant neoplasm, unspecified: Secondary | ICD-10-CM | POA: Diagnosis not present

## 2017-08-05 DIAGNOSIS — Z7901 Long term (current) use of anticoagulants: Secondary | ICD-10-CM | POA: Insufficient documentation

## 2017-08-05 DIAGNOSIS — K219 Gastro-esophageal reflux disease without esophagitis: Secondary | ICD-10-CM | POA: Insufficient documentation

## 2017-08-05 DIAGNOSIS — Z7982 Long term (current) use of aspirin: Secondary | ICD-10-CM | POA: Insufficient documentation

## 2017-08-05 DIAGNOSIS — I82442 Acute embolism and thrombosis of left tibial vein: Secondary | ICD-10-CM

## 2017-08-05 DIAGNOSIS — Z8673 Personal history of transient ischemic attack (TIA), and cerebral infarction without residual deficits: Secondary | ICD-10-CM | POA: Diagnosis not present

## 2017-08-05 DIAGNOSIS — E785 Hyperlipidemia, unspecified: Secondary | ICD-10-CM | POA: Insufficient documentation

## 2017-08-05 DIAGNOSIS — Z79899 Other long term (current) drug therapy: Secondary | ICD-10-CM | POA: Diagnosis not present

## 2017-08-05 LAB — ANTITHROMBIN III: AntiThromb III Func: 94 % (ref 75–120)

## 2017-08-05 MED ORDER — RIVAROXABAN 20 MG PO TABS: 20.0000 mg | ORAL_TABLET | Freq: Every day | ORAL | 3 refills | Status: DC

## 2017-08-05 NOTE — Progress Notes (Signed)
Here for new pt evaluation.  

## 2017-08-06 LAB — HOMOCYSTEINE: Homocysteine: 14.2 umol/L (ref 0.0–15.0)

## 2017-08-06 LAB — CARDIOLIPIN ANTIBODIES, IGG, IGM, IGA
Anticardiolipin IgG: <9 GPL U/mL (ref 0–14)
Anticardiolipin IgM: <9 MPL U/mL (ref 0–12)

## 2017-08-06 LAB — LUPUS ANTICOAGULANT PANEL
DRVVT: 34.6 s (ref 0.0–47.0)
PTT LA: 30 s (ref 0.0–51.9)

## 2017-08-06 LAB — PROTEIN C ACTIVITY: Protein C Activity: 100 % (ref 73–180)

## 2017-08-06 LAB — PROTEIN C, TOTAL: PROTEIN C, TOTAL: 94 % (ref 60–150)

## 2017-08-06 LAB — PROTEIN S ACTIVITY: Protein S Activity: 111 % (ref 63–140)

## 2017-08-06 LAB — PROTEIN S, TOTAL: Protein S Ag, Total: 88 % (ref 60–150)

## 2017-08-07 LAB — BETA-2-GLYCOPROTEIN I ABS, IGG/M/A: Beta-2 Glyco I IgG: <9 GPI IgG units (ref 0–20)

## 2017-08-11 LAB — PROTHROMBIN GENE MUTATION

## 2017-08-11 LAB — FACTOR 5 LEIDEN

## 2017-09-18 NOTE — Progress Notes (Signed)
Patient: Eduardo Poole, Male    DOB: 1970-03-05, 47 y.o.   MRN: [id number] Visit Date: 09/19/2017  Today's Provider: Lelon Huh, MD   Chief Complaint  Patient presents with  . Annual Exam  . Hypertension  . Hyperlipidemia   Subjective:    Annual physical exam Eduardo Poole is a 47 y.o. male who presents today for health maintenance and complete physical. He feels well. He reports exercising some. He reports he is sleeping poorly.  ---------------------------------------------------------------   Hypertension, follow-up:  BP Readings from Last 3 Encounters:  09/19/17 137/90  08/05/17 (!) 142/95  08/01/17 130/70    He was last seen for hypertension 1 years ago.  BP at that visit was 120/80. Management since that visit includes; no changes.He reports good compliance with treatment. He is not having side effects. none He is exercising. He is not adherent to low salt diet.   Outside blood pressures are normal. He is experiencing none.  Patient denies none.   Cardiovascular risk factors include none.  Use of agents associated with hypertension: none.   ---------------------------------------------------------------    Lipid/Cholesterol, Follow-up:   Last seen for this 1 yearago.  Management since that visit includes; no changes.  Last Lipid Panel:    Component Value Date/Time   CHOL 108 11/20/2016 0733   TRIG 127 11/20/2016 0733   HDL 30 (L) 11/20/2016 0733   CHOLHDL 3.6 11/20/2016 0733   CHOLHDL 3.9 02/06/2016 1058   VLDL 30 02/06/2016 1058   LDLCALC 53 11/20/2016 0733    He reports good compliance with treatment. He is not having side effects. none  Wt Readings from Last 3 Encounters:  09/19/17 275 lb (124.7 kg)  08/05/17 277 lb 4.8 oz (125.8 kg)  08/01/17 275 lb 3.2 oz (124.8 kg)    ---------------------------------------------------------------  Acute deep vein thrombosis (DVT) of tibial vein of left lower extremity (Eduardo Poole) From  08/01/2017-saw Eduardo Poole. Referred to Hematology and was started on Xarelto. Had negative hematology work up. Is still taking clopidogrel and ECASA and has been having occasional nose bleeds.     Review of Systems  Constitutional: Positive for diaphoresis and fatigue.  HENT: Positive for ear pain, nosebleeds and tinnitus.   Eyes: Positive for photophobia and discharge.  Respiratory: Positive for apnea and cough.   Cardiovascular: Positive for palpitations and leg swelling.  Gastrointestinal: Negative.   Endocrine: Negative.   Genitourinary: Positive for difficulty urinating and urgency.  Musculoskeletal: Positive for arthralgias, back pain, myalgias, neck pain and neck stiffness.  Skin: Negative.   Allergic/Immunologic: Negative.   Neurological: Negative.   Hematological: Negative.   Psychiatric/Behavioral: Positive for decreased concentration, dysphoric mood and sleep disturbance. The patient is nervous/anxious.     Social History      He  reports that he has never smoked. His smokeless tobacco use includes chew. He reports that he does not drink alcohol or use drugs.       Social History   Socioeconomic History  . Marital status: Married    Spouse name: Not on file  . Number of children: 2  . Years of education: 47  . Highest education level: Not on file  Occupational History  . Occupation: Employed    Comment: Works at [employer]  . Financial resource strain: Not on file  . Food insecurity:    Worry: Not on file    Inability: Not on file  . Transportation needs:    Medical:  Not on file    Non-medical: Not on file  Tobacco Use  . Smoking status: Never Smoker  . Smokeless tobacco: Current User    Types: Chew  Substance and Sexual Activity  . Alcohol use: No    Alcohol/week: 0.6 oz    Types: 1 Standard drinks or equivalent per week    Comment: social drinker  . Drug use: No  . Sexual activity: Yes  Lifestyle  . Physical activity:    Days  per week: Not on file    Minutes per session: Not on file  . Stress: Not on file  Relationships  . Social connections:    Talks on phone: Not on file    Gets together: Not on file    Attends religious service: Not on file    Active member of club or organization: Not on file    Attends meetings of clubs or organizations: Not on file    Relationship status: Not on file  Other Topics Concern  . Not on file  Social History Narrative  . Not on file    Past Medical History:  Diagnosis Date  . CAD (coronary artery disease)    a. 07/2014 Inf STEMI/PTCA:  LM nl, LAD nl, LCX nl, RCA ulcerated plaque (PTCA only);  b. 10/2014 MV: intermediate risk, ? inflat isch/scar;  c. 10/2014 Cath: Nl cors w/o RCA restenosis.  . Diastolic dysfunction    a. 07/2014 Echo: EF 60-65%, Gr1 DD.  . DVT of lower extremity (deep venous thrombosis) ([place])    a. ~ 2009, per pt was on lovenox/coumadin  . GERD (gastroesophageal reflux disease)   . Hyperlipidemia   . Hypertension   . Hypertensive heart disease   . TIA (transient ischemic attack) 07/23/2014     Patient Active Problem List   Diagnosis Date Noted  . Left leg DVT (Eduardo Poole) 08/03/2017  . BMI 37.0-37.9, adult 09/17/2016  . OSA (obstructive sleep apnea) 01/18/2016  . Hyperlipidemia   . CAD (coronary artery disease)   . Hypertensive heart disease without heart failure   . DDD (degenerative disc disease), cervical 09/19/2015  . DDD (degenerative disc disease), lumbar 09/19/2015  . Hyperlipidemia LDL goal <70 07/15/2015  . Frequent PVCs 12/02/2014  . Abnormal stress test   . History of CVA in adulthood 10/20/2014  . Temporary cerebral vascular dysfunction 10/04/2014  . History of TIA (transient ischemic attack) 08/04/2014  . Diastolic dysfunction 22/97/9892  . Anxiety 08/02/2014  . Depression 08/02/2014  . Diplopia 08/02/2014  . Fatigue 08/02/2014  . Headache 08/02/2014  . H/O adenomatous polyp of colon 08/02/2014  . History of DVT (deep vein  thrombosis) 08/02/2014  . Hypertension 08/02/2014  . Insomnia 08/02/2014  . Lymphadenopathy 08/02/2014  . Sleep disorder 08/02/2014  . Chest wall pain 08/02/2014  . Pleurisy 08/02/2014  . ST elevation myocardial infarction (STEMI) involving right coronary artery with complication ([place]) 11/94/1740  . Combined fat and carbohydrate induced hyperlipemia 03/24/2014  . Breathlessness on exertion 03/24/2014  . History of knee surgery 07/18/2011  . Idiopathic localized osteoarthropathy 06/14/2011    Past Surgical History:  Procedure Laterality Date  . ARTHROPLASTY Right 07/09/2010  . CARDIAC CATHETERIZATION N/A 07/23/2014   Procedure: Left Heart Cath;  Surgeon: Troy Sine, MD;  Location: [place] [facility];  Service: Cardiovascular;  Laterality: N/A;  . CARDIAC CATHETERIZATION N/A 07/23/2014   Procedure: Coronary Balloon Angioplasty;  Surgeon: Troy Sine, MD;  Location: [place] [facility];  Service: Cardiovascular;  Laterality: N/A;  .  CARDIAC CATHETERIZATION N/A 11/10/2014   Procedure: Left Heart Cath and Coronary Angiography;  Surgeon: Troy Sine, MD;  Location: [place] [facility];  Service: Cardiovascular;  Laterality: N/A;  . CERVICAL [place] SURGERY  2015  . JOINT REPLACEMENT    . MENISCUS REPAIR Right 11/03/1995   Meniscus Lateral Tear: repaired by Dr. Sabra Heck at [facility]  . NASAL SINUS SURGERY     Deviated Septum  . REPLACEMENT TOTAL KNEE Right 2011   [facility]    Family History        Family Status  Relation Name Status  . Mother  Alive  . Father  Alive  . Sister  Alive  . PGF  (Not Specified)  . MGF  Deceased        His family history includes Arthritis in his mother and sister; Cancer in his maternal grandfather; Diabetes in his maternal grandfather and paternal grandfather; Diverticulitis in his mother; Gout in his father; Hypertension in his mother.      No Known Allergies   Current Outpatient Medications:  .  aspirin EC 81 MG EC  tablet, Take 1 tablet (81 mg total) by mouth daily. (Patient taking differently: Take 81 mg by mouth at bedtime. ), Disp: , Rfl:  .  atorvastatin (LIPITOR) 80 MG tablet, TAKE 1 TABLET BY MOUTH EVERY DAY, Disp: 90 tablet, Rfl: 1 .  clopidogrel (PLAVIX) 75 MG tablet, TAKE 1 TABLET BY MOUTH EVERY DAY, Disp: 90 tablet, Rfl: 1 .  metoprolol tartrate (LOPRESSOR) 25 MG tablet, TAKE 1 TABLET BY MOUTH TWICE A DAY (Patient taking differently: TAKE 1 TABLET BY MOUTH once per day), Disp: 180 tablet, Rfl: 1 .  nitroGLYCERIN (NITROSTAT) 0.4 MG SL tablet, Place 1 tablet (0.4 mg total) under the tongue every 5 (five) minutes as needed for chest pain., Disp: 25 tablet, Rfl: 12 .  omeprazole (PRILOSEC) 20 MG capsule, Take 20 mg by mouth daily. , Disp: , Rfl:  .  pantoprazole (PROTONIX) 40 MG tablet, TAKE 1 TABLET BY MOUTH EVERY DAY, Disp: 90 tablet, Rfl: 1 .  rivaroxaban (XARELTO) 20 MG TABS tablet, Take 1 tablet (20 mg total) by mouth daily with supper. Start after completion of 21 day starter pack., Disp: 30 tablet, Rfl: 3 .  sertraline (ZOLOFT) 100 MG tablet, Take 0.5 tablets (50 mg total) by mouth at bedtime as needed., Disp: 90 tablet, Rfl: 4 .  valsartan (DIOVAN) 80 MG tablet, TAKE 1 TABLET BY MOUTH EVERY DAY, Disp: 90 tablet, Rfl: 1   Patient Care Team: Birdie Sons, MD as PCP - General (Family Medicine) Burnis Kingfisher, MD as Referring Physician (Orthopedic Surgery) Beverly Gust, MD (Unknown Physician Specialty)      Objective:   Vitals: BP 137/90 (BP Location: Right Arm, Patient Position: Sitting, Cuff Size: Large)   Pulse 87   Temp 98 F (36.7 C) (Oral)   Resp 16   Ht 5\' 11"  (1.803 m)   Wt 275 lb (124.7 kg)   SpO2 96%   BMI 38.35 kg/m    Vitals:   09/19/17 1359  BP: 137/90  Pulse: 87  Resp: 16  Temp: 98 F (36.7 C)  TempSrc: Oral  SpO2: 96%  Weight: 275 lb (124.7 kg)  Height: 5\' 11"  (1.803 m)     Physical Exam   General Appearance:    Alert, cooperative, no distress,  appears stated age  Head:    Normocephalic, without obvious abnormality, atraumatic  Eyes:    PERRL, conjunctiva/corneas  clear, EOM's intact, fundi    benign, both eyes       Ears:    Normal TM's and external ear canals, both ears  Nose:   Nares normal, septum midline, mucosa normal, no drainage   or sinus tenderness  Throat:   Lips, mucosa, and tongue normal; teeth and gums normal  Neck:   Supple, symmetrical, trachea midline, no adenopathy;       thyroid:  No enlargement/tenderness/nodules; no carotid   bruit or JVD  Back:     Symmetric, no curvature, ROM normal, no CVA tenderness  Lungs:     Clear to auscultation bilaterally, respirations unlabored  Chest wall:    No tenderness or deformity  Heart:    Regular rate and rhythm, S1 and S2 normal, no murmur, rub   or gallop  Abdomen:     Soft, non-tender, bowel sounds active all four quadrants,    no masses, no organomegaly  Genitalia:    deferred  Rectal:    deferred  Extremities:   Extremities normal, atraumatic, no cyanosis or edema  Pulses:   2+ and symmetric all extremities  Skin:   Skin color, texture, turgor normal, no rashes or lesions  Lymph nodes:   Cervical, supraclavicular, and axillary nodes normal  Neurologic:   CNII-XII intact. Normal strength, sensation and reflexes      throughout    Depression Screen PHQ 2/9 Scores 09/19/2017 09/17/2016  PHQ - 2 Score 5 3  PHQ- 9 Score 22 14      Assessment & Plan:     Routine Health Maintenance and Physical Exam  Exercise Activities and Dietary recommendations Goals    None      Immunization History  Administered Date(s) Administered  . Influenza,inj,Quad PF,6+ Mos 12/02/2014  . Td 06/19/2010    Health Maintenance  Topic Date Due  . HIV Screening  12/19/1984  . INFLUENZA VACCINE  09/18/2017  . COLONOSCOPY  04/08/2019  . TETANUS/TDAP  06/18/2020     Discussed health benefits of physical activity, and encouraged him to engage in regular exercise appropriate  for his age and condition.    --------------------------------------------------------------------   1. Annual physical exam Generally doing well.  - CBC - Comprehensive metabolic panel - Lipid panel  2. BMI 38.0-38.9,adult Counseled regarding prudent diet and regular exercise.    3. Hypertensive heart disease without heart failure Stable, Continue current medications.   - CBC - Comprehensive metabolic panel - Lipid panel  4. OSA (obstructive sleep apnea)   5. Pure hypercholesterolemia  - CBC - Comprehensive metabolic panel - Lipid panel  6. DVT Midway through course of Xarelto, but having some nosebleeds. Contacted Dr. Claiborne Billings about possibly holding aspirin or clopidogrel while patient is taking Xarelto, and was advised patient could hold clopidogrel until he finishes Xarelto, but to continue aspirin.   Lelon Huh, MD  [facility]

## 2017-09-19 ENCOUNTER — Ambulatory Visit (INDEPENDENT_AMBULATORY_CARE_PROVIDER_SITE_OTHER): Payer: BLUE CROSS/BLUE SHIELD | Admitting: Family Medicine

## 2017-09-19 ENCOUNTER — Encounter: Payer: Self-pay | Admitting: Family Medicine

## 2017-09-19 VITALS — BP 137/90 | HR 87 | Temp 98.0°F | Resp 16 | Ht 71.0 in | Wt 275.0 lb

## 2017-09-19 DIAGNOSIS — G4733 Obstructive sleep apnea (adult) (pediatric): Secondary | ICD-10-CM

## 2017-09-19 DIAGNOSIS — I82442 Acute embolism and thrombosis of left tibial vein: Secondary | ICD-10-CM

## 2017-09-19 DIAGNOSIS — E7800 Pure hypercholesterolemia, unspecified: Secondary | ICD-10-CM

## 2017-09-19 DIAGNOSIS — Z0000 Encounter for general adult medical examination without abnormal findings: Secondary | ICD-10-CM | POA: Diagnosis not present

## 2017-09-19 DIAGNOSIS — Z6838 Body mass index (BMI) 38.0-38.9, adult: Secondary | ICD-10-CM | POA: Diagnosis not present

## 2017-09-19 DIAGNOSIS — I119 Hypertensive heart disease without heart failure: Secondary | ICD-10-CM

## 2017-09-20 LAB — COMPREHENSIVE METABOLIC PANEL
A/G RATIO: 1.7 (ref 1.2–2.2)
ALT: 46 IU/L — ABNORMAL HIGH (ref 0–44)
AST: 28 IU/L (ref 0–40)
Albumin: 4.4 g/dL (ref 3.5–5.5)
Alkaline Phosphatase: 77 IU/L (ref 39–117)
BILIRUBIN TOTAL: 0.2 mg/dL (ref 0.0–1.2)
BUN/Creatinine Ratio: 14 (ref 9–20)
BUN: 15 mg/dL (ref 6–24)
CALCIUM: 9.8 mg/dL (ref 8.7–10.2)
CO2: 24 mmol/L (ref 20–29)
Chloride: 102 mmol/L (ref 96–106)
Creatinine, Ser: 1.09 mg/dL (ref 0.76–1.27)
GFR, EST AFRICAN AMERICAN: 93 mL/min/{1.73_m2} (ref >59)
GFR, EST NON AFRICAN AMERICAN: 80 mL/min/{1.73_m2} (ref >59)
GLOBULIN, TOTAL: 2.6 g/dL (ref 1.5–4.5)
Glucose: 85 mg/dL (ref 65–99)
POTASSIUM: 4.5 mmol/L (ref 3.5–5.2)
Sodium: 140 mmol/L (ref 134–144)
TOTAL PROTEIN: 7 g/dL (ref 6.0–8.5)

## 2017-09-20 LAB — CBC
Hematocrit: 44.8 % (ref 37.5–51.0)
Hemoglobin: 14.4 g/dL (ref 13.0–17.7)
MCH: 29.3 pg (ref 26.6–33.0)
MCHC: 32.1 g/dL (ref 31.5–35.7)
MCV: 91 fL (ref 79–97)
PLATELETS: 321 10*3/uL (ref 150–450)
RBC: 4.91 x10E6/uL (ref 4.14–5.80)
RDW: 13.4 % (ref 12.3–15.4)
WBC: 8 10*3/uL (ref 3.4–10.8)

## 2017-09-20 LAB — LIPID PANEL
CHOLESTEROL TOTAL: 113 mg/dL (ref 100–199)
Chol/HDL Ratio: 4.2 ratio (ref 0.0–5.0)
HDL: 27 mg/dL — AB (ref >39)
LDL Calculated: 55 mg/dL (ref 0–99)
Triglycerides: 153 mg/dL — ABNORMAL HIGH (ref 0–149)
VLDL CHOLESTEROL CAL: 31 mg/dL (ref 5–40)

## 2017-09-25 ENCOUNTER — Telehealth: Payer: Self-pay | Admitting: Family Medicine

## 2017-09-25 NOTE — Telephone Encounter (Signed)
Patient was advised. Patient expressed understanding.  

## 2017-09-25 NOTE — Telephone Encounter (Signed)
LMOVM for pt to return call 

## 2017-09-25 NOTE — Telephone Encounter (Signed)
Please advise patient that discussed his meds with his cardiologist Dr. Claiborne Billings and he recommended that patient stop taking Plavix until he is finishes with Xarelto, but to continue taking aspirin. He should restart the Plavix as soon as he finishes Xarelto.

## 2017-10-22 ENCOUNTER — Encounter: Payer: Self-pay | Admitting: Family Medicine

## 2017-10-22 ENCOUNTER — Ambulatory Visit (INDEPENDENT_AMBULATORY_CARE_PROVIDER_SITE_OTHER): Payer: BLUE CROSS/BLUE SHIELD | Admitting: Family Medicine

## 2017-10-22 VITALS — BP 144/87 | HR 69 | Temp 98.7°F | Resp 16 | Wt 282.0 lb

## 2017-10-22 DIAGNOSIS — M5431 Sciatica, right side: Secondary | ICD-10-CM

## 2017-10-22 MED ORDER — CYCLOBENZAPRINE HCL 5 MG PO TABS: 5.0000 mg | ORAL_TABLET | Freq: Three times a day (TID) | ORAL | 0 refills | Status: DC | PRN

## 2017-10-22 MED ORDER — PREDNISONE 10 MG PO TABS: ORAL_TABLET | ORAL | 0 refills | Status: AC

## 2017-10-22 NOTE — Progress Notes (Signed)
Patient: Eduardo Poole Male    DOB: 07-03-1970   47 y.o.   MRN: [id number] Visit Date: 10/22/2017  Today's Provider: Lelon Huh, MD   Chief Complaint  Patient presents with  . Back Pain    x 3 days   Subjective:    Back Pain  This is a new problem. Episode onset: 3 days ago. The problem has been gradually worsening since onset. Pain location: tailbone area. Quality: sharp pain. The pain radiates to the right thigh. The symptoms are aggravated by sitting. Associated symptoms include numbness (in right buttocks) and tingling (in right buttocks). Pertinent negatives include no abdominal pain, chest pain or fever.       No Known Allergies   Current Outpatient Medications:  .  aspirin EC 81 MG EC tablet, Take 1 tablet (81 mg total) by mouth daily. (Patient taking differently: Take 81 mg by mouth at bedtime. ), Disp: , Rfl:  .  atorvastatin (LIPITOR) 80 MG tablet, TAKE 1 TABLET BY MOUTH EVERY DAY, Disp: 90 tablet, Rfl: 1 .  metoprolol tartrate (LOPRESSOR) 25 MG tablet, TAKE 1 TABLET BY MOUTH TWICE A DAY (Patient taking differently: TAKE 1 TABLET BY MOUTH once per day), Disp: 180 tablet, Rfl: 1 .  nitroGLYCERIN (NITROSTAT) 0.4 MG SL tablet, Place 1 tablet (0.4 mg total) under the tongue every 5 (five) minutes as needed for chest pain., Disp: 25 tablet, Rfl: 12 .  omeprazole (PRILOSEC) 20 MG capsule, Take 20 mg by mouth daily. , Disp: , Rfl:  .  pantoprazole (PROTONIX) 40 MG tablet, TAKE 1 TABLET BY MOUTH EVERY DAY, Disp: 90 tablet, Rfl: 1 .  rivaroxaban (XARELTO) 20 MG TABS tablet, Take 1 tablet (20 mg total) by mouth daily with supper. Start after completion of 21 day starter pack., Disp: 30 tablet, Rfl: 3 .  sertraline (ZOLOFT) 100 MG tablet, Take 0.5 tablets (50 mg total) by mouth at bedtime as needed., Disp: 90 tablet, Rfl: 4 .  valsartan (DIOVAN) 80 MG tablet, TAKE 1 TABLET BY MOUTH EVERY DAY, Disp: 90 tablet, Rfl: 1 .  clopidogrel (PLAVIX) 75 MG tablet, TAKE 1 TABLET BY  MOUTH EVERY DAY (Patient not taking: Reported on 10/22/2017), Disp: 90 tablet, Rfl: 1  Review of Systems  Constitutional: Negative for appetite change, chills and fever.  Respiratory: Negative for chest tightness, shortness of breath and wheezing.   Cardiovascular: Negative for chest pain and palpitations.  Gastrointestinal: Negative for abdominal pain, nausea and vomiting.  Musculoskeletal: Positive for back pain.  Neurological: Positive for tingling (in right buttocks) and numbness (in right buttocks).    Social History   Tobacco Use  . Smoking status: Never Smoker  . Smokeless tobacco: Former Systems developer    Types: Chew  Substance Use Topics  . Alcohol use: No    Alcohol/week: 1.0 standard drinks    Types: 1 Standard drinks or equivalent per week    Comment: social drinker   Objective:   BP (!) 144/87 (BP Location: Left Arm, Patient Position: Sitting, Cuff Size: Large)   Pulse 69   Temp 98.7 F (37.1 C) (Oral)   Resp 16   Wt 282 lb (127.9 kg)   SpO2 98% Comment: room air  BMI 39.33 kg/m  Vitals:   10/22/17 1449  BP: (!) 144/87  Pulse: 69  Resp: 16  Temp: 98.7 F (37.1 C)  TempSrc: Oral  SpO2: 98%  Weight: 282 lb (127.9 kg)     Physical Exam  General  appearance: alert, well developed, well nourished, cooperative and in no distress Head: Normocephalic, without obvious abnormality, atraumatic Respiratory: Respirations even and unlabored, normal respiratory rate MS: Tender lumbar spine and right para lumbar muscles, pain in leg reproduced by palpation of lumbar spine. Neurologic: Mental status: Alert, oriented to person, place, and time, thought content appropriate.     Assessment & Plan:     1. Sciatica of right side  - cyclobenzaprine (FLEXERIL) 5 MG tablet; Take 1-2 tablets (5-10 mg total) by mouth 3 (three) times daily as needed for muscle spasms.  Dispense: 30 tablet; Refill: 0 - predniSONE (DELTASONE) 10 MG tablet; 6 tablets for 1 day, then 5 for 1 day, then 4  for 1 day, then 3 for 1 day, then 2 for 1 day then 1 for 1 day.  Dispense: 21 tablet; Refill: 0  Call if symptoms change or if not rapidly improving.         Lelon Huh, MD  [facility]

## 2017-11-07 ENCOUNTER — Other Ambulatory Visit: Payer: Self-pay | Admitting: Oncology

## 2017-11-09 NOTE — Progress Notes (Signed)
[place]  [phone] [phone]  ID: Calton Golds OB: [birth year]  MR#: [id number]  QBH#:[id number]  Patient Care Team: Birdie Sons, MD as PCP - General (Family Medicine) Burnis Kingfisher, MD as Referring Physician (Orthopedic Surgery) Beverly Gust, MD (Unknown Physician Specialty) Troy Sine, MD as Consulting Physician (Cardiology)  CHIEF COMPLAINT: Left leg DVT.  INTERVAL HISTORY: Patient returns to clinic today for further evaluation, discussion of his laboratory work and whether or not to continue his anticoagulation.  He currently feels well and is asymptomatic.  His leg pain has resolved. He has no neurologic complaints.  He denies any recent fevers or illnesses.  He has a good appetite and denies weight loss.  He has no chest pain, cough, hemoptysis, or shortness of breath.  He denies any nausea, vomiting, constipation, or diarrhea.  He has no urinary complaints.  Patient feels at his baseline offers no specific complaints today.  REVIEW OF SYSTEMS:   Review of Systems  Constitutional: Negative.  Negative for fever, malaise/fatigue and weight loss.  Respiratory: Negative.  Negative for cough, hemoptysis and shortness of breath.   Cardiovascular: Negative.  Negative for chest pain and leg swelling.  Gastrointestinal: Negative.  Negative for abdominal pain.  Genitourinary: Negative.  Negative for dysuria.  Musculoskeletal: Negative.  Negative for back pain.  Skin: Negative.  Negative for rash.  Neurological: Negative.  Negative for sensory change, focal weakness, weakness and headaches.  Psychiatric/Behavioral: Negative.  The patient is not nervous/anxious.     As per HPI. Otherwise, a complete review of systems is negative.  PAST MEDICAL HISTORY: Past Medical History:  Diagnosis Date  . CAD (coronary artery disease)    a. 07/2014 Inf STEMI/PTCA:  LM nl, LAD nl, LCX nl, RCA ulcerated plaque (PTCA only);  b. 10/2014 MV:  intermediate risk, ? inflat isch/scar;  c. 10/2014 Cath: Nl cors w/o RCA restenosis.  . Diastolic dysfunction    a. 07/2014 Echo: EF 60-65%, Gr1 DD.  . DVT of lower extremity (deep venous thrombosis) ([place])    a. ~ 2009, per pt was on lovenox/coumadin  . GERD (gastroesophageal reflux disease)   . Hyperlipidemia   . Hypertension   . Hypertensive heart disease   . TIA (transient ischemic attack) 07/23/2014    PAST SURGICAL HISTORY: Past Surgical History:  Procedure Laterality Date  . ARTHROPLASTY Right 07/09/2010  . CARDIAC CATHETERIZATION N/A 07/23/2014   Procedure: Left Heart Cath;  Surgeon: Troy Sine, MD;  Location: [facility];  Service: Cardiovascular;  Laterality: N/A;  . CARDIAC CATHETERIZATION N/A 07/23/2014   Procedure: Coronary Balloon Angioplasty;  Surgeon: Troy Sine, MD;  Location: [facility];  Service: Cardiovascular;  Laterality: N/A;  . CARDIAC CATHETERIZATION N/A 11/10/2014   Procedure: Left Heart Cath and Coronary Angiography;  Surgeon: Troy Sine, MD;  Location: [place] [facility];  Service: Cardiovascular;  Laterality: N/A;  . CERVICAL [place] SURGERY  2015  . JOINT REPLACEMENT    . MENISCUS REPAIR Right 11/03/1995   Meniscus Lateral Tear: repaired by Dr. Sabra Heck at [facility]  . NASAL SINUS SURGERY     Deviated Septum  . REPLACEMENT TOTAL KNEE Right 2011   [facility]    FAMILY HISTORY: Family History  Problem Relation Age of Onset  . Hypertension Mother   . Arthritis Mother   . Diverticulitis Mother   . Gout Father   . Arthritis Sister   . Diabetes Paternal Grandfather   .  Diabetes Maternal Grandfather   . Cancer Maternal Grandfather        unknown    ADVANCED DIRECTIVES (Y/N):  N  HEALTH MAINTENANCE: Social History   Tobacco Use  . Smoking status: Never Smoker  . Smokeless tobacco: Former Systems developer    Types: Chew  Substance Use Topics  . Alcohol use: No    Alcohol/week: 1.0 standard drinks    Types: 1  Standard drinks or equivalent per week    Comment: social drinker  . Drug use: No     Colonoscopy:  PAP:  Bone density:  Lipid panel:  No Known Allergies  Current Outpatient Medications  Medication Sig Dispense Refill  . aspirin EC 81 MG EC tablet Take 1 tablet (81 mg total) by mouth daily. (Patient taking differently: Take 81 mg by mouth at bedtime. )    . atorvastatin (LIPITOR) 80 MG tablet TAKE 1 TABLET BY MOUTH EVERY DAY 90 tablet 1  . clopidogrel (PLAVIX) 75 MG tablet TAKE 1 TABLET BY MOUTH EVERY DAY 90 tablet 1  . metoprolol tartrate (LOPRESSOR) 25 MG tablet TAKE 1 TABLET BY MOUTH TWICE A DAY (Patient taking differently: TAKE 1 TABLET BY MOUTH once per day) 180 tablet 1  . omeprazole (PRILOSEC) 20 MG capsule Take 20 mg by mouth daily.     . pantoprazole (PROTONIX) 40 MG tablet TAKE 1 TABLET BY MOUTH EVERY DAY 90 tablet 1  . sertraline (ZOLOFT) 100 MG tablet Take 0.5 tablets (50 mg total) by mouth at bedtime as needed. 90 tablet 4  . valsartan (DIOVAN) 80 MG tablet TAKE 1 TABLET BY MOUTH EVERY DAY 90 tablet 1  . XARELTO 20 MG TABS tablet TAKE 1 TABLET BY MOUTH DAILY WITH SUPPER. START AFTER COMPLETION OF 21 DAY STARTER PACK. 90 tablet 1  . ALPRAZolam (XANAX) 0.25 MG tablet Take 0.25 mg by mouth at bedtime.     . cyclobenzaprine (FLEXERIL) 5 MG tablet Take 1-2 tablets (5-10 mg total) by mouth 3 (three) times daily as needed for muscle spasms. (Patient not taking: Reported on 11/11/2017) 30 tablet 0  . fluticasone (FLONASE) 50 MCG/ACT nasal spray fluticasone propionate 50 mcg/actuation nasal spray,suspension    . gabapentin (NEURONTIN) 300 MG capsule gabapentin 300 mg capsule    . nitroGLYCERIN (NITROSTAT) 0.4 MG SL tablet Place 1 tablet (0.4 mg total) under the tongue every 5 (five) minutes as needed for chest pain. (Patient not taking: Reported on 11/11/2017) 25 tablet 12   No current facility-administered medications for this visit.     OBJECTIVE: Vitals:   11/11/17 1419  BP:  (!) 149/93  Pulse: 78  Resp: 18  Temp: 98.2 F (36.8 C)     Body mass index is 38.51 kg/m.    ECOG FS:0 - Asymptomatic  General: Well-developed, well-nourished, no acute distress. Eyes: Pink conjunctiva, anicteric sclera. HEENT: Normocephalic, moist mucous membranes. Lungs: Clear to auscultation bilaterally. Heart: Regular rate and rhythm. No rubs, murmurs, or gallops. Abdomen: Soft, nontender, nondistended. No organomegaly noted, normoactive bowel sounds. Musculoskeletal: No edema, cyanosis, or clubbing. Neuro: Alert, answering all questions appropriately. Cranial nerves grossly intact. Skin: No rashes or petechiae noted. Psych: Normal affect.  LAB RESULTS:  Lab Results  Component Value Date   NA 140 09/19/2017   K 4.5 09/19/2017   CL 102 09/19/2017   CO2 24 09/19/2017   GLUCOSE 85 09/19/2017   BUN 15 09/19/2017   CREATININE 1.09 09/19/2017   CALCIUM 9.8 09/19/2017   PROT 7.0 09/19/2017   ALBUMIN  4.4 09/19/2017   AST 28 09/19/2017   ALT 46 (H) 09/19/2017   ALKPHOS 77 09/19/2017   BILITOT 0.2 09/19/2017   GFRNONAA 80 09/19/2017   GFRAA 93 09/19/2017    Lab Results  Component Value Date   WBC 8.0 09/19/2017   NEUTROABS 4.0 09/19/2015   HGB 14.4 09/19/2017   HCT 44.8 09/19/2017   MCV 91 09/19/2017   PLT 321 09/19/2017     STUDIES: No results found.  ASSESSMENT: Left leg DVT  PLAN:  1. Left leg DVT: Possibly secondary to sedentary lifestyle, particularly at work.  Ultrasound results from July 31, 2017 reviewed independently with no evidence of DVT above the knee.  Typically, a below the knee DVT does not require anticoagulation, but since this was patient's second DVT and he was symptomatic with pain at the time 3 months of Xarelto was recommended.  Full hypercoagulable work-up, including factor V Leiden and prothrombin gene mutation, was either negative or within normal limits.  No further intervention is needed.  Patient has been instructed to complete his  current prescription of Xarelto and then discontinue.  He does not require chronic anticoagulation at this time.  Patient expressed understanding that if he has another DVT, would consider lifelong treatment.  We reviewed extensively the risk factors for developing DVT including but not limited to surgery and sedentary lifestyle.  No further follow-up has been scheduled.  Please refer patient back if there are any questions or concerns.    I spent a total of 30 minutes face-to-face with the patient of which greater than 50% of the visit was spent in counseling and coordination of care as detailed above.  Patient expressed understanding and was in agreement with this plan. He also understands that He can call clinic at any time with any questions, concerns, or complaints.    Lloyd Huger, MD   11/15/2017 7:12 AM

## 2017-11-11 ENCOUNTER — Inpatient Hospital Stay: Payer: BLUE CROSS/BLUE SHIELD | Attending: Oncology | Admitting: Oncology

## 2017-11-11 ENCOUNTER — Other Ambulatory Visit: Payer: Self-pay

## 2017-11-11 VITALS — BP 149/93 | HR 78 | Temp 98.2°F | Resp 18 | Wt 276.1 lb

## 2017-11-11 DIAGNOSIS — Z7982 Long term (current) use of aspirin: Secondary | ICD-10-CM | POA: Diagnosis not present

## 2017-11-11 DIAGNOSIS — Z7901 Long term (current) use of anticoagulants: Secondary | ICD-10-CM | POA: Diagnosis not present

## 2017-11-11 DIAGNOSIS — Z809 Family history of malignant neoplasm, unspecified: Secondary | ICD-10-CM | POA: Insufficient documentation

## 2017-11-11 DIAGNOSIS — Z87891 Personal history of nicotine dependence: Secondary | ICD-10-CM | POA: Insufficient documentation

## 2017-11-11 DIAGNOSIS — Z79899 Other long term (current) drug therapy: Secondary | ICD-10-CM | POA: Diagnosis not present

## 2017-11-11 DIAGNOSIS — E785 Hyperlipidemia, unspecified: Secondary | ICD-10-CM | POA: Diagnosis not present

## 2017-11-11 DIAGNOSIS — Z86718 Personal history of other venous thrombosis and embolism: Secondary | ICD-10-CM | POA: Diagnosis not present

## 2017-11-11 DIAGNOSIS — I2510 Atherosclerotic heart disease of native coronary artery without angina pectoris: Secondary | ICD-10-CM | POA: Diagnosis not present

## 2017-11-11 DIAGNOSIS — I82442 Acute embolism and thrombosis of left tibial vein: Secondary | ICD-10-CM | POA: Diagnosis not present

## 2017-11-11 DIAGNOSIS — K219 Gastro-esophageal reflux disease without esophagitis: Secondary | ICD-10-CM | POA: Diagnosis not present

## 2017-11-11 DIAGNOSIS — Z8673 Personal history of transient ischemic attack (TIA), and cerebral infarction without residual deficits: Secondary | ICD-10-CM | POA: Diagnosis not present

## 2017-11-11 NOTE — Progress Notes (Signed)
Here for follow up. Pt c/o" constant tired and pain "

## 2017-11-17 ENCOUNTER — Other Ambulatory Visit: Payer: Self-pay | Admitting: Family Medicine

## 2017-11-17 MED ORDER — SERTRALINE HCL 100 MG PO TABS: 50.0000 mg | ORAL_TABLET | Freq: Every evening | ORAL | 4 refills | Status: DC | PRN

## 2017-11-17 NOTE — Telephone Encounter (Signed)
Pt needing a refill on:  sertraline (ZOLOFT) 100 MG tablet   Please refill at:  [id number] - [place], [place] - [address] [phone] (Phone) [phone] (Fax)    Thanks, [employer]

## 2018-01-02 ENCOUNTER — Other Ambulatory Visit: Payer: Self-pay | Admitting: Family Medicine

## 2018-01-02 MED ORDER — SERTRALINE HCL 100 MG PO TABS: 100.0000 mg | ORAL_TABLET | Freq: Every day | ORAL | 3 refills | Status: DC

## 2018-01-08 ENCOUNTER — Other Ambulatory Visit: Payer: Self-pay | Admitting: Cardiovascular Disease

## 2018-01-14 ENCOUNTER — Other Ambulatory Visit: Payer: Self-pay | Admitting: Cardiovascular Disease

## 2018-03-15 ENCOUNTER — Other Ambulatory Visit: Payer: Self-pay | Admitting: Cardiovascular Disease

## 2018-03-17 ENCOUNTER — Other Ambulatory Visit: Payer: Self-pay | Admitting: Cardiovascular Disease

## 2018-03-17 MED ORDER — CLOPIDOGREL BISULFATE 75 MG PO TABS: 75.0000 mg | ORAL_TABLET | Freq: Every day | ORAL | 0 refills | Status: DC

## 2018-03-17 MED ORDER — PANTOPRAZOLE SODIUM 40 MG PO TBEC: 40.0000 mg | DELAYED_RELEASE_TABLET | Freq: Every day | ORAL | 0 refills | Status: DC

## 2018-03-17 NOTE — Telephone Encounter (Signed)
 *  STAT* If patient is at the pharmacy, call can be transferred to refill team.   1. Which medications need to be refilled? (please list name of each medication and dose if known) clopidogrel (PLAVIX) 75 MG tablet pantoprazole (PROTONIX) 40 MG tablet  2. Which pharmacy/location (including street and city if local pharmacy) [facility]   3. Do they need a 30 day or 90 day supply? 30  Patient has appt with Dr Claiborne Billings on 04/03/18

## 2018-03-18 ENCOUNTER — Telehealth: Payer: Self-pay | Admitting: Cardiovascular Disease

## 2018-03-18 ENCOUNTER — Other Ambulatory Visit: Payer: Self-pay

## 2018-03-18 NOTE — Telephone Encounter (Signed)
See other tel enc for the same date.  Pt is past due for f/u. Last seen on Sept 2018. Pt Plavix and Protonix was refilled on 03/17/18 for 30day supply. Pt has scheduled an appt for 04/03/18 with Dr.Kelly. Pt sts that his insurance will only cover 90day supply. Pt sts that he was told that all of his medications are on hold at the pharmacy. Adv pt to contact his pharmacy for information on why his meds are on hold. Adv pt that I will need to send a message to [facility] and his nurse Almyra Free to approve his rqst for 90day supply.

## 2018-03-18 NOTE — Telephone Encounter (Signed)
New Message  .          *STAT* If patient is at the pharmacy, call can be transferred to refill team.   1. Which medications need to be refilled? (please list name of each medication and dose if known) Plavix/Pantoprazole  2. Which pharmacy/location (including street and city if local pharmacy) is medication [facility] sent to[facility]  3. Do they need a 30 day or 90 day supply?[place]

## 2018-03-18 NOTE — Telephone Encounter (Signed)
Spoke with pt wife. Adv her that both Plavix and Protonix was refilled on 03/17/18 with confirmation  Recommended that they f/u with the Pharmacy to see when they are able to fill the script. Pt wife verbalized understanding.

## 2018-03-19 MED ORDER — CLOPIDOGREL BISULFATE 75 MG PO TABS: 75.0000 mg | ORAL_TABLET | Freq: Every day | ORAL | 0 refills | Status: DC

## 2018-03-19 MED ORDER — PANTOPRAZOLE SODIUM 40 MG PO TBEC: 40.0000 mg | DELAYED_RELEASE_TABLET | Freq: Every day | ORAL | 0 refills | Status: DC

## 2018-03-19 NOTE — Telephone Encounter (Signed)
Submitted the 90 day supply, but notified pharmacy that no more refills will be given until patient is seen. Must keep OV with Dr.Kelly.   Patient was called and notified scripts were sent, and advised to keep that follow up appointment.  Patient verbalized understanding, states he will be at that appointment.

## 2018-03-28 DIAGNOSIS — Z23 Encounter for immunization: Secondary | ICD-10-CM | POA: Diagnosis not present

## 2018-04-01 DIAGNOSIS — J111 Influenza due to unidentified influenza virus with other respiratory manifestations: Secondary | ICD-10-CM | POA: Diagnosis not present

## 2018-04-03 ENCOUNTER — Ambulatory Visit: Payer: BLUE CROSS/BLUE SHIELD | Admitting: Cardiovascular Disease

## 2018-04-07 ENCOUNTER — Other Ambulatory Visit: Payer: Self-pay | Admitting: Cardiovascular Disease

## 2018-04-17 ENCOUNTER — Ambulatory Visit (INDEPENDENT_AMBULATORY_CARE_PROVIDER_SITE_OTHER): Payer: BLUE CROSS/BLUE SHIELD | Admitting: Physician Assistant

## 2018-04-17 VITALS — BP 140/90 | HR 63 | Ht 70.0 in | Wt 289.2 lb

## 2018-04-17 DIAGNOSIS — I824Y9 Acute embolism and thrombosis of unspecified deep veins of unspecified proximal lower extremity: Secondary | ICD-10-CM

## 2018-04-17 DIAGNOSIS — I10 Essential (primary) hypertension: Secondary | ICD-10-CM

## 2018-04-17 DIAGNOSIS — I2510 Atherosclerotic heart disease of native coronary artery without angina pectoris: Secondary | ICD-10-CM

## 2018-04-17 DIAGNOSIS — M79604 Pain in right leg: Secondary | ICD-10-CM | POA: Diagnosis not present

## 2018-04-17 DIAGNOSIS — E785 Hyperlipidemia, unspecified: Secondary | ICD-10-CM

## 2018-04-17 DIAGNOSIS — M79605 Pain in left leg: Secondary | ICD-10-CM

## 2018-04-17 MED ORDER — VALSARTAN 160 MG PO TABS: 160.0000 mg | ORAL_TABLET | Freq: Every day | ORAL | 3 refills | Status: DC

## 2018-04-17 NOTE — Patient Instructions (Addendum)
Medication Instructions:  Your physician has recommended you make the following change in your medication:  1.  INCREASE the Valsartan to 160 mg taking 1 tablet daily   If you need a refill on your cardiac medications before your next appointment, please call your pharmacy.   Lab work: 1 WEEK:  BMET  If you have labs (blood work) drawn today and your tests are completely normal, you will receive your results only by: Marland Kitchen MyChart Message (if you have MyChart) OR . A paper copy in the mail If you have any lab test that is abnormal or we need to change your treatment, we will call you to review the results.  Testing/Procedures: Your physician has requested that you have a lower extremity arterial exercise duplex. During this test, exercise and ultrasound are used to evaluate arterial blood flow in the legs. Allow one hour for this exam. There are no restrictions or special instructions.   Follow-Up: At [facility], you and your health needs are our priority.  As part of our continuing mission to provide you with exceptional heart care, we have created designated Provider Care Teams.  These Care Teams include your primary Cardiologist (physician) and Advanced Practice Providers (APPs -  Physician Assistants and Nurse Practitioners) who all work together to provide you with the care you need, when you need it. You will need a follow up appointment in 12 months.  Please call our office 2 months in advance to schedule this appointment.  You may see Dr. Claiborne Billings  or one of the following Advanced Practice Providers on your designated Care Team: [place], [place] . Fabian Sharp, PA-C  Any Other Special Instructions Will Be Listed Below (If Applicable).

## 2018-04-17 NOTE — Progress Notes (Signed)
Cardiology Office Note    Date:  04/19/2018   ID:  Eduardo Poole, Eduardo Poole [birth year], MRN [id number]  PCP:  Eduardo Sons, MD  Cardiologist:  Dr. Claiborne Poole   Chief Complaint  Patient presents with  . Follow-up    seen for Dr. Claiborne Poole.     History of Present Illness:  Eduardo Poole is a 48 y.o. male with past medical history of hypertension, hyperlipidemia, history of TIA, OSA, DVT and CAD.  Patient had a inferior STEMI in June 2016.  Cardiac catheterization at the time showed ulcerated RCA plaque treated with PTCA, otherwise normal coronary arteries in other vessels.  Myoview in September 2016 was intermediate risk with inferolateral ischemia versus scar.  He underwent cardiac catheterization again in September 2016 which showed normal coronaries without RCA restenosis.  He also suffered a very transient TIA symptoms in the past.  Patient was last seen by Dr. Claiborne Poole in September 2018, at which time he was doing well.  He was diagnosed with DVT based on venous Doppler on 07/31/2017 which revealed isolated calf DVT involving the posterior tibial vein.  Hypercoagulable panel was ordered which came back negative.  He was placed on 21-month course of Xarelto then stop afterward.  He has been followed by hematology/oncology service and was told that if he was to have another episode of DVT, he will need lifelong treatment.  Patient presents today for cardiology follow-up.  He has been off of Xarelto at this point.  He continued to have intermittent leg cramps worse with ambulation.  On physical exam, I can feel the dorsalis pedis pulse, however I had a difficult time feeling the posterior tibial pulse.  I will obtain ABI to rule out PAD.  Otherwise his blood pressure has been elevated in the 140s recently, I will increase his valsartan to 160 mg daily.  He will need 1 week basic metabolic panel.  He can follow-up with Dr. Claiborne Poole when 1 year.  Last lipid panel obtained in August 2019 showed very  well-controlled cholesterol, low HDL, normal LDL, and normal triglyceride.   Past Medical History:  Diagnosis Date  . CAD (coronary artery disease)    a. 07/2014 Inf STEMI/PTCA:  LM nl, LAD nl, LCX nl, RCA ulcerated plaque (PTCA only);  b. 10/2014 MV: intermediate risk, ? inflat isch/scar;  c. 10/2014 Cath: Nl cors w/o RCA restenosis.  . Diastolic dysfunction    a. 07/2014 Echo: EF 60-65%, Gr1 DD.  . DVT of lower extremity (deep venous thrombosis) ([place])    a. ~ 2009, per pt was on lovenox/coumadin  . GERD (gastroesophageal reflux disease)   . Hyperlipidemia   . Hypertension   . Hypertensive heart disease   . TIA (transient ischemic attack) 07/23/2014    Past Surgical History:  Procedure Laterality Date  . ARTHROPLASTY Right 07/09/2010  . CARDIAC CATHETERIZATION N/A 07/23/2014   Procedure: Left Heart Cath;  Surgeon: Eduardo Sine, MD;  Location: [facility];  Service: Cardiovascular;  Laterality: N/A;  . CARDIAC CATHETERIZATION N/A 07/23/2014   Procedure: Coronary Balloon Angioplasty;  Surgeon: Eduardo Sine, MD;  Location: [facility];  Service: Cardiovascular;  Laterality: N/A;  . CARDIAC CATHETERIZATION N/A 11/10/2014   Procedure: Left Heart Cath and Coronary Angiography;  Surgeon: Eduardo Sine, MD;  Location: [facility];  Service: Cardiovascular;  Laterality: N/A;  . CERVICAL [place] SURGERY  2015  . JOINT REPLACEMENT    . MENISCUS REPAIR Right 11/03/1995  Meniscus Lateral Tear: repaired by Dr. Sabra Poole at [facility]  . NASAL SINUS SURGERY     Deviated Septum  . REPLACEMENT TOTAL KNEE Right 2011   [facility]    Current Medications: Outpatient Medications Prior to Visit  Medication Sig Dispense Refill  . aspirin EC 81 MG EC tablet Take 1 tablet (81 mg total) by mouth daily. (Patient taking differently: Take 81 mg by mouth at bedtime. )    . atorvastatin (LIPITOR) 80 MG tablet TAKE 1 TABLET (80 MG TOTAL) BY MOUTH DAILY. NEED OV. 90 tablet  0  . clopidogrel (PLAVIX) 75 MG tablet Take 1 tablet (75 mg total) by mouth daily. KEEP OV for further refills. 90 tablet 0  . sertraline (ZOLOFT) 100 MG tablet Take 1 tablet (100 mg total) by mouth daily. 90 tablet 3  . valsartan (DIOVAN) 80 MG tablet TAKE 1 TABLET BY MOUTH EVERY DAY 90 tablet 0  . metoprolol tartrate (LOPRESSOR) 25 MG tablet TAKE 1 TABLET (25 MG TOTAL) BY MOUTH 2 (TWO) TIMES DAILY. NEED OV. 180 tablet 0  . nitroGLYCERIN (NITROSTAT) 0.4 MG SL tablet Place 1 tablet (0.4 mg total) under the tongue every 5 (five) minutes as needed for chest pain. (Patient not taking: Reported on 11/11/2017) 25 tablet 12  . ALPRAZolam (XANAX) 0.25 MG tablet Take 0.25 mg by mouth at bedtime.     . cyclobenzaprine (FLEXERIL) 5 MG tablet Take 1-2 tablets (5-10 mg total) by mouth 3 (three) times daily as needed for muscle spasms. (Patient not taking: Reported on 11/11/2017) 30 tablet 0  . fluticasone (FLONASE) 50 MCG/ACT nasal spray fluticasone propionate 50 mcg/actuation nasal spray,suspension    . gabapentin (NEURONTIN) 300 MG capsule gabapentin 300 mg capsule    . omeprazole (PRILOSEC) 20 MG capsule Take 20 mg by mouth daily.     . pantoprazole (PROTONIX) 40 MG tablet Take 1 tablet (40 mg total) by mouth daily. KEEP OV for any further refills. 90 tablet 0  . XARELTO 20 MG TABS tablet TAKE 1 TABLET BY MOUTH DAILY WITH SUPPER. START AFTER COMPLETION OF 21 DAY STARTER PACK. 90 tablet 1   No facility-administered medications prior to visit.      Allergies:   Patient has no known allergies.   Social History   Socioeconomic History  . Marital status: Married    Spouse name: Not on file  . Number of children: 2  . Years of education: 83  . Highest education level: Not on file  Occupational History  . Occupation: Employed    Comment: Works at [employer]  . Financial resource strain: Not on file  . Food insecurity:    Worry: Not on file    Inability: Not on file  .  Transportation needs:    Medical: Not on file    Non-medical: Not on file  Tobacco Use  . Smoking status: Never Smoker  . Smokeless tobacco: Former Systems developer    Types: Chew  Substance and Sexual Activity  . Alcohol use: No    Alcohol/week: 1.0 standard drinks    Types: 1 Standard drinks or equivalent per week    Comment: social drinker  . Drug use: No  . Sexual activity: Yes  Lifestyle  . Physical activity:    Days per week: Not on file    Minutes per session: Not on file  . Stress: Not on file  Relationships  . Social connections:    Talks on phone: Not on  file    Gets together: Not on file    Attends religious service: Not on file    Active member of club or organization: Not on file    Attends meetings of clubs or organizations: Not on file    Relationship status: Not on file  Other Topics Concern  . Not on file  Social History Narrative  . Not on file     Family History:  The patient's family history includes Arthritis in his mother and sister; Cancer in his maternal grandfather; Diabetes in his maternal grandfather and paternal grandfather; Diverticulitis in his mother; Gout in his father; Hypertension in his mother.   ROS:   Please see the history of present illness.    ROS All other systems reviewed and are negative.   PHYSICAL EXAM:   VS:  BP 140/90   Pulse 63   Ht 5\' 10"  (1.778 m)   Wt 289 lb 3.2 oz (131.2 kg)   BMI 41.50 kg/m    GEN: Well nourished, well developed, in no acute distress  HEENT: normal  Neck: no JVD, carotid bruits, or masses Cardiac: RRR; no murmurs, rubs, or gallops,no edema  Respiratory:  clear to auscultation bilaterally, normal work of breathing GI: soft, nontender, nondistended, + BS MS: no deformity or atrophy  Skin: warm and dry, no rash Neuro:  Alert and Oriented x 3, Strength and sensation are intact Psych: euthymic mood, full affect  Wt Readings from Last 3 Encounters:  04/17/18 289 lb 3.2 oz (131.2 kg)  11/11/17 276 lb 1.6 oz  (125.2 kg)  10/22/17 282 lb (127.9 kg)      Studies/Labs Reviewed:   EKG:  EKG is ordered today.  The ekg ordered today demonstrates NSR with nonspecific T wave changes  Recent Labs: 09/19/2017: ALT 46; BUN 15; Creatinine, Ser 1.09; Hemoglobin 14.4; Platelets 321; Potassium 4.5; Sodium 140   Lipid Panel    Component Value Date/Time   CHOL 113 09/19/2017 1445   TRIG 153 (H) 09/19/2017 1445   HDL 27 (L) 09/19/2017 1445   CHOLHDL 4.2 09/19/2017 1445   CHOLHDL 3.9 02/06/2016 1058   VLDL 30 02/06/2016 1058   LDLCALC 55 09/19/2017 1445    Additional studies/ records that were reviewed today include:   Echo 07/25/2014 LV EF: 60% -   65%  ------------------------------------------------------------------- Indications:      Chest pain 786.51.  ------------------------------------------------------------------- History:   Risk factors:  Current tobacco use. Hypertension.  ------------------------------------------------------------------- Study Conclusions  - Left ventricle: The cavity size was normal. Wall thickness was   increased in a pattern of mild LVH. Systolic function was normal.   The estimated ejection fraction was in the range of 60% to 65%.   Wall motion was normal; there were no regional wall motion   abnormalities. Doppler parameters are consistent with abnormal   left ventricular relaxation (grade 1 diastolic dysfunction). The   E/e&' ratio is around 8, suggesting borderline increased LV   filling pressure. - Mitral valve: Structurally normal valve. There was trivial   regurgitation. - Left atrium: The atrium was normal in size.  Impressions:  - LVEF 60-65%, mild LVH, diastolic dysfunction with borderline   elevated LV filling pressure, normal LA size.   ASSESSMENT:    1. Coronary artery disease involving native coronary artery of native heart without angina pectoris   2. Essential hypertension   3. Hyperlipidemia LDL goal <70   4. Pain in both  lower extremities   5. Deep vein thrombosis (DVT)  of proximal lower extremity, unspecified chronicity, unspecified laterality ([place])      PLAN:  In order of problems listed above:  1. CAD: Denies any recent chest pain or shortness of breath.  Continue aspirin, Plavix and statin  2. Hypertension: Blood pressure mildly elevated, increase valsartan to 160 mg daily.  Obtain basic metabolic panel in 1 week  3. Hyperlipidemia: Last lipid panel obtained the August 2019 showed very well-controlled cholesterol.  Continue Lipitor 80 mg daily.  4. Bilateral lower extremity: Obtain ABI his lower extremity pain is worse with ambulation  5. History of DVT: Completed a course of Xarelto.  Currently off of systemic anticoagulation.    Medication Adjustments/Labs and Tests Ordered: Current medicines are reviewed at length with the patient today.  Concerns regarding medicines are outlined above.  Medication changes, Labs and Tests ordered today are listed in the Patient Instructions below. Patient Instructions  Medication Instructions:  Your physician has recommended you make the following change in your medication:  1.  INCREASE the Valsartan to 160 mg taking 1 tablet daily   If you need a refill on your cardiac medications before your next appointment, please call your pharmacy.   Lab work: 1 WEEK:  BMET  If you have labs (blood work) drawn today and your tests are completely normal, you will receive your results only by: Marland Kitchen MyChart Message (if you have MyChart) OR . A paper copy in the mail If you have any lab test that is abnormal or we need to change your treatment, we will call you to review the results.  Testing/Procedures: Your physician has requested that you have a lower extremity arterial exercise duplex. During this test, exercise and ultrasound are used to evaluate arterial blood flow in the legs. Allow one hour for this exam. There are no restrictions or special  instructions.   Follow-Up: At [facility], you and your health needs are our priority.  As part of our continuing mission to provide you with exceptional heart care, we have created designated Provider Care Teams.  These Care Teams include your primary Cardiologist (physician) and Advanced Practice Providers (APPs -  Physician Assistants and Nurse Practitioners) who all work together to provide you with the care you need, when you need it. You will need a follow up appointment in 12 months.  Please call our office 2 months in advance to schedule this appointment.  You may see Dr. Claiborne Poole  or one of the following Advanced Practice Providers on your designated Care Team: [place], [place] . Fabian Sharp, PA-C  Any Other Special Instructions Will Be Listed Below (If Applicable).       Eduardo Poole, [place]  04/19/2018 10:38 PM    [facility] [address] [place], [place], [place]  [address] Phone: [phone]; Fax: [phone]

## 2018-04-19 ENCOUNTER — Encounter: Payer: Self-pay | Admitting: Physician Assistant

## 2018-05-05 ENCOUNTER — Other Ambulatory Visit: Payer: Self-pay | Admitting: Physician Assistant

## 2018-05-05 DIAGNOSIS — I739 Peripheral vascular disease, unspecified: Secondary | ICD-10-CM

## 2018-05-14 ENCOUNTER — Ambulatory Visit (HOSPITAL_COMMUNITY): Payer: BLUE CROSS/BLUE SHIELD

## 2018-05-22 DIAGNOSIS — G4733 Obstructive sleep apnea (adult) (pediatric): Secondary | ICD-10-CM | POA: Diagnosis not present

## 2018-06-10 ENCOUNTER — Other Ambulatory Visit: Payer: Self-pay | Admitting: Cardiovascular Disease

## 2018-06-12 ENCOUNTER — Encounter (HOSPITAL_COMMUNITY): Payer: BLUE CROSS/BLUE SHIELD

## 2018-07-02 ENCOUNTER — Other Ambulatory Visit: Payer: Self-pay | Admitting: Cardiovascular Disease

## 2018-07-09 ENCOUNTER — Other Ambulatory Visit: Payer: Self-pay

## 2018-07-09 ENCOUNTER — Ambulatory Visit (HOSPITAL_COMMUNITY)
Admission: RE | Admit: 2018-07-09 | Discharge: 2018-07-09 | Disposition: A | Discharge status: Home / Self Care | Payer: BLUE CROSS/BLUE SHIELD | Source: Ambulatory Visit | Attending: Cardiology | Admitting: Cardiology

## 2018-07-09 DIAGNOSIS — I739 Peripheral vascular disease, unspecified: Secondary | ICD-10-CM | POA: Diagnosis not present

## 2018-08-31 ENCOUNTER — Other Ambulatory Visit: Payer: Self-pay | Admitting: Physician Assistant

## 2018-09-29 ENCOUNTER — Encounter: Payer: BLUE CROSS/BLUE SHIELD | Admitting: Family Medicine

## 2018-10-05 ENCOUNTER — Encounter: Payer: Self-pay | Admitting: Family Medicine

## 2018-10-05 ENCOUNTER — Other Ambulatory Visit: Payer: Self-pay

## 2018-10-05 ENCOUNTER — Ambulatory Visit (INDEPENDENT_AMBULATORY_CARE_PROVIDER_SITE_OTHER): Payer: BC Managed Care – PPO | Admitting: Family Medicine

## 2018-10-05 VITALS — BP 142/98 | HR 80 | Temp 97.1°F | Ht 70.5 in | Wt 294.0 lb

## 2018-10-05 DIAGNOSIS — G4733 Obstructive sleep apnea (adult) (pediatric): Secondary | ICD-10-CM

## 2018-10-05 DIAGNOSIS — E7800 Pure hypercholesterolemia, unspecified: Secondary | ICD-10-CM

## 2018-10-05 DIAGNOSIS — R5383 Other fatigue: Secondary | ICD-10-CM

## 2018-10-05 DIAGNOSIS — I119 Hypertensive heart disease without heart failure: Secondary | ICD-10-CM

## 2018-10-05 DIAGNOSIS — Z0000 Encounter for general adult medical examination without abnormal findings: Secondary | ICD-10-CM

## 2018-10-05 DIAGNOSIS — J0110 Acute frontal sinusitis, unspecified: Secondary | ICD-10-CM

## 2018-10-05 MED ORDER — AMOXICILLIN 500 MG PO CAPS: 1000.0000 mg | ORAL_CAPSULE | Freq: Three times a day (TID) | ORAL | 0 refills | Status: AC

## 2018-10-05 NOTE — Progress Notes (Signed)
Patient: Eduardo Poole, Male    DOB: 1970/10/01, 48 y.o.   MRN: [id number] Visit Date: 10/05/2018  Today's Provider: Lelon Huh, MD   Chief Complaint  Patient presents with  . Annual Exam   Subjective:     Annual physical exam Eduardo Poole is a 48 y.o. male who presents today for health maintenance and complete physical. He feels fairly well.  Pt reports having left ear pain, sinus pain/pressure.  He reports not exercising. He reports he is sleeping poorly.  Pt reports waking up and not being able to go back to sleep.  He has been having more ear and sinus pain for several weeks. He has history of sleep apnea and is using CPAP every night, but has been feeling sleeping during the day and doses off easily.  He is followed by Dr. Claiborne Billings for CAD, was last seen in March at which time valsartan was double to 160mg  a day, however patient states he is only taking metoprolol once a day instead of twice a day as prescribed.  -----------------------------------------------------------------   Review of Systems  Constitutional: Positive for diaphoresis and fatigue. Negative for activity change, appetite change, chills, fever and unexpected weight change.  HENT: Positive for ear pain, hearing loss, nosebleeds, sinus pressure, sneezing and tinnitus. Negative for congestion, dental problem, drooling, ear discharge, facial swelling, mouth sores, postnasal drip, rhinorrhea, sinus pain, sore throat, trouble swallowing and voice change.   Eyes: Positive for photophobia, redness and itching. Negative for pain, discharge and visual disturbance.  Respiratory: Positive for apnea and shortness of breath. Negative for cough, choking, chest tightness, wheezing and stridor.   Cardiovascular: Positive for palpitations and leg swelling. Negative for chest pain.  Gastrointestinal: Negative.   Endocrine: Positive for heat intolerance and polyuria. Negative for cold intolerance, polydipsia and  polyphagia.  Genitourinary: Negative.   Musculoskeletal: Positive for arthralgias, back pain, gait problem, joint swelling, myalgias, neck pain and neck stiffness.  Skin: Negative.   Allergic/Immunologic: Negative.   Neurological: Positive for headaches. Negative for dizziness, tremors, seizures, syncope, facial asymmetry, speech difficulty, weakness, light-headedness and numbness.  Hematological: Negative.   Psychiatric/Behavioral: Positive for agitation, confusion and decreased concentration. Negative for behavioral problems, dysphoric mood, hallucinations, self-injury, sleep disturbance and suicidal ideas. The patient is not nervous/anxious and is not hyperactive.     Social History      He  reports that he has never smoked. He has quit using smokeless tobacco.  His smokeless tobacco use included chew. He reports that he does not drink alcohol or use drugs.       Social History   Socioeconomic History  . Marital status: Married    Spouse name: Not on file  . Number of children: 2  . Years of education: 19  . Highest education level: Not on file  Occupational History  . Occupation: Employed    Comment: Works at [employer]  . Financial resource strain: Not on file  . Food insecurity    Worry: Not on file    Inability: Not on file  . Transportation needs    Medical: Not on file    Non-medical: Not on file  Tobacco Use  . Smoking status: Never Smoker  . Smokeless tobacco: Former Systems developer    Types: Chew  Substance and Sexual Activity  . Alcohol use: No    Alcohol/week: 1.0 standard drinks    Types: 1 Standard drinks or equivalent per week  Comment: social drinker  . Drug use: No  . Sexual activity: Yes  Lifestyle  . Physical activity    Days per week: Not on file    Minutes per session: Not on file  . Stress: Not on file  Relationships  . Social Herbalist on phone: Not on file    Gets together: Not on file    Attends religious service: Not  on file    Active member of club or organization: Not on file    Attends meetings of clubs or organizations: Not on file    Relationship status: Not on file  Other Topics Concern  . Not on file  Social History Narrative  . Not on file    Past Medical History:  Diagnosis Date  . CAD (coronary artery disease)    a. 07/2014 Inf STEMI/PTCA:  LM nl, LAD nl, LCX nl, RCA ulcerated plaque (PTCA only);  b. 10/2014 MV: intermediate risk, ? inflat isch/scar;  c. 10/2014 Cath: Nl cors w/o RCA restenosis.  . Diastolic dysfunction    a. 07/2014 Echo: EF 60-65%, Gr1 DD.  . DVT of lower extremity (deep venous thrombosis) ([place])    a. ~ 2009, per pt was on lovenox/coumadin  . GERD (gastroesophageal reflux disease)   . Hyperlipidemia   . Hypertension   . Hypertensive heart disease   . TIA (transient ischemic attack) 07/23/2014     Patient Active Problem List   Diagnosis Date Noted  . Left leg DVT ([place]) 08/03/2017  . Left knee pain 04/09/2017  . Myalgia and myositis 04/09/2017  . BMI 37.0-37.9, adult 09/17/2016  . OSA (obstructive sleep apnea) 01/18/2016  . Hyperlipidemia   . CAD (coronary artery disease)   . Hypertensive heart disease without heart failure   . DDD (degenerative disc disease), cervical 09/19/2015  . DDD (degenerative disc disease), lumbar 09/19/2015  . Hyperlipidemia LDL goal <70 07/15/2015  . Frequent PVCs 12/02/2014  . Abnormal stress test   . History of CVA in adulthood 10/20/2014  . Temporary cerebral vascular dysfunction 10/04/2014  . Tremor 10/04/2014  . History of TIA (transient ischemic attack) 08/04/2014  . Diastolic dysfunction 71/69/6789  . Anxiety 08/02/2014  . Depression 08/02/2014  . Diplopia 08/02/2014  . Fatigue 08/02/2014  . Headache 08/02/2014  . H/O adenomatous polyp of colon 08/02/2014  . History of DVT (deep vein thrombosis) 08/02/2014  . Hypertension 08/02/2014  . Insomnia 08/02/2014  . Lymphadenopathy 08/02/2014  . Sleep disorder 08/02/2014  .  Chest wall pain 08/02/2014  . Pleurisy 08/02/2014  . Personal history of other venous thrombosis and embolism 08/02/2014  . ST elevation myocardial infarction (STEMI) involving right coronary artery with complication ([place]) 38/11/1749  . Combined fat and carbohydrate induced hyperlipemia 03/24/2014  . Breathlessness on exertion 03/24/2014  . History of knee surgery 07/18/2011  . Idiopathic localized osteoarthropathy 06/14/2011  . Pain in joint involving lower leg 06/14/2011    Past Surgical History:  Procedure Laterality Date  . ARTHROPLASTY Right 07/09/2010  . CARDIAC CATHETERIZATION N/A 07/23/2014   Procedure: Left Heart Cath;  Surgeon: Troy Sine, MD;  Location: [facility];  Service: Cardiovascular;  Laterality: N/A;  . CARDIAC CATHETERIZATION N/A 07/23/2014   Procedure: Coronary Balloon Angioplasty;  Surgeon: Troy Sine, MD;  Location: [facility];  Service: Cardiovascular;  Laterality: N/A;  . CARDIAC CATHETERIZATION N/A 11/10/2014   Procedure: Left Heart Cath and Coronary Angiography;  Surgeon: Troy Sine, MD;  Location: [facility]  INVASIVE CV LAB;  Service: Cardiovascular;  Laterality: N/A;  . CERVICAL [place] SURGERY  2015  . JOINT REPLACEMENT Right    Right knee  . MENISCUS REPAIR Right 11/03/1995   Meniscus Lateral Tear: repaired by Dr. Sabra Heck at [facility]  . NASAL SINUS SURGERY     Deviated Septum  . REPLACEMENT TOTAL KNEE Right 2011   [facility]    Family History        Family Status  Relation Name Status  . Mother  Alive  . Father  Alive  . Sister  Alive  . PGF  (Not Specified)  . MGF  Deceased        His family history includes Arthritis in his mother and sister; Autoimmune disease in his father; Cancer in his maternal grandfather; Diabetes in his maternal grandfather and paternal grandfather; Diverticulitis in his mother; Gout in his father; Hypertension in his mother.      No Known Allergies   Current Outpatient  Medications:  .  aspirin EC 81 MG EC tablet, Take 1 tablet (81 mg total) by mouth daily. (Patient taking differently: Take 81 mg by mouth at bedtime. ), Disp: , Rfl:  .  atorvastatin (LIPITOR) 80 MG tablet, Take 1 tablet (80 mg total) by mouth daily., Disp: 90 tablet, Rfl: 3 .  clopidogrel (PLAVIX) 75 MG tablet, Take 1 tablet (75 mg total) by mouth daily., Disp: 90 tablet, Rfl: 3 .  metoprolol tartrate (LOPRESSOR) 25 MG tablet, Take 1 tablet (25 mg total) by mouth 2 (two) times daily. (Patient taking differently: Take 25 mg by mouth daily. ), Disp: 180 tablet, Rfl: 3 .  nitroGLYCERIN (NITROSTAT) 0.4 MG SL tablet, Place 1 tablet (0.4 mg total) under the tongue every 5 (five) minutes as needed for chest pain., Disp: 25 tablet, Rfl: 12 .  sertraline (ZOLOFT) 100 MG tablet, Take 1 tablet (100 mg total) by mouth daily., Disp: 90 tablet, Rfl: 3 .  valsartan (DIOVAN) 160 MG tablet, Take 160 mg by mouth daily., Disp: , Rfl:    Patient Care Team: Birdie Sons, MD as PCP - General (Family Medicine) Troy Sine, MD as PCP - Cardiology (Cardiology) Burnis Kingfisher, MD as Referring Physician (Orthopedic Surgery) Beverly Gust, MD (Unknown Physician Specialty) Troy Sine, MD as Consulting Physician (Cardiology)    Objective:    Vitals: BP (!) 150/88 (BP Location: Right Arm, Patient Position: Sitting, Cuff Size: Large)   Pulse 80   Temp (!) 97.1 F (36.2 C) (Temporal)   Ht 5' 10.5" (1.791 m)   Wt 294 lb (133.4 kg)   BMI 41.59 kg/m    Vitals:   10/05/18 1414 10/05/18 1443  BP: (!) 150/88 (!) 142/98  Pulse: 80   Temp: (!) 97.1 F (36.2 C)   TempSrc: Temporal   Weight: 294 lb (133.4 kg)   Height: 5' 10.5" (1.791 m)      Physical Exam   General Appearance:    Alert, cooperative, no distress, appears stated age, morbidly obese  Head:    Normocephalic, without obvious abnormality, atraumatic  Eyes:    PERRL, conjunctiva/corneas clear, EOM's intact, fundi    benign, both eyes        Ears:    Normal TM's and external ear canals, both ears  Nose:   Nares normal, septum midline, mucosa normal, no drainage   or sinus tenderness. Frontal sinuses tender.   Throat:   Lips, mucosa, and tongue normal; teeth and gums normal  Neck:   Supple, symmetrical, trachea midline, no adenopathy;       thyroid:  No enlargement/tenderness/nodules; no carotid   bruit or JVD  Back:     Symmetric, no curvature, ROM normal, no CVA tenderness  Lungs:     Clear to auscultation bilaterally, respirations unlabored  Chest wall:    No tenderness or deformity  Heart:    Normal heart rate. Normal rhythm. No murmurs, rubs, or gallops.  S1 and S2 normal  Abdomen:     Soft, non-tender, bowel sounds active all four quadrants,    no masses, no organomegaly  Genitalia:    deferred  Rectal:    deferred  Extremities:   All extremities are intact. No cyanosis or edema  Pulses:   2+ and symmetric all extremities  Skin:   Skin color, texture, turgor normal, no rashes or lesions  Lymph nodes:   Cervical, supraclavicular, and axillary nodes normal  Neurologic:   CNII-XII intact. Normal strength, sensation and reflexes      throughout    Depression Screen PHQ 2/9 Scores 09/19/2017 09/17/2016  PHQ - 2 Score 5 3  PHQ- 9 Score 22 14       Assessment & Plan:     Routine Health Maintenance and Physical Exam  Exercise Activities and Dietary recommendations Goals   None     Immunization History  Administered Date(s) Administered  . Influenza,inj,Quad PF,6+ Mos 12/21/2012, 12/02/2014  . Td 06/19/2010    Health Maintenance  Topic Date Due  . HIV Screening  12/19/1984  . INFLUENZA VACCINE  09/19/2018  . COLONOSCOPY  04/08/2019  . TETANUS/TDAP  06/18/2020     Discussed health benefits of physical activity, and encouraged him to engage in regular exercise appropriate for his age and condition.    --------------------------------------------------------------------  1. Annual physical exam    2. Pure hypercholesterolemia He is tolerating atorvastatin well with no adverse effects.   - Comprehensive metabolic panel - Lipid panel - CBC  3. OSA (obstructive sleep apnea) Is using CPAP with constant pressure of 13cm every night, but having more trouble feeling sleepy during the day.  - Pulse oximetry, overnight; Future  4. Hypertensive heart disease without heart failure Is tolerating increase dose of valsartan, but BP not to goal. Not sure why he is only taking metoprolol once a day, but he states he has plenty of medication at home and will start taking twice a day.   5. Other fatigue  - Comprehensive metabolic panel - CBC - TSH  6. Morbid obesity due to excess calories ([place])   7. Acute frontal sinusitis, recurrence not specified  - amoxicillin (AMOXIL) 500 MG capsule; Take 2 capsules (1,000 mg total) by mouth 3 (three) times daily for 5 days.  Dispense: 30 capsule; Refill: 0    Lelon Huh, MD  [facility]

## 2018-10-05 NOTE — Patient Instructions (Addendum)
.   Please review the attached list of medications and notify my office if there are any errors.   . Please bring all of your medications to every appointment so we can make sure that our medication list is the same as yours.   . We will have flu vaccines available after Labor Day. Please go to your pharmacy or call the office in early September to schedule you flu shot.  . Please go to the lab draw station in [address] on the second floor of [facility]  when you are fasting for 8 hours. Normal hours are 8:00am to 12:30pm and 1:30pm to 4:00pm Monday through Friday   Start taking metoprolol 25mg  tablet TWICE a day instead once a day

## 2018-10-08 DIAGNOSIS — E7800 Pure hypercholesterolemia, unspecified: Secondary | ICD-10-CM | POA: Diagnosis not present

## 2018-10-08 DIAGNOSIS — R5383 Other fatigue: Secondary | ICD-10-CM | POA: Diagnosis not present

## 2018-10-09 ENCOUNTER — Telehealth: Payer: Self-pay

## 2018-10-09 LAB — LIPID PANEL
Chol/HDL Ratio: 4.8 ratio (ref 0.0–5.0)
Cholesterol, Total: 135 mg/dL (ref 100–199)
HDL: 28 mg/dL — ABNORMAL LOW (ref >39)
LDL Calculated: 58 mg/dL (ref 0–99)
Triglycerides: 247 mg/dL — ABNORMAL HIGH (ref 0–149)
VLDL Cholesterol Cal: 49 mg/dL — ABNORMAL HIGH (ref 5–40)

## 2018-10-09 LAB — COMPREHENSIVE METABOLIC PANEL
ALT: 46 IU/L — ABNORMAL HIGH (ref 0–44)
AST: 32 IU/L (ref 0–40)
Albumin/Globulin Ratio: 1.7 (ref 1.2–2.2)
Albumin: 4.2 g/dL (ref 4.0–5.0)
Alkaline Phosphatase: 81 IU/L (ref 39–117)
BUN/Creatinine Ratio: 17 (ref 9–20)
BUN: 17 mg/dL (ref 6–24)
Bilirubin Total: 0.2 mg/dL (ref 0.0–1.2)
CO2: 22 mmol/L (ref 20–29)
Calcium: 9.6 mg/dL (ref 8.7–10.2)
Chloride: 103 mmol/L (ref 96–106)
Creatinine, Ser: 1.01 mg/dL (ref 0.76–1.27)
GFR calc Af Amer: 101 mL/min/{1.73_m2} (ref >59)
GFR calc non Af Amer: 88 mL/min/{1.73_m2} (ref >59)
Globulin, Total: 2.5 g/dL (ref 1.5–4.5)
Glucose: 102 mg/dL — ABNORMAL HIGH (ref 65–99)
Potassium: 4.7 mmol/L (ref 3.5–5.2)
Sodium: 141 mmol/L (ref 134–144)
Total Protein: 6.7 g/dL (ref 6.0–8.5)

## 2018-10-09 LAB — TSH: TSH: 2.67 u[IU]/mL (ref 0.450–4.500)

## 2018-10-09 LAB — CBC
Hematocrit: 44.6 % (ref 37.5–51.0)
Hemoglobin: 14.8 g/dL (ref 13.0–17.7)
MCH: 29.7 pg (ref 26.6–33.0)
MCHC: 33.2 g/dL (ref 31.5–35.7)
MCV: 90 fL (ref 79–97)
Platelets: 321 10*3/uL (ref 150–450)
RBC: 4.98 x10E6/uL (ref 4.14–5.80)
RDW: 12.8 % (ref 11.6–15.4)
WBC: 8.1 10*3/uL (ref 3.4–10.8)

## 2018-10-09 NOTE — Telephone Encounter (Signed)
-----   Message from Birdie Sons, MD sent at 10/09/2018  9:35 AM EDT ----- Cholesterol is good, but triglycerides are high and HDL is low. Try to get more UNsaturated fats in diet such as fatty fish, and olive oil, nuts and seeds. Exercise at least 30 minutes a day. Continue current dose of atorvastatin. Rest of labs are good. Will send copy of labs to Dr. Claiborne Billings to review.

## 2018-10-09 NOTE — Telephone Encounter (Signed)
Pt advised.   Thanks,   -Dodi Leu  

## 2018-10-09 NOTE — Telephone Encounter (Signed)
LMTCB 10/09/2018   Thanks,   -Mickel Baas

## 2018-10-19 ENCOUNTER — Other Ambulatory Visit: Payer: Self-pay

## 2018-10-19 DIAGNOSIS — I119 Hypertensive heart disease without heart failure: Secondary | ICD-10-CM

## 2018-10-19 MED ORDER — VALSARTAN 160 MG PO TABS: 160.0000 mg | ORAL_TABLET | Freq: Every day | ORAL | 1 refills | Status: DC

## 2018-10-23 ENCOUNTER — Telehealth: Payer: Self-pay

## 2018-10-23 NOTE — Telephone Encounter (Signed)
Patient's wife called and stated that she will measure his waist and call you back with measurements.

## 2018-10-23 NOTE — Telephone Encounter (Signed)
Tried calling patient to advise him that we need to measure his waist circumference for the [employer] form he dropped off. Left message to call back. Form is in my desk.

## 2018-10-23 NOTE — Telephone Encounter (Signed)
Dr. Caryn Section, is it ok for patient's wife to do waist measurement, or does this measurement need to be done by our office?

## 2018-10-27 NOTE — Telephone Encounter (Signed)
Pt's waist circumference is 39 1/2 in.  Con Memos

## 2018-10-27 NOTE — Telephone Encounter (Signed)
That's fine, can enter his measurement on his biometric form.

## 2018-10-27 NOTE — Telephone Encounter (Signed)
Waist circumference entered on health screening form and then faxed to the number listed on the form.

## 2018-12-07 DIAGNOSIS — D225 Melanocytic nevi of trunk: Secondary | ICD-10-CM | POA: Diagnosis not present

## 2018-12-07 DIAGNOSIS — D2362 Other benign neoplasm of skin of left upper limb, including shoulder: Secondary | ICD-10-CM | POA: Diagnosis not present

## 2018-12-07 DIAGNOSIS — D485 Neoplasm of uncertain behavior of skin: Secondary | ICD-10-CM | POA: Diagnosis not present

## 2018-12-07 DIAGNOSIS — Z86018 Personal history of other benign neoplasm: Secondary | ICD-10-CM

## 2018-12-07 DIAGNOSIS — L570 Actinic keratosis: Secondary | ICD-10-CM | POA: Diagnosis not present

## 2018-12-07 DIAGNOSIS — L918 Other hypertrophic disorders of the skin: Secondary | ICD-10-CM | POA: Diagnosis not present

## 2018-12-07 DIAGNOSIS — D1800 Hemangioma unspecified site: Secondary | ICD-10-CM | POA: Diagnosis not present

## 2018-12-07 DIAGNOSIS — D239 Other benign neoplasm of skin, unspecified: Secondary | ICD-10-CM | POA: Diagnosis not present

## 2018-12-07 HISTORY — DX: Personal history of other benign neoplasm: Z86.018

## 2018-12-26 ENCOUNTER — Other Ambulatory Visit: Payer: Self-pay | Admitting: Family Medicine

## 2019-03-01 DIAGNOSIS — L578 Other skin changes due to chronic exposure to nonionizing radiation: Secondary | ICD-10-CM | POA: Diagnosis not present

## 2019-03-01 DIAGNOSIS — D2272 Melanocytic nevi of left lower limb, including hip: Secondary | ICD-10-CM | POA: Diagnosis not present

## 2019-03-01 DIAGNOSIS — D2271 Melanocytic nevi of right lower limb, including hip: Secondary | ICD-10-CM | POA: Diagnosis not present

## 2019-03-01 DIAGNOSIS — Z86018 Personal history of other benign neoplasm: Secondary | ICD-10-CM | POA: Diagnosis not present

## 2019-03-01 DIAGNOSIS — L570 Actinic keratosis: Secondary | ICD-10-CM | POA: Diagnosis not present

## 2019-03-26 ENCOUNTER — Telehealth: Payer: Self-pay

## 2019-03-26 NOTE — Telephone Encounter (Signed)
Copied from [place] [id number]. Topic: General - Inquiry >> Mar 26, 2019 12:57 PM Richardo Priest, NT wrote: Reason for CRM: Mariann Laster with [facility] heartcare called in stating she is needing to speak with Dr.Fisher's nurse in regards to patients cpap machine. Please advise and call back after 2 at [phone].

## 2019-03-30 ENCOUNTER — Telehealth: Payer: Self-pay

## 2019-03-30 ENCOUNTER — Telehealth (INDEPENDENT_AMBULATORY_CARE_PROVIDER_SITE_OTHER): Payer: BC Managed Care – PPO | Admitting: Cardiovascular Disease

## 2019-03-30 VITALS — Ht 70.0 in | Wt 277.0 lb

## 2019-03-30 DIAGNOSIS — E785 Hyperlipidemia, unspecified: Secondary | ICD-10-CM

## 2019-03-30 DIAGNOSIS — I2510 Atherosclerotic heart disease of native coronary artery without angina pectoris: Secondary | ICD-10-CM

## 2019-03-30 DIAGNOSIS — G4733 Obstructive sleep apnea (adult) (pediatric): Secondary | ICD-10-CM

## 2019-03-30 DIAGNOSIS — R002 Palpitations: Secondary | ICD-10-CM

## 2019-03-30 DIAGNOSIS — I10 Essential (primary) hypertension: Secondary | ICD-10-CM | POA: Diagnosis not present

## 2019-03-30 MED ORDER — METOPROLOL SUCCINATE ER 50 MG PO TB24: 50.0000 mg | ORAL_TABLET | Freq: Every day | ORAL | 3 refills | Status: DC

## 2019-03-30 NOTE — Patient Instructions (Addendum)
Medication Instructions:  STOP TAKING METOPROLOL TARTRATE. BEGIN TAKING METOPROLOL SUCCINATE 25MG  DAILY =(1/2 TABLET DAILY) *If you need a refill on your cardiac medications before your next appointment, please call your pharmacy*  Lab Work: IF YOU ARE HAVING LABWORK DRAW AT PCP, PLEASE BRING WITH YOU TO FOLLOW UP APPT. If you have labs (blood work) drawn today and your tests are completely normal, you will receive your results only by: Marland Kitchen MyChart Message (if you have MyChart) OR . A paper copy in the mail If you have any lab test that is abnormal or we need to change your treatment, we will call you to review the results.   Follow-Up: At [facility], you and your health needs are our priority.  As part of our continuing mission to provide you with exceptional heart care, we have created designated Provider Care Teams.  These Care Teams include your primary Cardiologist (physician) and Advanced Practice Providers (APPs -  Physician Assistants and Nurse Practitioners) who all work together to provide you with the care you need, when you need it.  Your next appointment:   4 month(s)  The format for your next appointment:   In Person  Provider:   Shelva Majestic, MD  Other Instructions WE ARE SENDING IN A PRESCRIPTION FOR METOPROLOL SUCCINATE 50MG . PER DR.KELLY, ONLY TAKE 1/2 TABLET (25MG  FOR NOW) MONITOR BP AND HR. MAY NEED TO INCREASE THE DOSE TO THE FULL TABLET AT A LATER DATE.

## 2019-03-30 NOTE — Telephone Encounter (Signed)
Reviewed AVS from virtual appt. with pt. Pt agreeable and verbalized understanding. No questions or concerns at this time

## 2019-03-30 NOTE — Progress Notes (Signed)
Virtual Visit via Video Note   This visit type was conducted due to national recommendations for restrictions regarding the COVID-19 Pandemic (e.g. social distancing) in an effort to limit this patient's exposure and mitigate transmission in our community.  Due to his co-morbid illnesses, this patient is at least at moderate risk for complications without adequate follow up.  This format is felt to be most appropriate for this patient at this time.  All issues noted in this document were discussed and addressed.  A limited physical exam was performed with this format.  Please refer to the patient's chart for his consent to telehealth for [facility].   Date:  03/30/2019   ID:  Eduardo Poole, DOB 1970-12-28, MRN [id number]  Patient Location: Home Provider Location: Home  PCP:  Birdie Sons, MD  Cardiologist:  Shelva Majestic, MD  Electrophysiologist:  None   Evaluation Performed:  Follow-Up Visit  Chief Complaint: Sleep/cardiology evaluation; last evaluated by me September 2018  History of Present Illness:    Eduardo Poole is a 49 y.o. male who suffered an acute coronary syndrome on June 2016.   He initially presented [place] where his ECG showed 2 mm of inferior ST segment elevation and a code STEMI was activated.  He also had transient TIA symptomatology.  Emergent catheterization revealed an ulcerated plaque in the distal RCA which had reperfused on anticoagulation therapy.  There was no residual high-grade stenosis.  PTCA alone was performed and the lesion was not stented.  His other coronary arteries were normal.  Left ventriculography showed normal LV function and a normal appearing aortic anatomy.  He was continued on Angiomax and started on aspirin and Brilinta.    An echo Doppler study showed an EF of 60-65% without wall motion abnormalities.  There was grade 1 diastolic dysfunction. Subsequently, he had noted some occasional PVCs but these have stabilized.  He  developed some recurrent chest pain symptomatology in September 2016 which led to a nuclear stress test was wasn't interpreted as intermediate risk and suggesting possible scar/ischemia inferolaterally.  Repeat cardiac catheterization was done in 11/10/2014 and this revealed normal coronary arteries without evidence for restenosis in the distal RCA at the site of the prior ulcerated plaque.    Over the past year, he had continued to be stable.  He denies chest pain, shortness of breath, or palpitations.  He works as an Dispensing optician.  I saw him in May 2017 for preoperative clearance prior to undergoing cervical disc surgery.  He tolerated that well from a cardiovascular standpoint. He has remote history of suffering a DVT approximately 9 years ago and transiently was treated with Coumadin.  When I saw him for evaluation I was concerned about the possibility of sleep apnea.  He underwent a sleep study which was done at [facility].  He was told of having sleep apnea.  He subsequently was started on CPAP therapy and since initiating therapy.  He feels his sleep is significantly improved.  He is sleeping on average at least 7-1/2-8 hours per night.  He is unaware of breakthrough snoring.  He admits to 100% compliance.  He has a ResMed AirSense 10 unit with heated hose.  He presented to [facility] emergency room in September 2017 with somewhat different chest pain.  At that time, he admitted that he was under significant stress that was work-related.  Troponins and CT scan of his chest were negative.  He has since changed jobs.  He does the same type of work but works at home.  He tries to be active and intermittently walks on a treadmill and does yard work.  He has been bothered by DJD of his thoracic back.  He continues to use CPAP with 100% compliance and feels significantly better.  He is on CPAP therapy at 13 cm water pressure continues to use this with 100%  compliance.  Since I last saw him, he denies any recurrent episodes of chest pain, palpitations or shortness of breath.  He does not routinely exercise.  His job is sedentary.  He is exercise has been limited by DJD and arthritis.  He continues to use CPAP.  His last laboratory was checked in December 2017 and at that time his LDL cholesterol was 47.   He was diagnosed with DVT based on venous Doppler on 07/31/2017 which revealed isolated calf DVT involving the posterior tibial vein.  Hypercoagulable panel was ordered which came back negative.  He was placed on 62-monthcourse of Xarelto then stop afterward.  He has been followed by hematology/oncology service and was told that if he was to have another episode of DVT, he will need lifelong treatment.  He was evaluated bt HAlmyra Deforest PA on 04/17/2018 and was not longer on Xarelto.   He continued to have intermittent leg cramps worse with ambulation.  On physical exam, currently had some difficulty in distal pulse detection and he underwent lower extremity Doppler study which showed normal ABIs.  During that evaluation, blood pressure was elevated and valsartan was further titrated.  Presently, the patient states that he has been under increased stress and times during very stressful moments admits to rare palpitations.  He denies associated chest tightness or chest pressure.  He has a history of significant obesity and his weight had peaked at 298 pounds but he recently has lost 21 pounds over 4 months.  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  He has been on CPAP therapy and had a diagnostic [place]omn[place] which showed severe sleep apnea with an AHI of 36.4/h and respiratory disturbance index of 36.8/h.  Rem related AHI and RDI were 49.7/h.  There was severe positional sleep apnea in the supine position with an AHI of 89.4/h.  He has a ResMed air sense 10  CPAP unit with set up date November 30, 2015.  At a 13 cm set pressure, AHI is excellent at 0.2.  Compliance is excellent with 97% of usage days and average usage 7 hours and 33 minutes per night.  He is in need of a new DME company.  He presents for telemedicine evaluation.   The patient does not have symptoms concerning for COVID-19 infection (fever, chills, cough, or new shortness of breath).    Past Medical History:  Diagnosis Date  . CAD (coronary artery disease)    a. 07/2014 Inf STEMI/PTCA:  LM nl, LAD nl, LCX nl, RCA ulcerated plaque (PTCA only);  b. 10/2014 MV: intermediate risk, ? inflat isch/scar;  c. 10/2014 Cath: Nl cors w/o RCA restenosis.  . Diastolic dysfunction    a. 07/2014 Echo: EF 60-65%, Gr1 DD.  . D[place] of [place] extremity (deep venous thrombosis) [place]    a. ~ 2009, per pt was on lovenox/coumadin  . GERD (gastroesophageal reflux disease)   . Hyperlipidemia   . Hypertension   . Hypertensive heart disease   .  TIA (transient ischemic attack) 07/23/2014   Past Surgical History:  Procedure Laterality Date  . ARTHROPLASTY Right 07/09/2010  . CARDIAC CATHETERIZATION N/A 07/23/2014   Procedure: Left Heart Cath;  Surgeon: Troy Sine, MD;  Location: [place] [facility];  Service: Cardiovascular;  Laterality: N/A;  . CARDIAC CATHETERIZATION N/A 07/23/2014   Procedure: Coronary Balloon Angioplasty;  Surgeon: Troy Sine, MD;  Location: [place] [facility];  Service: Cardiovascular;  Laterality: N/A;  . CARDIAC CATHETERIZATION N/A 11/10/2014   Procedure: Left Heart Cath and Coronary Angiography;  Surgeon: Troy Sine, MD;  Location: [place] [facility];  Service: Cardiovascular;  Laterality: N/A;  . CERVICAL [place] SURGERY  2015  . JOINT REPLACEMENT Right    Right knee  . MENISCUS REPAIR Right 11/03/1995   Meniscus Lateral Tear: repaired by Dr. Sabra Heck [facility]  . NASAL SINUS SURGERY     Deviated Septum  . REPLACEMENT TOTAL KNEE Right 2011   [facility]     Current Meds  Medication Sig  . aspirin EC 81 MG EC tablet Take 1 tablet (81 mg total) by mouth daily. (Patient taking differently: Take 81 mg by mouth at bedtime. )  . atorvastatin (LIPITOR) 80 MG tablet Take 1 tablet (80 mg total) by mouth daily.  . clopidogrel (PLAVIX) 75 MG tablet Take 1 tablet (75 mg total) by mouth daily.  . metoprolol tartrate (LOPRESSOR) 25 MG tablet Take 1 tablet (25 mg total) by mouth 2 (two) times daily. (Patient taking differently: Take 25 mg by mouth daily. )  . nitroGLYCERIN (NITROSTAT) 0.4 MG SL tablet Place 1 tablet (0.4 mg total) under the tongue every 5 (five) minutes as needed for chest pain.  Marland Kitchen sertraline (ZOLOFT) 100 MG tablet TAKE 1 TABLET BY MOUTH EVERY DAY  . valsartan (DIOVAN) 160 MG tablet Take 1 tablet (160 mg total) by mouth daily.     Allergies:   Patient has no known allergies.   Social History   Tobacco Use  . Smoking status: Never Smoker  . Smokeless tobacco: Former Systems developer    Types: Chew  Substance Use Topics  . Alcohol use: No    Alcohol/week: 1.0 standard drinks    Types: 1 Standard drinks or equivalent per week    Comment: social drinker  . Drug use: No     Family Hx: The patient's family history includes Arthritis in his mother and sister; Autoimmune disease in his father; Cancer in his maternal grandfather; Diabetes in his maternal grandfather and paternal grandfather; Diverticulitis in his mother; Gout in his father; Hypertension in his mother.  ROS:   Please see the history of present illness.    Significant obesity with purposeful recent weight loss of 21 pounds No fevers chills or night sweats No cough Positive for palpitations No chest pain No abdominal discomfort No edema OSA on CPAP and sleeping well with treatment All other systems reviewed and are negative.   Prior CV studies:   The following studies were reviewed today:  I reviewed the patient's Polly sonogram interpretation from [facility] dated November 02, 2015  Download was obtained for compliance evaluation.  Labs/Other Tests and Data Reviewed:    EKG:  An ECG dated April 17, 2018 was personally reviewed today and demonstrated:  Normal sinus rhythm at 61 bpm  Recent Labs: 10/08/2018: ALT 46; BUN 17; Creatinine, Ser 1.01; Hemoglobin 14.8; Platelets 321; Potassium 4.7; Sodium 141; TSH 2.670   Recent Lipid  Panel Lab Results  Component Value Date/Time   CHOL 135 10/08/2018 08:26 AM   TRIG 247 (H) 10/08/2018 08:26 AM   HDL 28 (L) 10/08/2018 08:26 AM   CHOLHDL 4.8 10/08/2018 08:26 AM   CHOLHDL 3.9 02/06/2016 10:58 AM   LDLCALC 58 10/08/2018 08:26 AM    Wt Readings from Last 3 Encounters:  03/30/19 277 lb (125.6 kg)  10/05/18 294 lb (133.4 kg)  04/17/18 289 lb 3.2 oz (131.2 kg)     Objective:    Vital Signs:  Ht 5\' 10"  (1.778 m)   Wt 277 lb (125.6 kg)   BMI 39.75 kg/m    Since this was a virtual video visit I could not physically examine the patient.  However his appearance appeared stable and he was in no acute distress.  Vitals reviewed.  BMI approaching morbid obesity Breathing was normal and not labored There was no audible wheezing Palpation of his heart rhythm was regular He denies chest tendernes No edema Normal mood and affect  ASSESSMENT & PLAN:    1. CAD: Status post ACS June 2016 2 mm inferior ST elevation due to ulcerated plaque in distal RCA had reperfused on anticoagulation therapy.  PTCA alone was performed with excellent result.  Repeat catheterization September 2016 revealed normal coronary arteries without restenosis; no recurrent anginal symptoms. 2. History of DVT: No longer on anticoagulation therapy 3. OSA: Severe on diagnostic polysomnogram.  I reviewed this in detail again with the patient.  He is meeting compliance standards.  AHI is excellent at 0.2 at 13 cm set pressure.  He is in need for a new DME company since the company he had used before had gone  out of business.  Will try to set him up with [facility] locally or will obtain information regarding DME in [place].  He is currently is using a full facemask with significant leak.  New mask may be necessary. 4. Essential hypertension: Unable to obtain a blood pressure recording today.  He continues to be on valsartan 160 mg daily in addition to metoprolol tartrate 25 mg twice a day. 5. Palpitations: Stress mediated.  I have recommended changing metoprolol tartrate to metoprolol succinate 50 mg daily. 6. Hyperlipidemia: Target LDL less than 70.  He continues to be on atorvastatin 80 mg daily. 7. Obesity: BMI borderline morbid obesity at 39.8.  Discussed a heart healthy diet.  Discussed importance of weight loss both for his cardiovascular health as well as sleep apnea benefit.  COVID-19 Education: The signs and symptoms of COVID-19 were discussed with the patient and how to seek care for testing (follow up with PCP or arrange E-visit).  The importance of social distancing was discussed today.  Time:   Today, I have spent 24 minutes with the patient with telehealth technology discussing the above problems.     Medication Adjustments/Labs and Tests Ordered: Current medicines are reviewed at length with the patient today.  Concerns regarding medicines are outlined above.   Tests Ordered: No orders of the defined types were placed in this encounter.   Medication Changes: No orders of the defined types were placed in this encounter.   Follow Up: In office visit in 4 months  Signed, Shelva Majestic, MD  03/30/2019 9:52 AM    [place]

## 2019-03-31 ENCOUNTER — Telehealth: Payer: Self-pay | Admitting: Cardiovascular Disease

## 2019-03-31 NOTE — Telephone Encounter (Signed)
-----   Message from Darlyn Chamber June, RN sent at 03/30/2019 10:47 AM EST ----- Please schedule pt for 4 month follow up in office Thank you!

## 2019-03-31 NOTE — Telephone Encounter (Signed)
03/31/2019 -- LVM for patient to call and schedule F/U

## 2019-04-01 ENCOUNTER — Encounter: Payer: Self-pay | Admitting: Cardiovascular Disease

## 2019-04-23 ENCOUNTER — Other Ambulatory Visit: Payer: Self-pay

## 2019-04-23 ENCOUNTER — Encounter: Payer: Self-pay | Admitting: Family Medicine

## 2019-04-23 ENCOUNTER — Ambulatory Visit (INDEPENDENT_AMBULATORY_CARE_PROVIDER_SITE_OTHER): Payer: BC Managed Care – PPO | Admitting: Family Medicine

## 2019-04-23 VITALS — BP 158/85 | HR 74 | Temp 96.2°F | Wt 288.0 lb

## 2019-04-23 DIAGNOSIS — Z23 Encounter for immunization: Secondary | ICD-10-CM

## 2019-04-23 DIAGNOSIS — E7800 Pure hypercholesterolemia, unspecified: Secondary | ICD-10-CM

## 2019-04-23 DIAGNOSIS — H6690 Otitis media, unspecified, unspecified ear: Secondary | ICD-10-CM

## 2019-04-23 MED ORDER — AMOXICILLIN 500 MG PO CAPS: 1000.0000 mg | ORAL_CAPSULE | Freq: Three times a day (TID) | ORAL | 0 refills | Status: AC

## 2019-04-23 MED ORDER — NEOMYCIN-POLYMYXIN-HC 3.5-10000-1 OT SOLN: 3.0000 [drp] | Freq: Four times a day (QID) | OTIC | 0 refills | Status: AC

## 2019-04-23 NOTE — Progress Notes (Signed)
Patient: Eduardo Poole Male    DOB: [birth year]   49 y.o.   MRN: [id number] Visit Date: 04/23/2019  Today's Provider: Lelon Huh, MD   Chief Complaint  Patient presents with  . Ear Pain   Subjective:     Otalgia  There is pain in the left ear. This is a new problem. Episode onset: 2 weeks ago. The problem occurs every few hours. There has been no fever. Pertinent negatives include no abdominal pain, coughing, diarrhea, ear discharge, headaches, hearing loss, neck pain, rhinorrhea, sore throat or vomiting. Treatments tried: sinus medication. The treatment provided no relief.    No Known Allergies   Current Outpatient Medications:  .  aspirin EC 81 MG EC tablet, Take 1 tablet (81 mg total) by mouth daily. (Patient taking differently: Take 81 mg by mouth at bedtime. ), Disp: , Rfl:  .  atorvastatin (LIPITOR) 80 MG tablet, Take 1 tablet (80 mg total) by mouth daily., Disp: 90 tablet, Rfl: 3 .  clopidogrel (PLAVIX) 75 MG tablet, Take 1 tablet (75 mg total) by mouth daily., Disp: 90 tablet, Rfl: 3 .  metoprolol succinate (TOPROL XL) 50 MG 24 hr tablet, Take 1 tablet (50 mg total) by mouth daily. Take with or immediately following a meal., Disp: 90 tablet, Rfl: 3 .  nitroGLYCERIN (NITROSTAT) 0.4 MG SL tablet, Place 1 tablet (0.4 mg total) under the tongue every 5 (five) minutes as needed for chest pain., Disp: 25 tablet, Rfl: 12 .  sertraline (ZOLOFT) 100 MG tablet, TAKE 1 TABLET BY MOUTH EVERY DAY, Disp: 90 tablet, Rfl: 4 .  valsartan (DIOVAN) 160 MG tablet, Take 1 tablet (160 mg total) by mouth daily., Disp: 90 tablet, Rfl: 1  Review of Systems  Constitutional: Negative.   HENT: Positive for ear pain. Negative for ear discharge, hearing loss, rhinorrhea and sore throat.   Respiratory: Negative.  Negative for cough.   Cardiovascular: Negative.   Gastrointestinal: Negative for abdominal pain, diarrhea and vomiting.  Musculoskeletal: Negative.  Negative for neck pain.    Neurological: Negative for headaches.    Social History   Tobacco Use  . Smoking status: Never Smoker  . Smokeless tobacco: Former Systems developer    Types: Chew  Substance Use Topics  . Alcohol use: No    Alcohol/week: 1.0 standard drinks    Types: 1 Standard drinks or equivalent per week    Comment: social drinker      Objective:   BP (!) 158/85 (BP Location: Right Arm, Patient Position: Sitting, Cuff Size: Large)   Pulse 74   Temp (!) 96.2 F (35.7 C) (Temporal)   Wt 288 lb (130.6 kg)   BMI 41.32 kg/m  There is no height or weight on file to calculate BMI.   Physical Exam  General Appearance:    Obese male, alert, cooperative, in no acute distress  HENT:   right TM normal without fluid or infection and left TM fluid noted  Eyes:    PERRL, conjunctiva/corneas clear, EOM's intact       Lungs:     Clear to auscultation bilaterally, respirations unlabored  Heart:    Normal heart rate. Normal rhythm. No murmurs, rubs, or gallops.   Neurologic:   Awake, alert, oriented x 3. No apparent focal neurological           defect.             Assessment & Plan    1. Acute otitis media,  unspecified otitis media type  - amoxicillin (AMOXIL) 500 MG capsule; Take 2 capsules (1,000 mg total) by mouth 3 (three) times daily for 5 days.  Dispense: 30 capsule; Refill: 0 - neomycin-polymyxin-hydrocortisone (CORTISPORIN) OTIC solution; Place 3 drops into the left ear 4 (four) times daily for 7 days.  Dispense: 10 mL; Refill: 0  2. Pure hypercholesterolemia He is tolerating atorvastatin well with no adverse effects.  He is also due for lipid panel today.  - Comprehensive metabolic panel - Lipid panel  3. Need for influenza vaccination  - Flu Vaccine QUAD 36+ mos IM (Fluarix/Fluzone)     Lelon Huh, MD  [facility]

## 2019-04-23 NOTE — Patient Instructions (Signed)
.   Please review the attached list of medications and notify my office if there are any errors.    Please bring all of your medications to every appointment so we can make sure that our medication list is the same as yours.   . Please go to the lab draw station in [address] on the second floor of [facility]  when you are fasting for 8 hours. Normal hours are 8:00am to 12:30pm and 1:30pm to 4:00pm Monday through Friday     

## 2019-05-07 DIAGNOSIS — E7800 Pure hypercholesterolemia, unspecified: Secondary | ICD-10-CM | POA: Diagnosis not present

## 2019-05-08 LAB — LIPID PANEL
Chol/HDL Ratio: 4.1 ratio (ref 0.0–5.0)
Cholesterol, Total: 122 mg/dL (ref 100–199)
HDL: 30 mg/dL — ABNORMAL LOW (ref >39)
LDL Chol Calc (NIH): 66 mg/dL (ref 0–99)
Triglycerides: 150 mg/dL — ABNORMAL HIGH (ref 0–149)
VLDL Cholesterol Cal: 26 mg/dL (ref 5–40)

## 2019-05-08 LAB — COMPREHENSIVE METABOLIC PANEL
ALT: 47 IU/L — ABNORMAL HIGH (ref 0–44)
AST: 29 IU/L (ref 0–40)
Albumin/Globulin Ratio: 1.7 (ref 1.2–2.2)
Albumin: 4.5 g/dL (ref 4.0–5.0)
Alkaline Phosphatase: 85 IU/L (ref 39–117)
BUN/Creatinine Ratio: 24 — ABNORMAL HIGH (ref 9–20)
BUN: 23 mg/dL (ref 6–24)
Bilirubin Total: 0.2 mg/dL (ref 0.0–1.2)
CO2: 23 mmol/L (ref 20–29)
Calcium: 9.7 mg/dL (ref 8.7–10.2)
Chloride: 103 mmol/L (ref 96–106)
Creatinine, Ser: 0.96 mg/dL (ref 0.76–1.27)
GFR calc Af Amer: 107 mL/min/{1.73_m2} (ref >59)
GFR calc non Af Amer: 92 mL/min/{1.73_m2} (ref >59)
Globulin, Total: 2.7 g/dL (ref 1.5–4.5)
Glucose: 100 mg/dL — ABNORMAL HIGH (ref 65–99)
Potassium: 4.6 mmol/L (ref 3.5–5.2)
Sodium: 140 mmol/L (ref 134–144)
Total Protein: 7.2 g/dL (ref 6.0–8.5)

## 2019-06-22 ENCOUNTER — Other Ambulatory Visit: Payer: Self-pay | Admitting: Cardiovascular Disease

## 2019-07-03 ENCOUNTER — Other Ambulatory Visit: Payer: Self-pay | Admitting: Cardiovascular Disease

## 2019-07-03 DIAGNOSIS — I119 Hypertensive heart disease without heart failure: Secondary | ICD-10-CM

## 2019-07-16 ENCOUNTER — Encounter: Payer: Self-pay | Admitting: Cardiovascular Disease

## 2019-07-16 ENCOUNTER — Ambulatory Visit (INDEPENDENT_AMBULATORY_CARE_PROVIDER_SITE_OTHER): Payer: BC Managed Care – PPO | Admitting: Cardiovascular Disease

## 2019-07-16 ENCOUNTER — Other Ambulatory Visit: Payer: Self-pay

## 2019-07-16 DIAGNOSIS — I10 Essential (primary) hypertension: Secondary | ICD-10-CM | POA: Diagnosis not present

## 2019-07-16 DIAGNOSIS — G4733 Obstructive sleep apnea (adult) (pediatric): Secondary | ICD-10-CM

## 2019-07-16 DIAGNOSIS — I119 Hypertensive heart disease without heart failure: Secondary | ICD-10-CM

## 2019-07-16 DIAGNOSIS — E785 Hyperlipidemia, unspecified: Secondary | ICD-10-CM

## 2019-07-16 DIAGNOSIS — R002 Palpitations: Secondary | ICD-10-CM

## 2019-07-16 DIAGNOSIS — I2510 Atherosclerotic heart disease of native coronary artery without angina pectoris: Secondary | ICD-10-CM

## 2019-07-16 MED ORDER — VALSARTAN 160 MG PO TABS: 240.0000 mg | ORAL_TABLET | Freq: Every day | ORAL | 3 refills | Status: DC

## 2019-07-16 NOTE — Patient Instructions (Signed)
Medication Instructions:  INCREASE VALSARTAN TO 240MG  DAILY (1.5 TABS *If you need a refill on your cardiac medications before your next appointment, please call your pharmacy*   Follow-Up: At [facility], you and your health needs are our priority.  As part of our continuing mission to provide you with exceptional heart care, we have created designated Provider Care Teams.  These Care Teams include your primary Cardiologist (physician) and Advanced Practice Providers (APPs -  Physician Assistants and Nurse Practitioners) who all work together to provide you with the care you need, when you need it.  We recommend signing up for the patient portal called "MyChart".  Sign up information is provided on this After Visit Summary.  MyChart is used to connect with patients for Virtual Visits (Telemedicine).  Patients are able to view lab/test results, encounter notes, upcoming appointments, etc.  Non-urgent messages can be sent to your provider as well.   To learn more about what you can do with MyChart, go to [email or web address].    Your next appointment:   6 month(s)  The format for your next appointment:   In Person  Provider:   Shelva Majestic, MD

## 2019-07-16 NOTE — Progress Notes (Deleted)
Virtual Visit via Video Note   This visit type was conducted due to national recommendations for restrictions regarding the COVID-19 Pandemic (e.g. social distancing) in an effort to limit this patient's exposure and mitigate transmission in our community.  Due to his co-morbid illnesses, this patient is at least at moderate risk for complications without adequate follow up.  This format is felt to be most appropriate for this patient at this time.  All issues noted in this document were discussed and addressed.  A limited physical exam was performed with this format.  Please refer to the patient's chart for his consent to telehealth for [facility].   Date:  07/16/2019   ID:  Eduardo Poole, DOB 10-16-1970, MRN [id number]  Patient Location: Home Provider Location: Home  PCP:  Birdie Sons, MD  Cardiologist:  Shelva Majestic, MD  Electrophysiologist:  None   Evaluation Performed:  Follow-Up Visit  Chief Complaint: Sleep/cardiology evaluation; last evaluated by me September 2018  History of Present Illness:    Eduardo Poole is a 49 y.o. male who suffered an acute coronary syndrome on June 2016.   He initially presented [place] where his ECG showed 2 mm of inferior ST segment elevation and a code STEMI was activated.  He also had transient TIA symptomatology.  Emergent catheterization revealed an ulcerated plaque in the distal RCA which had reperfused on anticoagulation therapy.  There was no residual high-grade stenosis.  PTCA alone was performed and the lesion was not stented.  His other coronary arteries were normal.  Left ventriculography showed normal LV function and a normal appearing aortic anatomy.  He was continued on Angiomax and started on aspirin and Brilinta.    An echo Doppler study showed an EF of 60-65% without wall motion abnormalities.  There was grade 1 diastolic dysfunction. Subsequently, he had noted some occasional PVCs but these have stabilized.  He  developed some recurrent chest pain symptomatology in September 2016 which led to a nuclear stress test was wasn't interpreted as intermediate risk and suggesting possible scar/ischemia inferolaterally.  Repeat cardiac catheterization was done in 11/10/2014 and this revealed normal coronary arteries without evidence for restenosis in the distal RCA at the site of the prior ulcerated plaque.    Over the past year, he had continued to be stable.  He denies chest pain, shortness of breath, or palpitations.  He works as an Dispensing optician.  I saw him in May 2017 for preoperative clearance prior to undergoing cervical disc surgery.  He tolerated that well from a cardiovascular standpoint. He has remote history of suffering a DVT approximately 9 years ago and transiently was treated with Coumadin.  When I saw him for evaluation I was concerned about the possibility of sleep apnea.  He underwent a sleep study which was done at [facility].  He was told of having sleep apnea.  He subsequently was started on CPAP therapy and since initiating therapy.  He feels his sleep is significantly improved.  He is sleeping on average at least 7-1/2-8 hours per night.  He is unaware of breakthrough snoring.  He admits to 100% compliance.  He has a ResMed AirSense 10 unit with heated hose.  He presented to [facility] emergency room in September 2017 with somewhat different chest pain.  At that time, he admitted that he was under significant stress that was work-related.  Troponins and CT scan of his chest were negative.  He has since changed jobs.  He does the same type of work but works at home.  He tries to be active and intermittently walks on a treadmill and does yard work.  He has been bothered by DJD of his thoracic back.  He continues to use CPAP with 100% compliance and feels significantly better.  He is on CPAP therapy at 13 cm water pressure continues to use this with 100%  compliance.  Since I last saw him, he denies any recurrent episodes of chest pain, palpitations or shortness of breath.  He does not routinely exercise.  His job is sedentary.  He is exercise has been limited by DJD and arthritis.  He continues to use CPAP.  His last laboratory was checked in December 2017 and at that time his LDL cholesterol was 47.   He was diagnosed with DVT based on venous Doppler on 07/31/2017 which revealed isolated calf DVT involving the posterior tibial vein.  Hypercoagulable panel was ordered which came back negative.  He was placed on 62-monthcourse of Xarelto then stop afterward.  He has been followed by hematology/oncology service and was told that if he was to have another episode of DVT, he will need lifelong treatment.  He was evaluated bt HAlmyra Deforest PA on 04/17/2018 and was not longer on Xarelto.   He continued to have intermittent leg cramps worse with ambulation.  On physical exam, currently had some difficulty in distal pulse detection and he underwent lower extremity Doppler study which showed normal ABIs.  During that evaluation, blood pressure was elevated and valsartan was further titrated.  Presently, the patient states that he has been under increased stress and times during very stressful moments admits to rare palpitations.  He denies associated chest tightness or chest pressure.  He has a history of significant obesity and his weight had peaked at 298 pounds but he recently has lost 21 pounds over 4 months.  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  He has been on CPAP therapy and [place] which showed severe sleep apnea with an AHI of 36.4/h and respiratory disturbance index of 36.8/h.  Rem related AHI and RDI were 49.7/h.  There was severe positional sleep apnea in the supine position with an AHI of 89.4/h.  He has a ResMed air sense 10  CPAP unit with set up date November 30, 2015.  At a 13 cm set pressure, AHI is excellent at 0.2.  Compliance is excellent with 97% of usage days and average usage 7 hours and 33 minutes per night.  He is in need of a new DME company.  He presents for telemedicine evaluation.   The patient does not have symptoms concerning for COVID-19 infection (fever, chills, cough, or new shortness of breath).    Past Medical History:  Diagnosis Date  . CAD (coronary artery disease)    a. 07/2014 Inf STEMI/PTCA:  LM nl, LAD nl, LCX nl, RCA ulcerated plaque (PTCA only);  b. 10/2014 MV: intermediate risk, ? inflat isch/scar;  c. 10/2014 Cath: Nl cors w/o RCA restenosis.  . Diastolic dysfunction    a. 07/2014 Echo: EF 60-65%, Gr1 DD.  . [place] of [place]    a. ~ 2009, per pt was on lovenox/coumadin  . GERD (gastroesophageal reflux disease)   . Hyperlipidemia   . Hypertension   . Hypertensive heart disease   .  TIA (transient ischemic attack) 07/23/2014   Past Surgical History:  Procedure Laterality Date  . ARTHROPLASTY Right 07/09/2010  . CARDIAC CATHETERIZATION N/A 07/23/2014   Procedure: Left Heart Cath;  Surgeon: Troy Sine, MD;  Location: [place] [facility];  Service: Cardiovascular;  Laterality: N/A;  . CARDIAC CATHETERIZATION N/A 07/23/2014   Procedure: Coronary Balloon Angioplasty;  Surgeon: Troy Sine, MD;  Location: [place] [facility];  Service: Cardiovascular;  Laterality: N/A;  . CARDIAC CATHETERIZATION N/A 11/10/2014   Procedure: Left Heart Cath and Coronary Angiography;  Surgeon: Troy Sine, MD;  Location: [place] [facility];  Service: Cardiovascular;  Laterality: N/A;  . CERVICAL [place] SURGERY  2015  . JOINT REPLACEMENT Right    Right knee  . MENISCUS REPAIR Right 11/03/1995   Meniscus Lateral Tear: repaired by Dr. Sabra Heck at [facility]  . NASAL SINUS SURGERY     Deviated Septum  . REPLACEMENT TOTAL KNEE Right 2011   [facility]     Current Meds  Medication Sig  . aspirin EC 81 MG EC tablet Take 1 tablet (81 mg total) by mouth daily. (Patient taking differently: Take 81 mg by mouth at bedtime. )  . atorvastatin (LIPITOR) 80 MG tablet Take 1 tablet (80 mg total) by mouth daily.  . clopidogrel (PLAVIX) 75 MG tablet TAKE 1 TABLET BY MOUTH EVERY DAY  . metoprolol succinate (TOPROL XL) 50 MG 24 hr tablet Take 1 tablet (50 mg total) by mouth daily. Take with or immediately following a meal.  . nitroGLYCERIN (NITROSTAT) 0.4 MG SL tablet Place 1 tablet (0.4 mg total) under the tongue every 5 (five) minutes as needed for chest pain.  Marland Kitchen sertraline (ZOLOFT) 100 MG tablet TAKE 1 TABLET BY MOUTH EVERY DAY  . valsartan (DIOVAN) 160 MG tablet TAKE 1 TABLET BY MOUTH EVERY DAY     Allergies:   Patient has no known allergies.   Social History   Tobacco Use  . Smoking status: Never Smoker  . Smokeless tobacco: Former Systems developer    Types: Chew  Substance Use Topics  . Alcohol use: No    Alcohol/week: 1.0 standard drinks    Types: 1 Standard drinks or equivalent per week    Comment: social drinker  . Drug use: No     Family Hx: The patient's family history includes Arthritis in his mother and sister; Autoimmune disease in his father; Cancer in his maternal grandfather; Diabetes in his maternal grandfather and paternal grandfather; Diverticulitis in his mother; Gout in his father; Hypertension in his mother.  ROS:   Please see the history of present illness.    Significant obesity with purposeful recent weight loss of 21 pounds No fevers chills or night sweats No cough Positive for palpitations No chest pain No abdominal discomfort No edema OSA on CPAP and sleeping well with treatment All other systems reviewed and are negative.   Prior CV studies:   The following studies were reviewed today:  I reviewed the patient's [place] sonogram interpretation from [facility] dated November 02, 2015  Download was obtained for compliance evaluation.  Labs/Other Tests and Data Reviewed:    EKG:  An ECG dated April 17, 2018 was personally reviewed today and demonstrated:  Normal sinus rhythm at 61 bpm  Recent Labs: 10/08/2018: Hemoglobin 14.8; Platelets 321; TSH 2.670 05/07/2019: ALT 47; BUN 23; Creatinine, Ser 0.96; Potassium 4.6; Sodium 140   Recent Lipid Panel Lab Results  Component Value  Date/Time   CHOL 122 05/07/2019 09:38 AM   TRIG 150 (H) 05/07/2019 09:38 AM   HDL 30 (L) 05/07/2019 09:38 AM   CHOLHDL 4.1 05/07/2019 09:38 AM   CHOLHDL 3.9 02/06/2016 10:58 AM   LDLCALC 66 05/07/2019 09:38 AM    Wt Readings from Last 3 Encounters:  07/16/19 288 lb 9.6 oz (130.9 kg)  04/23/19 288 lb (130.6 kg)  03/30/19 277 lb (125.6 kg)     Objective:    Vital Signs:  BP 124/76   Pulse 69   Ht 5' 10"  (1.778 m)   Wt 288 lb 9.6 oz (130.9 kg)   SpO2 94%   BMI 41.41 kg/m    Since this was a virtual video visit I could not physically examine the patient.  However his appearance appeared stable and he was in no acute distress.  Vitals reviewed.  BMI approaching morbid obesity Breathing was normal and not labored There was no audible wheezing Palpation of his heart rhythm was regular He denies chest tendernes No edema Normal mood and affect  ASSESSMENT & PLAN:    1. CAD: Status post ACS June 2016 2 mm inferior ST elevation due to ulcerated plaque in distal RCA had reperfused on anticoagulation therapy.  PTCA alone was performed with excellent result.  Repeat catheterization September 2016 revealed normal coronary arteries without restenosis; no recurrent anginal symptoms. 2. History of DVT: No longer on anticoagulation therapy 3. OSA: Severe on diagnostic polysomnogram.  I reviewed this in detail again with the patient.  He is meeting compliance standards.  AHI is excellent at 0.2 at 13 cm set pressure.  He is in need for a new DME company since the company he had used  before had gone out of business.  Will try to set him up with [facility] locally or will obtain information regarding DME in [place].  He is currently is using a full facemask with significant leak.  New mask may be necessary. 4. Essential hypertension: Unable to obtain a blood pressure recording today.  He continues to be on valsartan 160 mg daily in addition to metoprolol tartrate 25 mg twice a day. 5. Palpitations: Stress mediated.  I have recommended changing metoprolol tartrate to metoprolol succinate 50 mg daily. 6. Hyperlipidemia: Target LDL less than 70.  He continues to be on atorvastatin 80 mg daily. 7. Obesity: BMI borderline morbid obesity at 39.8.  Discussed a heart healthy diet.  Discussed importance of weight loss both for his cardiovascular health as well as sleep apnea benefit.  COVID-19 Education: The signs and symptoms of COVID-19 were discussed with the patient and how to seek care for testing (follow up with PCP or arrange E-visit).  The importance of social distancing was discussed today.  Time:   Today, I have spent 24 minutes with the patient with telehealth technology discussing the above problems.     Medication Adjustments/Labs and Tests Ordered: Current medicines are reviewed at length with the patient today.  Concerns regarding medicines are outlined above.   Tests Ordered: No orders of the defined types were placed in this encounter.   Medication Changes: No orders of the defined types were placed in this encounter.   Follow Up: In office visit in 4 months  Signed, Shelva Majestic, MD  07/16/2019 1:56 PM    [place]    Cardiology Office Note    Date:  07/16/2019   ID:  Eduardo Poole, DOB 1970-05-08, MRN [id number]  PCP:  Birdie Sons, MD  Cardiologist:  Shelva Majestic, MD   No chief complaint on file.   History of Present Illness:  Eduardo Poole is a 49 y.o. male ***Eduardo Poole is a 49 y.o. male  who suffered an acute coronary syndrome on June 2016. He initially presented [place] where his ECG showed 2 mm of inferior ST segment elevation and a code STEMI was activated. He also had transient TIA symptomatology. Emergent catheterization revealed an ulcerated plaque in the distal RCA which had reperfused on anticoagulation therapy. There was no residual high-grade stenosis. PTCA alone was performed and the lesion was not stented. His other coronary arteries were normal. Left ventriculography showed normal LV function and a normal appearing aortic anatomy. He was continued on Angiomax and started on aspirin and Brilinta. An echo Doppler study showed an EF of 60-65% without wall motion abnormalities. There was grade 1 diastolic dysfunction. Subsequently, he had noted some occasional PVCs but these have stabilized.  He developed some recurrent chest pain symptomatology in September 2016 which led to a nuclear stress test was wasn't interpreted as intermediate risk and suggesting possible scar/ischemia inferolaterally. Repeat cardiac catheterization was done in 11/10/2014 and this revealed normal coronary arteries without evidence for restenosis in the distal RCA at the site of the prior ulcerated plaque.   Over the past year, he had continued to be stable. He denies chest pain, shortness of breath, or palpitations. He works as an Dispensing optician. I saw him in May 2017 for preoperative clearance prior to undergoing cervical disc surgery. He tolerated that well from a cardiovascular standpoint. He has remote history of suffering a DVT approximately 9 years ago and transiently was treated with Coumadin.  When I saw him for evaluationI wasconcerned about the possibility of sleep apnea.He underwent a sleep study which was done at [facility]. He was told of having sleep apnea. He subsequently was started on CPAP therapy and since initiating  therapy. He feels his sleep is significantly improved. He is sleeping on average at least 7-1/2-8 hours per night. He is unaware of breakthrough snoring. He admits to 100% compliance. He has a ResMed AirSense 10 unit with heated hose.  He presented to [facility] emergency room in September 2017 with somewhat different chest pain. At that time, he admitted that he was under significant stress that was work-related. Troponins and CT scan of his chest were negative. He has since changed jobs. He does the same type of work but works at home. He tries to be active and intermittently walks on a treadmill and does yard work.  He has been bothered by DJD of his thoracic back. He continues to use CPAP with 100% compliance and feels significantly better. He is on CPAP therapy at 13 cm water pressure continues to use this with 100% compliance.  Since I last saw him, he denies any recurrent episodes of chest pain, palpitations or shortness of breath. He does not routinely exercise. His job is sedentary. He is exercise has been limited by DJD and arthritis. He continues to use CPAP. His last laboratory was checked in December 2017 and at that time his LDL cholesterol was 47.  He was diagnosed with DVT based on venous Doppler on 07/31/2017 which revealed isolated calf DVT involving the posterior tibial vein. Hypercoagulable panel was ordered which came back negative. He was placed on 50-monthcourse of Xarelto then stop afterward. He has been followed by hematology/oncology service and was told  that if he was to have another episode of DVT, he will need lifelong treatment.  He was evaluated bt Almyra Deforest, PA on 04/17/2018 and was not longer on Xarelto.  He continued to have intermittent leg cramps worse with ambulation. On physical exam, currently had some difficulty in distal pulse detection and he underwent lower extremity Doppler study which showed normal ABIs.  During that evaluation, blood  pressure was elevated and valsartan was further titrated.  I last saw him in a telemedicine encounter on March 30, 2019.  At that time he admitted to being under increased stress and times during very stressful moments admits to rare palpitations.  He denies associated chest tightness or chest pressure.  He has a history of significant obesity and his weight had peaked at 298 pounds but he recently has lost 21 pounds over 4 months.  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  He also admits to mild blood pressure elevation.  He has been on CPAP therapy and had a diagnostic polysomnogram performed in [place] in September 2017 which showed severe sleep apnea with an AHI of 36.4/h and respiratory disturbance index of 36.8/h.  REM related AHI and RDI were 49.7/h.  There was severe positional sleep apnea in the supine position with an AHI of 89.4/h.  He has a ResMed air sense 10 CPAP unit with set up date November 30, 2015.  At a 13 cm set pressure, AHI is excellent at 0.2.  Compliance is excellent with 97% of usage days and average usage 7 hours and 33 minutes per night.  He is in need of a new DME company.  He presents for telemedicine evaluation.     Past Medical History:  Diagnosis Date  . CAD (coronary artery disease)    a. 07/2014 Inf STEMI/PTCA:  LM nl, LAD nl, LCX nl, RCA ulcerated plaque (PTCA only);  b. 10/2014 MV: intermediate risk, ? inflat isch/scar;  c. 10/2014 Cath: Nl cors w/o RCA restenosis.  . Diastolic dysfunction    a. 07/2014 Echo: EF 60-65%, Gr1 DD.  . DVT of lower extremity (deep venous thrombosis) ([place])    a. ~ 2009, per pt was on lovenox/coumadin  . GERD (gastroesophageal reflux disease)   . Hyperlipidemia   . Hypertension   . Hypertensive heart disease   . TIA (transient ischemic attack) 07/23/2014    Past Surgical History:  Procedure Laterality Date  . ARTHROPLASTY Right 07/09/2010  . CARDIAC CATHETERIZATION N/A 07/23/2014   Procedure: Left Heart Cath;   Surgeon: Troy Sine, MD;  Location: [place] [facility];  Service: Cardiovascular;  Laterality: N/A;  . CARDIAC CATHETERIZATION N/A 07/23/2014   Procedure: Coronary Balloon Angioplasty;  Surgeon: Troy Sine, MD;  Location: [place] [facility];  Service: Cardiovascular;  Laterality: N/A;  . CARDIAC CATHETERIZATION N/A 11/10/2014   Procedure: Left Heart Cath and Coronary Angiography;  Surgeon: Troy Sine, MD;  Location: [place] [facility];  Service: Cardiovascular;  Laterality: N/A;  . CERVICAL [place] SURGERY  2015  . JOINT REPLACEMENT Right    Right knee  . MENISCUS REPAIR Right 11/03/1995   Meniscus Lateral Tear: repaired by Dr. Sabra Heck at [facility]  . NASAL SINUS SURGERY     Deviated Septum  . REPLACEMENT TOTAL KNEE Right 2011   [facility]    Current Medications: Outpatient Medications Prior to Visit  Medication Sig Dispense Refill  . aspirin EC 81 MG EC tablet Take 1 tablet (  81 mg total) by mouth daily. (Patient taking differently: Take 81 mg by mouth at bedtime. )    . atorvastatin (LIPITOR) 80 MG tablet Take 1 tablet (80 mg total) by mouth daily. 90 tablet 3  . clopidogrel (PLAVIX) 75 MG tablet TAKE 1 TABLET BY MOUTH EVERY DAY 90 tablet 3  . metoprolol succinate (TOPROL XL) 50 MG 24 hr tablet Take 1 tablet (50 mg total) by mouth daily. Take with or immediately following a meal. 90 tablet 3  . nitroGLYCERIN (NITROSTAT) 0.4 MG SL tablet Place 1 tablet (0.4 mg total) under the tongue every 5 (five) minutes as needed for chest pain. 25 tablet 12  . sertraline (ZOLOFT) 100 MG tablet TAKE 1 TABLET BY MOUTH EVERY DAY 90 tablet 4  . valsartan (DIOVAN) 160 MG tablet TAKE 1 TABLET BY MOUTH EVERY DAY 90 tablet 3   No facility-administered medications prior to visit.     Allergies:   Patient has no known allergies.   Social History   Socioeconomic History  . Marital status: Married    Spouse name: Not on file  . Number of children: 2  . Years of education:  18  . Highest education level: Not on file  Occupational History  . Occupation: Employed    Comment: Works at [employer]  . Smoking status: Never Smoker  . Smokeless tobacco: Former Systems developer    Types: Chew  Substance and Sexual Activity  . Alcohol use: No    Alcohol/week: 1.0 standard drinks    Types: 1 Standard drinks or equivalent per week    Comment: social drinker  . Drug use: No  . Sexual activity: Yes  Other Topics Concern  . Not on file  Social History Narrative  . Not on file   Social Determinants of Health   Financial Resource Strain:   . Difficulty of Paying Living Expenses:   Food Insecurity:   . Worried About Charity fundraiser in the Last Year:   . Arboriculturist in the Last Year:   Transportation Needs:   . Film/video editor (Medical):   Marland Kitchen Lack of Transportation (Non-Medical):   Physical Activity:   . Days of Exercise per Week:   . Minutes of Exercise per Session:   Stress:   . Feeling of Stress :   Social Connections:   . Frequency of Communication with Friends and Family:   . Frequency of Social Gatherings with Friends and Family:   . Attends Religious Services:   . Active Member of Clubs or Organizations:   . Attends Archivist Meetings:   Marland Kitchen Marital Status:      Family History:  The patient's ***family history includes Arthritis in his mother and sister; Autoimmune disease in his father; Cancer in his maternal grandfather; Diabetes in his maternal grandfather and paternal grandfather; Diverticulitis in his mother; Gout in his father; Hypertension in his mother.   ROS General: Negative; No fevers, chills, or night sweats;  HEENT: Negative; No changes in vision or hearing, sinus congestion, difficulty swallowing Pulmonary: Negative; No cough, wheezing, shortness of breath, hemoptysis Cardiovascular: Negative; No chest pain, presyncope, syncope, palpitations GI: Negative; No nausea, vomiting, diarrhea, or abdominal  pain GU: Negative; No dysuria, hematuria, or difficulty voiding Musculoskeletal: Negative; no myalgias, joint pain, or weakness Hematologic/Oncology: Negative; no easy bruising, bleeding Endocrine: Negative; no heat/cold intolerance; no diabetes Neuro: Negative; no changes in balance, headaches Skin: Negative; No rashes or skin lesions Psychiatric: Negative;  No behavioral problems, depression Sleep: Negative; No snoring, daytime sleepiness, hypersomnolence, bruxism, restless legs, hypnogognic hallucinations, no cataplexy Other comprehensive 14 point system review is negative.   PHYSICAL EXAM:   VS:  BP 124/76   Pulse 69   Ht 5' 10"  (1.778 m)   Wt 288 lb 9.6 oz (130.9 kg)   SpO2 94%   BMI 41.41 kg/m    Wt Readings from Last 3 Encounters:  07/16/19 288 lb 9.6 oz (130.9 kg)  04/23/19 288 lb (130.6 kg)  03/30/19 277 lb (125.6 kg)    General: Alert, oriented, no distress.  Skin: normal turgor, no rashes, warm and dry HEENT: Normocephalic, atraumatic. Pupils equal round and reactive to light; sclera anicteric; extraocular muscles intact; Fundi ** Nose without nasal septal hypertrophy Mouth/Parynx benign; Mallinpatti scale Neck: No JVD, no carotid bruits; normal carotid upstroke Lungs: clear to ausculatation and percussion; no wheezing or rales Chest wall: without tenderness to palpitation Heart: PMI not displaced, RRR, s1 s2 normal, 1/6 systolic murmur, no diastolic murmur, no rubs, gallops, thrills, or heaves Abdomen: soft, nontender; no hepatosplenomehaly, BS+; abdominal aorta nontender and not dilated by palpation. Back: no CVA tenderness Pulses 2+ Musculoskeletal: full range of motion, normal strength, no joint deformities Extremities: no clubbing cyanosis or edema, Homan's sign negative  Neurologic: grossly nonfocal; Cranial nerves grossly wnl Psychologic: Normal mood and affect   Studies/Labs Reviewed:   EKG:  EKG is  ordered today.  ECG (independently read by me): Normal  sinus rhythm at 68 bpm, isolated PVC.  Minimal RV conduction delay.  Normal intervals.  February 2018 ECG (independently read by me): Normal sinus rhythm at 73 bpm.  Normal EKG.  Normal intervals.  November 2017 ECG (independently read by me): Sinus rhythm at 78 bpm.  Mild RV conduction delay.  Intervals normal.  May 2017 ECG (independently read by me): ECG from 07/12/2015.  As part of his preoperative screening showed normal sinus rhythm at 71 bpm.  There was incomplete right bundle branch block.  Left axis deviation.   Recent Labs: BMP Latest Ref Rng & Units 05/07/2019 10/08/2018 09/19/2017  Glucose 65 - 99 mg/dL 100(H) 102(H) 85  BUN 6 - 24 mg/dL 23 17 15   Creatinine 0.76 - 1.27 mg/dL 0.96 1.01 1.09  BUN/Creat Ratio 9 - 20 24(H) 17 14  Sodium 134 - 144 mmol/L 140 141 140  Potassium 3.5 - 5.2 mmol/L 4.6 4.7 4.5  Chloride 96 - 106 mmol/L 103 103 102  CO2 20 - 29 mmol/L 23 22 24   Calcium 8.7 - 10.2 mg/dL 9.7 9.6 9.8     Hepatic Function Latest Ref Rng & Units 05/07/2019 10/08/2018 09/19/2017  Total Protein 6.0 - 8.5 g/dL 7.2 6.7 7.0  Albumin 4.0 - 5.0 g/dL 4.5 4.2 4.4  AST 0 - 40 IU/L 29 32 28  ALT 0 - 44 IU/L 47(H) 46(H) 46(H)  Alk Phosphatase 39 - 117 IU/L 85 81 77  Total Bilirubin 0.0 - 1.2 mg/dL 0.2 0.2 0.2  Bilirubin, Direct 0.1 - 0.5 mg/dL - - -    CBC Latest Ref Rng & Units 10/08/2018 09/19/2017 11/20/2016  WBC 3.4 - 10.8 x10E3/uL 8.1 8.0 7.9  Hemoglobin 13.0 - 17.7 g/dL 14.8 14.4 14.5  Hematocrit 37.5 - 51.0 % 44.6 44.8 41.5  Platelets 150 - 450 x10E3/uL 321 321 299   Lab Results  Component Value Date   MCV 90 10/08/2018   MCV 91 09/19/2017   MCV 88 11/20/2016   Lab Results  Component  Value Date   TSH 2.670 10/08/2018   Lab Results  Component Value Date   HGBA1C 5.8 (H) 07/24/2014     BNP No results found for: BNP  ProBNP No results found for: PROBNP   Lipid Panel     Component Value Date/Time   CHOL 122 05/07/2019 0938   TRIG 150 (H) 05/07/2019 0938    HDL 30 (L) 05/07/2019 0938   CHOLHDL 4.1 05/07/2019 0938   CHOLHDL 3.9 02/06/2016 1058   VLDL 30 02/06/2016 1058   LDLCALC 66 05/07/2019 0938   LABVLDL 26 05/07/2019 0938     RADIOLOGY: No results found.   Additional studies/ records that were reviewed today include:  ***    ASSESSMENT:    No diagnosis found.   PLAN:  ***   Medication Adjustments/Labs and Tests Ordered: Current medicines are reviewed at length with the patient today.  Concerns regarding medicines are outlined above.  Medication changes, Labs and Tests ordered today are listed in the Patient Instructions below. There are no Patient Instructions on file for this visit.   Signed, Shelva Majestic, MD  07/16/2019 1:56 PM    [facility] [address] [place], [place], [place]  [address] Phone: [phone]

## 2019-07-18 ENCOUNTER — Encounter: Payer: Self-pay | Admitting: Cardiovascular Disease

## 2019-07-18 NOTE — Progress Notes (Addendum)
Cardiology Office Note    Date:  07/18/2019   ID:  Eduardo Poole, Eduardo Poole December 23, 1970, MRN [id number]  PCP:  Birdie Sons, MD  Cardiologist:  Shelva Majestic, MD   3 1/2 month F/U evaluation  History of Present Illness:  Eduardo Poole is a 49 y.o. male who suffered an acute coronary syndrome on June 2016. He initially presented [place] where his ECG showed 2 mm of inferior ST segment elevation and a code STEMI was activated. He also had transient TIA symptomatology. Emergent catheterization revealed an ulcerated plaque in the distal RCA which had reperfused on anticoagulation therapy. There was no residual high-grade stenosis. PTCA alone was performed and the lesion was not stented. His other coronary arteries were normal. Left ventriculography showed normal LV function and a normal appearing aortic anatomy. He was continued on Angiomax and started on aspirin and Brilinta. An echo Doppler study showed an EF of 60-65% without wall motion abnormalities. There was grade 1 diastolic dysfunction. Subsequently, he had noted some occasional PVCs but these have stabilized.  He developed some recurrent chest pain symptomatology in September 2016 which led to a nuclear stress test was wasn't interpreted as intermediate risk and suggesting possible scar/ischemia inferolaterally. Repeat cardiac catheterization was done in 11/10/2014 and this revealed normal coronary arteries without evidence for restenosis in the distal RCA at the site of the prior ulcerated plaque.   When I saw him in 2017 he continued to be stable. He denies chest pain, shortness of breath, or palpitations. He works as an Dispensing optician. I saw him in May 2017 for preoperative clearance prior to undergoing cervical disc surgery. He tolerated that well from a cardiovascular standpoint. He has remote history of suffering a DVT approximately 9 years ago and transiently was treated with Coumadin.  I  wasconcerned about the possibility of sleep apnea.He underwent a sleep study which was done at [facility]. He was told of having sleep apnea. He subsequently was started on CPAP therapy and since initiating therapy. He feels his sleep is significantly improved. He is sleeping on average at least 7-1/2-8 hours per night. He is unaware of breakthrough snoring. He admits to 100% compliance. He has a ResMed AirSense 10 unit with heated hose.  He presented to [facility] emergency room in September 2017 with somewhat different chest pain. At that time, he admitted that he was under significant stress that was work-related. Troponins and CT scan of his chest were negative. He has since changed jobs. He does the same type of work but works at home. He tries to be active and intermittently walks on a treadmill and does yard work.  He has been bothered by DJD of his thoracic back. He continues to use CPAP with 100% compliance and feels significantly better. He is on CPAP therapy at 13 cm water pressure continues to use this with 100% compliance.  Since I last saw him, he denies any recurrent episodes of chest pain, palpitations or shortness of breath. He does not routinely exercise. His job is sedentary. He is exercise has been limited by DJD and arthritis. He continues to use CPAP. His last laboratory was checked in December 2017 and at that time his LDL cholesterol was 47.  He was diagnosed with DVT based on venous Doppler on 07/31/2017 which revealed isolated calf DVT involving the posterior tibial vein. Hypercoagulable panel was ordered which came back negative. He was placed on 19-monthcourse of Xarelto then stop afterward.  He has been followed by hematology/oncology service and was told that if he was to have another episode of DVT, he will need lifelong treatment.  He was evaluated bt Almyra Deforest, PA on 04/17/2018 and was not longer on Xarelto.  He continued to  have intermittent leg cramps worse with ambulation. On physical exam, currently had some difficulty in distal pulse detection and he underwent lower extremity Doppler study which showed normal ABIs.  During that evaluation, blood pressure was elevated and valsartan was further titrated.  He was last evaluated by me in a telemedicine encounter on March 30, 2019.  At that time he admitted to be under  increased stress and times during very stressful moments admits to rare palpitations.  He denies associated chest tightness or chest pressure.  He has a history of significant obesity and his weight had peaked at 298 pounds but he recently has lost 21 pounds over 4 months.  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  He also admits to mild blood pressure elevation.  He has been on CPAP therapy and had a diagnostic polysomnogram performed in [place] in September 2017 which showed severe sleep apnea with an AHI of 36.4/h and respiratory disturbance index of 36.8/h.  REM related AHI and RDI were 49.7/h.  There was severe positional sleep apnea in the supine position with an AHI of 89.4/h.  He has a ResMed air sense 10 CPAP unit with set up date November 30, 2015.  At a 13 cm set pressure, AHI is excellent at 0.2.  Compliance is excellent with 97% of usage days and average usage 7 hours and 33 minutes per night.  He is in need of a new DME company.  During that evaluation he was in need for establishment with a new DME company in [place].  With his palpitations I recommended changing from metoprolol tartrate to metoprolol succinate 50 mg daily.  Over the last 3-1/2 months, he continues to be busy at work.  He denies chest pain or shortness of breath.  Palpitations have improved.  He is now on aspirin and Plavix, metoprolol succinate 50 mg daily and valsartan 160 mg for hypertension.  He is on atorvastatin 80 mg for hyperlipidemia and sertraline for anxiety.  I obtained a new download in the office  today for 06/16/2019 through Jul 15, 2019.  He is 100% compliant with average usage 7 hours and 14 minutes.  At 13 cm water pressure AHI is excellent at 0.2.  He believes his DME company is [employer].  He has had consistent issues with his full facemask leak over the bridge of the nose.  He presents for evaluation.   Past Medical History:  Diagnosis Date  . CAD (coronary artery disease)    a. 07/2014 Inf STEMI/PTCA:  LM nl, LAD nl, LCX nl, RCA ulcerated plaque (PTCA only);  b. 10/2014 MV: intermediate risk, ? inflat isch/scar;  c. 10/2014 Cath: Nl cors w/o RCA restenosis.  . Diastolic dysfunction    a. 07/2014 Echo: EF 60-65%, Gr1 DD.  . DVT of lower extremity (deep venous thrombosis) ([place])    a. ~ 2009, per pt was on lovenox/coumadin  . GERD (gastroesophageal reflux disease)   . Hyperlipidemia   . Hypertension   . Hypertensive heart disease   . TIA (transient ischemic attack) 07/23/2014    Past Surgical History:  Procedure Laterality Date  . ARTHROPLASTY Right 07/09/2010  . CARDIAC CATHETERIZATION N/A 07/23/2014   Procedure: Left Heart Cath;  Surgeon: Troy Sine, MD;  Location: [facility];  Service: Cardiovascular;  Laterality: N/A;  . CARDIAC CATHETERIZATION N/A 07/23/2014   Procedure: Coronary Balloon Angioplasty;  Surgeon: Troy Sine, MD;  Location: [facility];  Service: Cardiovascular;  Laterality: N/A;  . CARDIAC CATHETERIZATION N/A 11/10/2014   Procedure: Left Heart Cath and Coronary Angiography;  Surgeon: Troy Sine, MD;  Location: [facility];  Service: Cardiovascular;  Laterality: N/A;  . CERVICAL [place] SURGERY  2015  . JOINT REPLACEMENT Right    Right knee  . MENISCUS REPAIR Right 11/03/1995   Meniscus Lateral Tear: repaired [facility] Dr. Sabra Heck [facility]  . NASAL SINUS SURGERY     Deviated Septum  . REPLACEMENT TOTAL KNEE Right 2011   [facility]    Current Medications: Outpatient Medications Prior to Visit  Medication Sig  Dispense Refill  . aspirin EC 81 MG EC tablet Take 1 tablet (81 mg total) by mouth daily. (Patient taking differently: Take 81 mg by mouth at bedtime. )    . atorvastatin (LIPITOR) 80 MG tablet Take 1 tablet (80 mg total) by mouth daily. 90 tablet 3  . clopidogrel (PLAVIX) 75 MG tablet TAKE 1 TABLET BY MOUTH EVERY DAY 90 tablet 3  . metoprolol succinate (TOPROL XL) 50 MG 24 hr tablet Take 1 tablet (50 mg total) by mouth daily. Take with or immediately following a meal. 90 tablet 3  . nitroGLYCERIN (NITROSTAT) 0.4 MG SL tablet Place 1 tablet (0.4 mg total) under the tongue every 5 (five) minutes as needed for chest pain. 25 tablet 12  . sertraline (ZOLOFT) 100 MG tablet TAKE 1 TABLET BY MOUTH EVERY DAY 90 tablet 4  . valsartan (DIOVAN) 160 MG tablet TAKE 1 TABLET BY MOUTH EVERY DAY 90 tablet 3   No facility-administered medications prior to visit.     Allergies:   Patient has no known allergies.   Social History   Socioeconomic History  . Marital status: Married    Spouse name: Not on file  . Number of children: 2  . Years of education: 27  . Highest education level: Not on file  Occupational History  . Occupation: Employed    Comment: Works at [employer]  . Smoking status: Never Smoker  . Smokeless tobacco: Former Systems developer    Types: Chew  Substance and Sexual Activity  . Alcohol use: No    Alcohol/week: 1.0 standard drinks    Types: 1 Standard drinks or equivalent per week    Comment: social drinker  . Drug use: No  . Sexual activity: Yes  Other Topics Concern  . Not on file  Social History Narrative  . Not on file   Social Determinants of Health   Financial Resource Strain:   . Difficulty of Paying Living Expenses:   Food Insecurity:   . Worried About Charity fundraiser in the Last Year:   . Arboriculturist in the Last Year:   Transportation Needs:   . Film/video editor (Medical):   Marland Kitchen Lack of Transportation (Non-Medical):   Physical Activity:    . Days of Exercise per Week:   . Minutes of Exercise per Session:   Stress:   . Feeling of Stress :   Social Connections:   . Frequency of Communication with Friends and Family:   . Frequency of Social Gatherings with Friends and Family:   . Attends Religious Services:   .  Active Member of Clubs or Organizations:   . Attends Archivist Meetings:   Marland Kitchen Marital Status:      Family History:  The patient's family history includes Arthritis in his mother and sister; Autoimmune disease in his father; Cancer in his maternal grandfather; Diabetes in his maternal grandfather and paternal grandfather; Diverticulitis in his mother; Gout in his father; Hypertension in his mother.   ROS General: Negative; No fevers, chills, or night sweats;  HEENT: Negative; No changes in vision or hearing, sinus congestion, difficulty swallowing Pulmonary: Negative; No cough, wheezing, shortness of breath, hemoptysis Cardiovascular: Negative; No chest pain, presyncope, syncope, palpitations GI: Negative; No nausea, vomiting, diarrhea, or abdominal pain GU: Negative; No dysuria, hematuria, or difficulty voiding Musculoskeletal: Negative; no myalgias, joint pain, or weakness Hematologic/Oncology: Negative; no easy bruising, bleeding Endocrine: Negative; no heat/cold intolerance; no diabetes Neuro: Negative; no changes in balance, headaches Skin: Negative; No rashes or skin lesions Psychiatric: Negative; No behavioral problems, depression Sleep: Negative; No snoring, daytime sleepiness, hypersomnolence, bruxism, restless legs, hypnogognic hallucinations, no cataplexy Other comprehensive 14 point system review is negative.   PHYSICAL EXAM:   VS:  BP 124/76   Pulse 69   Ht _0  (1.778 m)   Wt 288 lb 9.6 oz (130.9 kg)   SpO2 94%   BMI 41.41 kg/m     Repeat blood pressure by me was 136/84  Wt Readings from Last 3 Encounters:  07/16/19 288 lb 9.6 oz (130.9 kg)  04/23/19 288 lb (130.6 kg)   03/30/19 277 lb (125.6 kg)    General: Alert, oriented, no distress.  Skin: normal turgor, no rashes, warm and dry HEENT: Normocephalic, atraumatic. Pupils equal round and reactive to light; sclera anicteric; extraocular muscles intact;  Nose without nasal septal hypertrophy Mouth/Parynx benign; Mallinpatti scale 3 Neck: No JVD, no carotid bruits; normal carotid upstroke Lungs: clear to ausculatation and percussion; no wheezing or rales Chest wall: without tenderness to palpitation Heart: PMI not displaced, RRR, s1 s2 normal, 1/6 systolic murmur, no diastolic murmur, no rubs, gallops, thrills, or heaves Abdomen: soft, nontender; no hepatosplenomehaly, BS+; abdominal aorta nontender and not dilated by palpation. Back: no CVA tenderness Pulses 2+ Musculoskeletal: full range of motion, normal strength, no joint deformities Extremities: no clubbing cyanosis or edema, Homan's sign negative  Neurologic: grossly nonfocal; Cranial nerves grossly wnl Psychologic: Normal mood and affect   Studies/Labs Reviewed:   EKG:  EKG is ordered today. ECG (independently read by me): Sinus rhythm at 68 bpm, isolated PVC, mild RV conduction delay.  Recent Labs: BMP Latest Ref Rng & Units 05/07/2019 10/08/2018 09/19/2017  Glucose 65 - 99 mg/dL 100(H) 102(H) 85  BUN 6 - 24 mg/dL _1 Creatinine 0.76 - 1.27 mg/dL 0.96 1.01 1.09  BUN/Creat Ratio 9 - 20 24(H) 17 14  Sodium 134 - 144 mmol/L 140 141 140  Potassium 3.5 - 5.2 mmol/L 4.6 4.7 4.5  Chloride 96 - 106 mmol/L 103 103 102  CO2 20 - 29 mmol/L _2 Calcium 8.7 - 10.2 mg/dL 9.7 9.6 9.8     Hepatic Function Latest Ref Rng & Units 05/07/2019 10/08/2018 09/19/2017  Total Protein 6.0 - 8.5 g/dL 7.2 6.7 7.0  Albumin 4.0 - 5.0 g/dL 4.5 4.2 4.4  AST 0 - 40 IU/L 29 32 28  ALT 0 - 44 IU/L 47(H) 46(H) 46(H)  Alk Phosphatase 39 - 117 IU/L 85 81 77  Total Bilirubin 0.0 - 1.2 mg/dL 0.2 0.2 0.2  Bilirubin, Direct 0.1 - 0.5 mg/dL - - -    CBC Latest Ref  Rng & Units 10/08/2018 09/19/2017 11/20/2016  WBC 3.4 - 10.8 x10E3/uL 8.1 8.0 7.9  Hemoglobin 13.0 - 17.7 g/dL 14.8 14.4 14.5  Hematocrit 37.5 - 51.0 % 44.6 44.8 41.5  Platelets 150 - 450 x10E3/uL 321 321 299   Lab Results  Component Value Date   MCV 90 10/08/2018   MCV 91 09/19/2017   MCV 88 11/20/2016   Lab Results  Component Value Date   TSH 2.670 10/08/2018   Lab Results  Component Value Date   HGBA1C 5.8 (H) 07/24/2014     BNP No results found for: BNP  ProBNP No results found for: PROBNP   Lipid Panel     Component Value Date/Time   CHOL 122 05/07/2019 0938   TRIG 150 (H) 05/07/2019 0938   HDL 30 (L) 05/07/2019 0938   CHOLHDL 4.1 05/07/2019 0938   CHOLHDL 3.9 02/06/2016 1058   VLDL 30 02/06/2016 1058   LDLCALC 66 05/07/2019 0938   LABVLDL 26 05/07/2019 0938     RADIOLOGY: No results found.   Additional studies/ records that were reviewed today include:  I obtained a new download of his CPAP use from April 28 through Jul 15, 2019.  ASSESSMENT:    1. Coronary artery disease involving native coronary artery of native heart without angina pectoris   2. Essential hypertension   3. Hypertensive heart disease without heart failure   4. OSA (obstructive sleep apnea)   5. Hyperlipidemia LDL goal <70   6. Palpitations    PLAN:   1. CAD: Mr. Scheff suffered an acute coronary syndrome in June 2016 when he presented with 2 mm inferior ST elevation due to ulcerated plaque in his distal RCA and had reperfusion anticoagulation therapy.  PTCA alone was performed with excellent result.  Repeat catheterization in September 2016 revealed normal coronary arteries without restenosis.  He has not had any recurrent anginal symptoms.  2.  Essential hypertension: He continues to be on metoprolol succinate 50 mg and valsartan 160 mg daily.  Repeat blood pressure by me was 136/84.  I have recommended slight titration of valsartan up to 240 mg daily.  3.  OSA: He continues to  use CPAP with excellent compliance.  A new download was obtained from April 28 through Jul 15, 2019 which shows 100% usage and average usage at 7 hours and 14 minutes.  At 13 cm water pressure AHI is excellent at 0.2.  He does have significant mask leak with his current full facemask.  I have recommended he try the new ResMed air fit F 30i mask which I think he will like much better and should not have leak since this is under the nose and there is no part of the mask on the bridge of his nose.  DME company is now at [place].  4.  DVT: Resolved, no longer on anticoagulation therapy.  5.  Palpitations: Improved with increase metoprolol succinate at 50 mg daily.  6.  Hyperlipidemia: Target LDL less than 70.  He continues to be on atorvastatin 80 mg and is tolerating this well.  LDL cholesterol August 2020 was 66.  7.  Obesity: He is now morbidly obese with BMI at 41.4.  Weight loss and increased exercise was recommended.   Medication Adjustments/Labs and Tests Ordered: Current medicines are reviewed at length with the patient today.  Concerns regarding medicines are outlined above.  Medication changes, Labs and  Tests ordered today are listed in the Patient Instructions below. Patient Instructions  Medication Instructions:  INCREASE VALSARTAN TO 240MG DAILY (1.5 TABS *If you need a refill on your cardiac medications before your next appointment, please call your pharmacy*   Follow-Up: At [facility], you and your health needs are our priority.  As part of our continuing mission to provide you with exceptional heart care, we have created designated Provider Care Teams.  These Care Teams include your primary Cardiologist (physician) and Advanced Practice Providers (APPs -  Physician Assistants and Nurse Practitioners) who all work together to provide you with the care you need, when you need it.  We recommend signing up for the patient portal called "MyChart".  Sign up information is provided on  this After Visit Summary.  MyChart is used to connect with patients for Virtual Visits (Telemedicine).  Patients are able to view lab/test results, encounter notes, upcoming appointments, etc.  Non-urgent messages can be sent to your provider as well.   To learn more about what you can do with MyChart, go to [email or web address].    Your next appointment:   6 month(s)  The format for your next appointment:   In Person  Provider:   Shelva Majestic, MD        Signed, Shelva Majestic, MD  07/18/2019 8:52 PM    [facility] [address] [place], [place], [place]  [address] Phone: [phone]

## 2019-08-03 ENCOUNTER — Other Ambulatory Visit: Payer: Self-pay | Admitting: Cardiovascular Disease

## 2019-08-03 NOTE — Telephone Encounter (Signed)
*  STAT* If patient is at the pharmacy, call can be transferred to refill team.   1. Which medications need to be refilled? (please list name of each medication and dose if known) atorvastatin (LIPITOR) 80 MG tablet  2. Which pharmacy/location (including street and city if local pharmacy) is medication to be sent to? [id number] - [place], [place] - [address]  3. Do they need a 30 day or 90 day supply? 90   Patient out of medication.

## 2019-08-04 MED ORDER — ATORVASTATIN CALCIUM 80 MG PO TABS: 80.0000 mg | ORAL_TABLET | Freq: Every day | ORAL | 1 refills | Status: DC

## 2019-08-06 ENCOUNTER — Telehealth: Payer: Self-pay | Admitting: *Deleted

## 2019-08-06 ENCOUNTER — Other Ambulatory Visit: Payer: Self-pay | Admitting: Cardiovascular Disease

## 2019-08-06 DIAGNOSIS — G4733 Obstructive sleep apnea (adult) (pediatric): Secondary | ICD-10-CM

## 2019-08-06 NOTE — Telephone Encounter (Signed)
Order for ResMed F-30i mask submitted to [employer] via Epic portal.

## 2019-09-21 ENCOUNTER — Telehealth: Payer: Self-pay

## 2019-09-21 NOTE — Telephone Encounter (Signed)
He can have the 9am on August 24th or September 28th. Just let him know not to come early since we are scheduled for a staff meeting that might not be over until 9am.

## 2019-09-21 NOTE — Telephone Encounter (Signed)
Attempted to contact patient, no answer left a voicemail. Okay for PEC to advise patient and schedule CPE on one of the dates listed below.

## 2019-09-21 NOTE — Telephone Encounter (Signed)
Copied from [facility] [phone]. Topic: Appointment Scheduling - Scheduling Inquiry for Clinic >> Sep 21, 2019 10:20 AM Eduardo Poole D wrote: Reason for CRM: Patients wife stated he needs to be seen before 11/18/2019 for physical and lab work so they can be eligible for  a reduced price in health insurance. Current appointment is 12/21/2019, which was changed by practice. Please call wife back with resolution.

## 2019-09-24 NOTE — Telephone Encounter (Signed)
I called patient and changed appointment to 9am on 10/12/2019.

## 2019-10-08 ENCOUNTER — Encounter: Payer: BC Managed Care – PPO | Admitting: Family Medicine

## 2019-10-12 ENCOUNTER — Ambulatory Visit: Payer: BC Managed Care – PPO | Admitting: Family Medicine

## 2019-10-12 ENCOUNTER — Encounter: Payer: Self-pay | Admitting: Family Medicine

## 2019-10-12 ENCOUNTER — Other Ambulatory Visit: Payer: Self-pay

## 2019-10-12 ENCOUNTER — Ambulatory Visit (INDEPENDENT_AMBULATORY_CARE_PROVIDER_SITE_OTHER): Payer: BC Managed Care – PPO | Admitting: Family Medicine

## 2019-10-12 VITALS — BP 130/77 | HR 66 | Temp 98.2°F | Resp 16 | Ht 70.5 in | Wt 294.0 lb

## 2019-10-12 DIAGNOSIS — E7800 Pure hypercholesterolemia, unspecified: Secondary | ICD-10-CM

## 2019-10-12 DIAGNOSIS — I119 Hypertensive heart disease without heart failure: Secondary | ICD-10-CM

## 2019-10-12 DIAGNOSIS — F329 Major depressive disorder, single episode, unspecified: Secondary | ICD-10-CM | POA: Diagnosis not present

## 2019-10-12 DIAGNOSIS — Z0000 Encounter for general adult medical examination without abnormal findings: Secondary | ICD-10-CM

## 2019-10-12 DIAGNOSIS — Z1159 Encounter for screening for other viral diseases: Secondary | ICD-10-CM | POA: Diagnosis not present

## 2019-10-12 DIAGNOSIS — H9202 Otalgia, left ear: Secondary | ICD-10-CM

## 2019-10-12 DIAGNOSIS — F32A Depression, unspecified: Secondary | ICD-10-CM

## 2019-10-12 DIAGNOSIS — Z86010 Personal history of colonic polyps: Secondary | ICD-10-CM | POA: Diagnosis not present

## 2019-10-12 DIAGNOSIS — M5030 Other cervical disc degeneration, unspecified cervical region: Secondary | ICD-10-CM

## 2019-10-12 DIAGNOSIS — M5136 Other intervertebral disc degeneration, lumbar region: Secondary | ICD-10-CM

## 2019-10-12 DIAGNOSIS — Z23 Encounter for immunization: Secondary | ICD-10-CM

## 2019-10-12 DIAGNOSIS — G4733 Obstructive sleep apnea (adult) (pediatric): Secondary | ICD-10-CM

## 2019-10-12 NOTE — Progress Notes (Signed)
Complete physical exam   Patient: Eduardo Poole   DOB: 1971-01-03   49 y.o. Male  MRN: [id number] Visit Date: 10/12/2019  Today's healthcare provider: Lelon Huh, MD   Chief Complaint  Patient presents with   Annual Exam   Hyperlipidemia   Hypertension   Subjective    Eduardo Poole is a 49 y.o. male who presents today for a complete physical exam.  He reports consuming a general diet. The patient does not participate in regular exercise at present. He generally feels fairly well. He reports sleeping fairly well. He does have additional problems to discuss today.  HPI  Lipid/Cholesterol, Follow-up  Last lipid panel Other pertinent labs  Lab Results  Component Value Date   CHOL 122 05/07/2019   HDL 30 (L) 05/07/2019   LDLCALC 66 05/07/2019   TRIG 150 (H) 05/07/2019   CHOLHDL 4.1 05/07/2019   Lab Results  Component Value Date   ALT 47 (H) 05/07/2019   AST 29 05/07/2019   PLT 321 10/08/2018   TSH 2.670 10/08/2018     He was last seen for this 5 months ago.   Management since that visit includes continuing same medications.  He reports good compliance with treatment. He is not having side effects.   Symptoms: No chest pain No chest pressure/discomfort  No dyspnea No lower extremity edema  No numbness or tingling of extremity No orthopnea  No palpitations No paroxysmal nocturnal dyspnea  No speech difficulty No syncope   Current diet: well balanced Current exercise: none  The ASCVD Risk score (Celoron., et al., 2013) failed to calculate for the following reasons:   The patient has a prior MI or stroke diagnosis  ---------------------------------------------------------------------------------------------------  Hypertension, follow-up  BP Readings from Last 3 Encounters:  10/12/19 130/77  07/16/19 124/76  04/23/19 (!) 158/85   Wt Readings from Last 3 Encounters:  10/12/19 294 lb (133.4 kg)  07/16/19 288 lb 9.6 oz (130.9 kg)  04/23/19  288 lb (130.6 kg)     He was last seen for hypertension 1 years ago.  BP at that visit was 142/98. Management since that visit includes advising patient to take Valsartan twice daily as prescribed.  He reports good compliance with treatment. He is not having side effects.  He is following a Regular diet. He is not exercising. He does not smoke.  Use of agents associated with hypertension: NSAIDS.   Outside blood pressures are not checked. Symptoms: No chest pain No chest pressure  No palpitations No syncope  No dyspnea No orthopnea  No paroxysmal nocturnal dyspnea No lower extremity edema   Pertinent labs: Lab Results  Component Value Date   CHOL 122 05/07/2019   HDL 30 (L) 05/07/2019   LDLCALC 66 05/07/2019   TRIG 150 (H) 05/07/2019   CHOLHDL 4.1 05/07/2019   Lab Results  Component Value Date   NA 140 05/07/2019   K 4.6 05/07/2019   CREATININE 0.96 05/07/2019   GFRNONAA 92 05/07/2019   GFRAA 107 05/07/2019   GLUCOSE 100 (H) 05/07/2019     The ASCVD Risk score (Goff DC Jr., et al., 2013) failed to calculate for the following reasons:   The patient has a prior MI or stroke diagnosis   ---------------------------------------------------------------------------------------------------  Follow up for OSA:  The patient was last seen for this 1 year ago. Changes made at last visit include ordering overnight oximetry.  He reports good compliance with treatment. Patient uses CPAP machine every night.  He feels that condition is Improved. He is not having side effects.   -----------------------------------------------------------------------------------------   Past Medical History:  Diagnosis Date   CAD (coronary artery disease)    a. 07/2014 Inf STEMI/PTCA:  LM nl, LAD nl, LCX nl, RCA ulcerated plaque (PTCA only);  b. 10/2014 MV: intermediate risk, ? inflat isch/scar;  c. 10/2014 Cath: Nl cors w/o RCA restenosis.   Diastolic dysfunction    a. 07/2014 Echo: EF  60-65%, Gr1 DD.   DVT of lower extremity (deep venous thrombosis) ([place])    a. ~ 2009, per pt was on lovenox/coumadin   GERD (gastroesophageal reflux disease)    Hyperlipidemia    Hypertension    Hypertensive heart disease    TIA (transient ischemic attack) 07/23/2014   Past Surgical History:  Procedure Laterality Date   ARTHROPLASTY Right 07/09/2010   CARDIAC CATHETERIZATION N/A 07/23/2014   Procedure: Left Heart Cath;  Surgeon: Troy Sine, MD;  Location: [place];  Service: Cardiovascular;  Laterality: N/A;   CARDIAC CATHETERIZATION N/A 07/23/2014   Procedure: Coronary Balloon Angioplasty;  Surgeon: Troy Sine, MD;  Location: [place] [facility];  Service: Cardiovascular;  Laterality: N/A;   CARDIAC CATHETERIZATION N/A 11/10/2014   Procedure: Left Heart Cath and Coronary Angiography;  Surgeon: Troy Sine, MD;  Location: [place] [facility] [facility];  Service: Cardiovascular;  Laterality: N/A;   CERVICAL DISC SURGERY  2015   JOINT REPLACEMENT Right    Right knee   MENISCUS REPAIR Right 11/03/1995   Meniscus Lateral Tear: repaired by Dr. Sabra Heck at [facility]   REPLACEMENT TOTAL KNEE Right 2011   [facility]   Social History   Socioeconomic History   Marital status: Married    Spouse name: Not on file   Number of children: 2   Years of education: 12   Highest education level: Not on file  Occupational History   Occupation: Employed    Comment: Works at [employer] Use   Smoking status: Never Smoker   Smokeless tobacco: Former Systems developer    Types: Nurse, children's Use: Never used  Substance and Sexual Activity   Alcohol use: No    Alcohol/week: 1.0 standard drink    Types: 1 Standard drinks or equivalent per week    Comment: social drinker   Drug use: No   Sexual activity: Yes  Other Topics Concern   Not on file  Social History Narrative   Not on file     Social Determinants of Health   Financial Resource Strain:    Difficulty of Paying Living Expenses: Not on file  Food Insecurity:    Worried About Charity fundraiser in the Last Year: Not on file   [place] in the Last Year: Not on file  Transportation Needs:    Lack of Transportation (Medical): Not on file   Lack of Transportation (Non-Medical): Not on file  Physical Activity:    Days of Exercise per Week: Not on file   Minutes of Exercise per Session: Not on file  Stress:    Feeling of Stress : Not on file  Social Connections:    Frequency of Communication with Friends and Family: Not on file   Frequency of Social Gatherings with Friends and Family: Not on file   Attends Religious Services: Not on file   Active Member of Clubs  or Organizations: Not on file   Attends Archivist Meetings: Not on file   Marital Status: Not on file  Intimate Partner Violence:    Fear of Current or Ex-Partner: Not on file   Emotionally Abused: Not on file   Physically Abused: Not on file   Sexually Abused: Not on file   Family Status  Relation Name Status   Mother  Alive   Father  Alive   Sister  Alive   PGF  (Not Specified)   MGF  Deceased   Family History  Problem Relation Age of Onset   Hypertension Mother    Arthritis Mother    Diverticulitis Mother    Gout Father    Autoimmune disease Father    Arthritis Sister    Diabetes Paternal Grandfather    Diabetes Maternal Grandfather    Cancer Maternal Grandfather        unknown   No Known Allergies  Patient Care Team: Birdie Sons, MD as PCP - General (Family Medicine) Troy Sine, MD as PCP - Cardiology (Cardiology) Burnis Kingfisher, MD as Referring Physician (Orthopedic Surgery) Beverly Gust, MD (Unknown Physician Specialty) Troy Sine, MD as Consulting Physician (Cardiology)   Medications: Outpatient Medications Prior to Visit  Medication Sig   aspirin EC 81  MG EC tablet Take 1 tablet (81 mg total) by mouth daily. (Patient taking differently: Take 81 mg by mouth at bedtime. )   atorvastatin (LIPITOR) 80 MG tablet Take 1 tablet (80 mg total) by mouth daily.   clopidogrel (PLAVIX) 75 MG tablet TAKE 1 TABLET BY MOUTH EVERY DAY   metoprolol succinate (TOPROL XL) 50 MG 24 hr tablet Take 1 tablet (50 mg total) by mouth daily. Take with or immediately following a meal.   nitroGLYCERIN (NITROSTAT) 0.4 MG SL tablet Place 1 tablet (0.4 mg total) under the tongue every 5 (five) minutes as needed for chest pain.   sertraline (ZOLOFT) 100 MG tablet TAKE 1 TABLET BY MOUTH EVERY DAY   valsartan (DIOVAN) 160 MG tablet Take 1.5 tablets (240 mg total) by mouth daily.   No facility-administered medications prior to visit.    Review of Systems  Constitutional: Positive for diaphoresis and fatigue. Negative for appetite change, chills and fever.  HENT: Positive for congestion, ear pain, nosebleeds, postnasal drip, sinus pressure and tinnitus. Negative for hearing loss, sneezing and trouble swallowing.   Eyes: Positive for itching. Negative for pain and visual disturbance.  Respiratory: Negative for cough, chest tightness and shortness of breath.   Cardiovascular: Negative for chest pain, palpitations and leg swelling.  Gastrointestinal: Negative for abdominal pain, blood in stool, constipation, diarrhea, nausea and vomiting.  Endocrine: Positive for polyphagia. Negative for polydipsia and polyuria.  Genitourinary: Negative for dysuria and flank pain.  Musculoskeletal: Positive for arthralgias, back pain, joint swelling, neck pain and neck stiffness. Negative for myalgias.  Skin: Negative for color change, rash and wound.  Neurological: Negative for dizziness, tremors, seizures, speech difficulty, weakness, light-headedness and headaches.  Psychiatric/Behavioral: Positive for agitation and decreased concentration. Negative for behavioral problems, confusion,  dysphoric mood and sleep disturbance. The patient is not nervous/anxious.   All other systems reviewed and are negative.     Objective    BP 130/77 (BP Location: Left Arm)    Pulse 66    Temp 98.2 F (36.8 C) (Oral)    Resp 16    Ht 5' 10.5" (1.791 m)    Wt 294 lb (133.4 kg)  BMI 41.59 kg/m    Physical Exam   General Appearance:    Obese male. Alert, cooperative, in no acute distress, appears stated age  Head:    Normocephalic, without obvious abnormality, atraumatic  Eyes:    PERRL, conjunctiva/corneas clear, EOM's intact, fundi    benign, both eyes       Ears:    Normal TM's and external ear canals, both ears  Neck:   Supple, symmetrical, trachea midline, no adenopathy;       thyroid:  No enlargement/tenderness/nodules; no carotid   bruit or JVD  Back:     Symmetric, no curvature, ROM normal, no CVA tenderness  Lungs:     Clear to auscultation bilaterally, respirations unlabored  Chest wall:    No tenderness or deformity  Heart:    Normal heart rate. Normal rhythm. No murmurs, rubs, or gallops.  S1 and S2 normal  Abdomen:     Soft, non-tender, bowel sounds active all four quadrants,    no masses, no organomegaly  Genitalia:    deferred  Rectal:    deferred  Extremities:   All extremities are intact. No cyanosis or edema  Pulses:   2+ and symmetric all extremities  Skin:   Skin color, texture, turgor normal, no rashes or lesions  Lymph nodes:   Cervical, supraclavicular, and axillary nodes normal  Neurologic:   CNII-XII intact. Normal strength, sensation and reflexes      throughout     Last depression screening scores PHQ 2/9 Scores 10/12/2019 10/05/2018 09/19/2017  PHQ - 2 Score 4 5 5   PHQ- 9 Score 15 21 22    Last fall risk screening Fall Risk  10/05/2018  Falls in the past year? 0  Follow up Falls evaluation completed   Last Audit-C alcohol use screening Alcohol Use Disorder Test (AUDIT) 10/05/2018  1. How often do you have a drink containing alcohol? 1  2. How many  drinks containing alcohol do you have on a typical day when you are drinking? 1  3. How often do you have six or more drinks on one occasion? 1  AUDIT-C Score 3   A score of 3 or more in women, and 4 or more in men indicates increased risk for alcohol abuse, EXCEPT if all of the points are from question 1   No results found for any visits on 10/12/19.  Assessment & Plan    Routine Health Maintenance and Physical Exam  Exercise Activities and Dietary recommendations Goals   None     Immunization History  Administered Date(s) Administered   Influenza,inj,Quad PF,6+ Mos 12/21/2012, 12/02/2014, 04/23/2019, 10/12/2019   Influenza-Unspecified 12/19/2017   Td 06/19/2010   Tdap 10/12/2019    Health Maintenance  Topic Date Due   Hepatitis C Screening  Never done   COVID-19 Vaccine (1) Never done   HIV Screening  Never done   COLONOSCOPY  04/08/2019   TETANUS/TDAP  10/11/2029   INFLUENZA VACCINE  Completed    Discussed health benefits of physical activity, and encouraged him to engage in regular exercise appropriate for his age and condition.  1. Annual physical exam He completed his  Moderna covid vaccine series in July.  - CBC - Comprehensive metabolic panel - Lipid panel - TSH  2. Hypertensive heart disease without heart failure BP well controlled. Continue annual cardiology follow up .  - CBC - Comprehensive metabolic panel  3. Pure hypercholesterolemia He is tolerating atorvastatin well with no adverse effects.   - Lipid panel -  TSH  4. OSA (obstructive sleep apnea) Is using CPAP consistently and medically benefiting from its use.   5. Depression, unspecified depression type He states he is more depressed the last few weeks due to anniversary of his nephews death and more stress from work. His work is very sedentary which is frustrating to him. However he states that he feels sertraline is helping and does not want to make any changes.   6. Morbid  obesity due to excess calories ([place]) Encourage healthy diet and try to work in more physical activity as tolerated.   7. Personal history of colonic polyps Last colonoscopy was 2016 with Dr. Allen Norris and is due for follow up.  - Ambulatory referral to gastroenterology for colonoscopy  8. DDD (degenerative disc disease), cervical   9. DDD (degenerative disc disease), lumbar  He has been having more neck and back pain with history of herniated disk surgery in 2015. Advised he could take OTC tylenol and let me know if he needs referral back to surgery or PT.   10. Need for hepatitis C screening test  - Hepatitis C antibody  11. Need for tetanus, diphtheria, and acellular pertussis (Tdap) vaccine in patient of adolescent age or older  - Tdap vaccine greater than or equal to 7yo IM  12. Need for influenza vaccination  - Flu Vaccine QUAD 36+ mos IM (Fluarix/Fluzone)   13. Left ear pain He states it started bothering him a few weeks ago after having some sinus congestion. Exam is normal today. He states it has been getting better and will let me know if it flares back up.           Lelon Huh, MD  [facility] [phone] (phone) [phone] (fax)  [employer]

## 2019-10-13 ENCOUNTER — Telehealth: Payer: Self-pay

## 2019-10-13 LAB — CBC
Hematocrit: 42.4 % (ref 37.5–51.0)
Hemoglobin: 14.4 g/dL (ref 13.0–17.7)
MCH: 30.5 pg (ref 26.6–33.0)
MCHC: 34 g/dL (ref 31.5–35.7)
MCV: 90 fL (ref 79–97)
Platelets: 296 10*3/uL (ref 150–450)
RBC: 4.72 x10E6/uL (ref 4.14–5.80)
RDW: 12.7 % (ref 11.6–15.4)
WBC: 7.4 10*3/uL (ref 3.4–10.8)

## 2019-10-13 LAB — COMPREHENSIVE METABOLIC PANEL
ALT: 38 IU/L (ref 0–44)
AST: 27 IU/L (ref 0–40)
Albumin/Globulin Ratio: 1.7 (ref 1.2–2.2)
Albumin: 4.3 g/dL (ref 4.0–5.0)
Alkaline Phosphatase: 83 IU/L (ref 48–121)
BUN/Creatinine Ratio: 12 (ref 9–20)
BUN: 12 mg/dL (ref 6–24)
Bilirubin Total: 0.3 mg/dL (ref 0.0–1.2)
CO2: 22 mmol/L (ref 20–29)
Calcium: 9.3 mg/dL (ref 8.7–10.2)
Chloride: 104 mmol/L (ref 96–106)
Creatinine, Ser: 1.01 mg/dL (ref 0.76–1.27)
GFR calc Af Amer: 100 mL/min/{1.73_m2} (ref >59)
GFR calc non Af Amer: 87 mL/min/{1.73_m2} (ref >59)
Globulin, Total: 2.6 g/dL (ref 1.5–4.5)
Glucose: 100 mg/dL — ABNORMAL HIGH (ref 65–99)
Potassium: 4.6 mmol/L (ref 3.5–5.2)
Sodium: 140 mmol/L (ref 134–144)
Total Protein: 6.9 g/dL (ref 6.0–8.5)

## 2019-10-13 LAB — LIPID PANEL
Chol/HDL Ratio: 3.8 ratio (ref 0.0–5.0)
Cholesterol, Total: 114 mg/dL (ref 100–199)
HDL: 30 mg/dL — ABNORMAL LOW (ref >39)
LDL Chol Calc (NIH): 65 mg/dL (ref 0–99)
Triglycerides: 103 mg/dL (ref 0–149)
VLDL Cholesterol Cal: 19 mg/dL (ref 5–40)

## 2019-10-13 LAB — HEPATITIS C ANTIBODY: Hep C Virus Ab: <0.1 s/co ratio (ref 0.0–0.9)

## 2019-10-13 LAB — TSH: TSH: 2.62 u[IU]/mL (ref 0.450–4.500)

## 2019-10-13 NOTE — Telephone Encounter (Signed)
-----   Message from Birdie Sons, MD sent at 10/13/2019  7:48 AM EDT ----- Labs are very good. Cholesterol is 114. Normal blood cell counts, kidney functions, liver functions, electrolytes and thyroid functions. Continue current medications.

## 2019-10-13 NOTE — Telephone Encounter (Signed)
Advised patient of results.  

## 2019-10-13 NOTE — Telephone Encounter (Signed)
Left message for patient to call back okay for [facility] triage to advise of lab results. KW

## 2019-10-15 ENCOUNTER — Telehealth: Payer: Self-pay | Admitting: Family Medicine

## 2019-10-15 ENCOUNTER — Other Ambulatory Visit: Payer: Self-pay

## 2019-10-15 ENCOUNTER — Telehealth (INDEPENDENT_AMBULATORY_CARE_PROVIDER_SITE_OTHER): Payer: Self-pay | Admitting: Gastroenterology

## 2019-10-15 DIAGNOSIS — Z86010 Personal history of colonic polyps: Secondary | ICD-10-CM

## 2019-10-15 MED ORDER — PEG 3350-KCL-NA BICARB-NACL 420 G PO SOLR: 4000.0000 mL | Freq: Once | ORAL | 0 refills | Status: AC

## 2019-10-15 NOTE — Telephone Encounter (Signed)
Copied from [place] [id number]. Topic: General - Other >> Oct 15, 2019  9:05 AM Keene Breath wrote: Reason for CRM: Patient's wife called to ask what type of referral was sent regarding a colonoscopy.  She stated that her husbands insurance will cover 100% for a routine colonoscopy, but not for a diagnostic.  She said she wanted to be prepared for the cost.  Please call to discuss at [phone]

## 2019-10-15 NOTE — Progress Notes (Signed)
Gastroenterology Pre-Procedure Review  Request Date: Monday 11/01/19 Requesting Physician: Dr. Allen Norris  PATIENT REVIEW QUESTIONS: The patient responded to the following health history questions as indicated:    1. Are you having any GI issues? no 2. Do you have a personal history of Polyps? yes (04/07/14 colonoscopy performed by Dr. Allen Norris) 3. Do you have a family history of Colon Cancer or Polyps? no 4. Diabetes Mellitus? no 5. Joint replacements in the past 12 months?no 6. Major health problems in the past 3 months?no 7. Any artificial heart valves, MVP, or defibrillator?no    MEDICATIONS & ALLERGIES:    Patient reports the following regarding taking any anticoagulation/antiplatelet therapy:   Plavix, Coumadin, Eliquis, Xarelto, Lovenox, Pradaxa, Brilinta, or Effient? yes (Plavix 75 mg blood thinner request sent to Dr. Shelva Majestic) Aspirin? yes (81 mg daily)  Patient confirms/reports the following medications:  Current Outpatient Medications  Medication Sig Dispense Refill  . aspirin EC 81 MG EC tablet Take 1 tablet (81 mg total) by mouth daily. (Patient taking differently: Take 81 mg by mouth at bedtime. )    . atorvastatin (LIPITOR) 80 MG tablet Take 1 tablet (80 mg total) by mouth daily. 90 tablet 1  . clopidogrel (PLAVIX) 75 MG tablet TAKE 1 TABLET BY MOUTH EVERY DAY 90 tablet 3  . metoprolol succinate (TOPROL XL) 50 MG 24 hr tablet Take 1 tablet (50 mg total) by mouth daily. Take with or immediately following a meal. 90 tablet 3  . nitroGLYCERIN (NITROSTAT) 0.4 MG SL tablet Place 1 tablet (0.4 mg total) under the tongue every 5 (five) minutes as needed for chest pain. 25 tablet 12  . sertraline (ZOLOFT) 100 MG tablet TAKE 1 TABLET BY MOUTH EVERY DAY 90 tablet 4  . valsartan (DIOVAN) 160 MG tablet Take 1.5 tablets (240 mg total) by mouth daily. 135 tablet 3   No current facility-administered medications for this visit.    Patient confirms/reports the following allergies:  No  Known Allergies  No orders of the defined types were placed in this encounter.   AUTHORIZATION INFORMATION Primary Insurance: 1D#: Group #:  Secondary Insurance: 1D#: Group #:  SCHEDULE INFORMATION: Date: Monday 11/01/19 Time: Location:[facility]

## 2019-10-18 ENCOUNTER — Telehealth: Payer: Self-pay

## 2019-10-18 NOTE — Telephone Encounter (Signed)
° °  [facility] Pre-operative Risk Assessment    HEARTCARE STAFF: - Please ensure there is not already an duplicate clearance open for this procedure. - Under Visit Info/Reason for Call, type in Other and utilize the format Clearance MM/DD/YY or Clearance TBD. Do not use dashes or single digits. - If request is for dental extraction, please clarify the # of teeth to be extracted.  Request for surgical clearance:  1. What type of surgery is being performed? Colonoscopy   2. When is this surgery scheduled? 11/01/19  3. What type of clearance is required (medical clearance vs. Pharmacy clearance to hold med vs. Both)? both  4. Are there any medications that need to be held prior to surgery and how long? plavix  5. Practice name and name of physician performing surgery? [facility]- Eduardo Lame MD  6. What is the office phone number? [phone]   7.   What is the office fax number? [phone]  8.   Anesthesia type (None, local, MAC, general) ? choice   Eduardo Poole Khaniya Poole 10/18/2019, 4:24 PM  _________________________________________________________________   (provider comments below)

## 2019-10-19 NOTE — Telephone Encounter (Addendum)
   Primary Cardiologist: Shelva Majestic, MD  Chart reviewed as part of pre-operative protocol coverage. Patient was contacted 10/19/2019 in reference to pre-operative risk assessment for pending surgery as outlined below.  Eduardo Poole was last seen on 07/16/19 by Dr. Claiborne Billings. He has history of CAD with prior STEMI/PCI (PTCA) in 2016, transient TIA symptomatology at that time, PVCs, remote DVT with recurrence 2019, severe OSA on CPAP in context of obesity, DJD, HLD. I reached out to patient but got VM. LMTCB. He returned my call and affirms he is doing well without any new cardiac complaints. Therefore, based on ACC/AHA guidelines, Eduardo Poole would be at acceptable risk for the planned procedure without further cardiovascular testing.   Patient has been maintained on long term ASA + Plavix. Surgical request is asking to hold Plavix for colonoscopy so will route to Dr. Claiborne Billings for input. Dr. Claiborne Billings - Please route response to [facility] PREOP (the pre-op pool). Thank you.   Charlie Pitter, PA-C 10/19/2019, 8:24 AM

## 2019-10-20 NOTE — Telephone Encounter (Signed)
I explained to pt's wife Malachy Mood that I do not think colonoscopy will be coded as routine because he has a personal history of polyps but she really needs to talk with [facility] concerning coding

## 2019-10-20 NOTE — Telephone Encounter (Signed)
Patient is cleared to undergo colonoscopy.  Hold aspirin/Plavix for minimum of 5 days prior to the procedure

## 2019-10-21 ENCOUNTER — Encounter: Payer: Self-pay | Admitting: Gastroenterology

## 2019-10-21 ENCOUNTER — Telehealth: Payer: Self-pay

## 2019-10-21 NOTE — Telephone Encounter (Signed)
Patient returned call. Aware of recommendations regarding holding ASA/Plavix and he was notified that Dr. Allen Norris has been made aware of clearance info also

## 2019-10-21 NOTE — Telephone Encounter (Signed)
Blood Thinner advice received from Dr. Marcello Moores.  LVM advising patient to stop Plavix 5 days prior to colonoscopy scheduled on 11/01/19.  Asked him to call me back if he has any questions.  Thanks,   [place], [place]

## 2019-10-21 NOTE — Telephone Encounter (Signed)
See below

## 2019-10-21 NOTE — Telephone Encounter (Signed)
Left message for patient to call back about meds to be held prior to procedure Encounter routed to MD via Epic concerning clearance

## 2019-10-21 NOTE — Telephone Encounter (Signed)
Callback pool to inform the patient to hold plavix for 5 days prior to colonoscopy. I attempted to contact the patient, however was unable to reach him.

## 2019-10-21 NOTE — Telephone Encounter (Signed)
Also routed encounter notes via epic fax function to [phone]

## 2019-10-22 ENCOUNTER — Encounter: Payer: Self-pay | Admitting: Anesthesiology

## 2019-10-26 ENCOUNTER — Telehealth: Payer: Self-pay

## 2019-10-26 NOTE — Telephone Encounter (Signed)
Sarah,   I don't really know how this works. It sounds like this was approved as a diagnostic colonoscopy, but I would consider it to be a screening since he is not having any symptoms, although he did have a polyp in 2016.  Isn't this taken care of by the GI department? I don't know what to tell him.

## 2019-10-26 NOTE — Telephone Encounter (Signed)
Copied from [place] [id number]. Topic: General - Other >> Oct 26, 2019 10:54 AM Alanda Slim E wrote: Reason for CRM: Pt received a procedure code from the place he was scheduled a colonoscopy with (Dr. Allen Norris)  /Pt states when he checked with his insurance it was diagnostic and they would not cover it/ Pt wondered if this should be coded as routine / Pt does not want to have this done if he has to pay for it/ Pt asked if there was something the office can do/ please advise

## 2019-10-28 ENCOUNTER — Telehealth: Payer: Self-pay

## 2019-10-28 ENCOUNTER — Other Ambulatory Visit: Admission: RE | Admit: 2019-10-28 | Payer: BC Managed Care – PPO | Source: Ambulatory Visit

## 2019-10-28 NOTE — Telephone Encounter (Signed)
Patient called and left a voicemail to inform us that she needed procedure canceled for 11/01/2019. Called ENDO in [place] and cancel the procedure. Patient states he has to pay for procedure out of pocket and he can not afford this.  Informed patient that this has been canceled

## 2019-11-01 ENCOUNTER — Ambulatory Visit
Admission: RE | Admit: 2019-11-01 | Payer: BC Managed Care – PPO | Source: Home / Self Care | Admitting: Gastroenterology

## 2019-11-01 SURGERY — COLONOSCOPY WITH PROPOFOL
Anesthesia: Choice

## 2019-11-10 NOTE — Telephone Encounter (Signed)
Once polyps are found colonoscopy will be diagnostic. [facility] will be doing the coding for colonoscopy

## 2019-11-16 NOTE — Telephone Encounter (Signed)
Please review. Thanks!  

## 2019-12-21 ENCOUNTER — Encounter: Payer: BC Managed Care – PPO | Admitting: Family Medicine

## 2020-02-07 ENCOUNTER — Telehealth: Payer: Self-pay

## 2020-02-07 ENCOUNTER — Other Ambulatory Visit: Payer: Self-pay

## 2020-02-07 ENCOUNTER — Telehealth (INDEPENDENT_AMBULATORY_CARE_PROVIDER_SITE_OTHER): Payer: BC Managed Care – PPO | Admitting: Adult Health

## 2020-02-07 VITALS — BP 130/92

## 2020-02-07 DIAGNOSIS — H9209 Otalgia, unspecified ear: Secondary | ICD-10-CM | POA: Diagnosis not present

## 2020-02-07 DIAGNOSIS — R062 Wheezing: Secondary | ICD-10-CM | POA: Diagnosis not present

## 2020-02-07 DIAGNOSIS — J069 Acute upper respiratory infection, unspecified: Secondary | ICD-10-CM | POA: Diagnosis not present

## 2020-02-07 MED ORDER — PREDNISONE 10 MG (21) PO TBPK: ORAL_TABLET | ORAL | 0 refills | Status: DC

## 2020-02-07 MED ORDER — AMOXICILLIN-POT CLAVULANATE 875-125 MG PO TABS: 1.0000 | ORAL_TABLET | Freq: Two times a day (BID) | ORAL | 0 refills | Status: DC

## 2020-02-07 NOTE — Telephone Encounter (Signed)
Please review and advise. Dr. Caryn Section is out of the office until 02/14/2020.

## 2020-02-07 NOTE — Telephone Encounter (Signed)
appt made

## 2020-02-07 NOTE — Telephone Encounter (Signed)
Patient's wife Eduardo Poole, on [place], called and says he tested positive for COVID and had a virtual visit today with Laverna Peace, FNP. She says he was prescribed Augmentin and Prednisone. She asks what was he given those for and should he have been given something specifically for COVID. I advised there is no medication specifically for COVID. The medications given were for his symptoms of sinus pressure and wheezing. The Augmentin will cover any bacterial infection, such as sinus or pneumonia, and the prednisone will help with inflammation in the lungs and sinuses. She verbalized understanding and asks what else should they be looking out for. I advised 14 day quarantine since exposure, which they tested positive on 02/04/20, to end on 02/18/20. Advised CDC guidelines for coming off quarantine, handwashing, masks, social distancing. Go to ED for CP, SOB, worsening symptoms. Given COVID infusion number to call for information and to see if he qualifies with his underlying health conditions the wife mentioned.

## 2020-02-07 NOTE — Progress Notes (Signed)
Virtual telephone visit    Virtual Visit via Telephone Note   This visit type was conducted due to national recommendations for restrictions regarding the COVID-19 Pandemic (e.g. social distancing) in an effort to limit this patient's exposure and mitigate transmission in our community. Due to his co-morbid illnesses, this patient is at least at moderate risk for complications without adequate follow up. This format is felt to be most appropriate for this patient at this time. The patient did not have access to video technology or had technical difficulties with video requiring transitioning to audio format only (telephone). Physical exam was limited to content and character of the telephone converstion.     Parties involved in visit as below:    Patient location: at home  Provider location: Provider: Provider's office at  [facility], [place].     I discussed the limitations of evaluation and management by telemedicine and the availability of in person appointments. The patient expressed understanding and agreed to proceed.   Visit Date: 02/07/2020  Today's healthcare provider: Marcille Buffy, FNP   Chief Complaint  Patient presents with  . Covid Positive  . URI  . Ear Pain   Subjective    Pt tested pos for covid today using an at home covid test. Pt's wife tested pos for covid a few days earlier using at home testing kit.    Onset 01/28/2020 and he has cough. He has clear productive cough.  He is very congested with dry cough.  Ear pain in left ear.  No drainage from ear.   Chest tightness. Body aches.    Patient  denies any fever, body aches,chills, rash, chest pain, shortness of breath, nausea, vomiting, or diarrhea.    Patient Active Problem List   Diagnosis Date Noted  . Upper respiratory tract infection 02/13/2020  . Otalgia 02/13/2020  . Wheezing 02/13/2020  . Morbid obesity due to excess calories ([place]) 10/05/2018  . Left knee  pain 04/09/2017  . Myalgia and myositis 04/09/2017  . BMI 37.0-37.9, adult 09/17/2016  . OSA (obstructive sleep apnea) 01/18/2016  . Hyperlipidemia   . CAD (coronary artery disease)   . Hypertensive heart disease without heart failure   . DDD (degenerative disc disease), cervical 09/19/2015  . DDD (degenerative disc disease), lumbar 09/19/2015  . Hyperlipidemia LDL goal <70 07/15/2015  . Frequent PVCs 12/02/2014  . Abnormal stress test   . History of CVA in adulthood 10/20/2014  . Temporary cerebral vascular dysfunction 10/04/2014  . Tremor 10/04/2014  . History of TIA (transient ischemic attack) 08/04/2014  . Diastolic dysfunction 41/93/7902  . Anxiety 08/02/2014  . Depression 08/02/2014  . Diplopia 08/02/2014  . Fatigue 08/02/2014  . Headache 08/02/2014  . H/O adenomatous polyp of colon 08/02/2014  . History of DVT (deep vein thrombosis) 08/02/2014  . Hypertension 08/02/2014  . Insomnia 08/02/2014  . Lymphadenopathy 08/02/2014  . Sleep disorder 08/02/2014  . Chest wall pain 08/02/2014  . Pleurisy 08/02/2014  . Personal history of other venous thrombosis and embolism 08/02/2014  . ST elevation myocardial infarction (STEMI) involving right coronary artery with complication ([place]) 40/97/3532  . Combined fat and carbohydrate induced hyperlipemia 03/24/2014  . Breathlessness on exertion 03/24/2014  . History of knee surgery 07/18/2011  . Idiopathic localized osteoarthropathy 06/14/2011  . Pain in joint involving lower leg 06/14/2011   Past Medical History:  Diagnosis Date  . CAD (coronary artery disease)    a. 07/2014 Inf STEMI/PTCA:  LM nl, LAD nl,  LCX nl, RCA ulcerated plaque (PTCA only);  b. 10/2014 MV: intermediate risk, ? inflat isch/scar;  c. 10/2014 Cath: Nl cors w/o RCA restenosis.  . Diastolic dysfunction    a. 07/2014 Echo: EF 60-65%, Gr1 DD.  . DVT of lower extremity (deep venous thrombosis) ([place])    a. ~ 2009, per pt was on lovenox/coumadin  . GERD (gastroesophageal  reflux disease)   . Hyperlipidemia   . Hypertension   . Hypertensive heart disease   . TIA (transient ischemic attack) 07/23/2014   Past Surgical History:  Procedure Laterality Date  . ARTHROPLASTY Right 07/09/2010  . CARDIAC CATHETERIZATION N/A 07/23/2014   Procedure: Left Heart Cath;  Surgeon: Troy Sine, MD;  Location: [facility];  Service: Cardiovascular;  Laterality: N/A;  . CARDIAC CATHETERIZATION N/A 07/23/2014   Procedure: Coronary Balloon Angioplasty;  Surgeon: Troy Sine, MD;  Location: [facility];  Service: Cardiovascular;  Laterality: N/A;  . CARDIAC CATHETERIZATION N/A 11/10/2014   Procedure: Left Heart Cath and Coronary Angiography;  Surgeon: Troy Sine, MD;  Location: [facility];  Service: Cardiovascular;  Laterality: N/A;  . CERVICAL [place] SURGERY  2015  . JOINT REPLACEMENT Right    Right knee  . MENISCUS REPAIR Right 11/03/1995   Meniscus Lateral Tear: repaired by Dr. Sabra Heck at [facility]  . NASAL SINUS SURGERY     Deviated Septum  . REPLACEMENT TOTAL KNEE Right 2011   [facility]   Social History   Tobacco Use  . Smoking status: Never Smoker  . Smokeless tobacco: Former Systems developer    Types: Secondary school teacher  . Vaping Use: Never used  Substance Use Topics  . Alcohol use: No    Alcohol/week: 1.0 standard drink    Types: 1 Standard drinks or equivalent per week    Comment: social drinker  . Drug use: No   Social History   Socioeconomic History  . Marital status: Married    Spouse name: Not on file  . Number of children: 2  . Years of education: 22  . Highest education level: Not on file  Occupational History  . Occupation: Employed    Comment: Works at [employer]  . Smoking status: Never Smoker  . Smokeless tobacco: Former Systems developer    Types: Secondary school teacher  . Vaping Use: Never used  Substance and Sexual Activity  . Alcohol use: No    Alcohol/week: 1.0 standard drink    Types: 1  Standard drinks or equivalent per week    Comment: social drinker  . Drug use: No  . Sexual activity: Yes  Other Topics Concern  . Not on file  Social History Narrative  . Not on file   Social Determinants of Health   Financial Resource Strain: Not on file  Food Insecurity: Not on file  Transportation Needs: Not on file  Physical Activity: Not on file  Stress: Not on file  Social Connections: Not on file  Intimate Partner Violence: Not on file   Family Status  Relation Name Status  . Mother  Alive  . Father  Alive  . Sister  Alive  . PGF  (Not Specified)  . MGF  Deceased   Family History  Problem Relation Age of Onset  . Hypertension Mother   . Arthritis Mother   . Diverticulitis Mother   . Gout Father   . Autoimmune disease Father   . Arthritis Sister   .  Diabetes Paternal Grandfather   . Diabetes Maternal Grandfather   . Cancer Maternal Grandfather        unknown   No Known Allergies    Medications: Outpatient Medications Prior to Visit  Medication Sig  . aspirin EC 81 MG EC tablet Take 1 tablet (81 mg total) by mouth daily. (Patient taking differently: Take 81 mg by mouth at bedtime.)  . atorvastatin (LIPITOR) 80 MG tablet Take 1 tablet (80 mg total) by mouth daily.  . clopidogrel (PLAVIX) 75 MG tablet TAKE 1 TABLET BY MOUTH EVERY DAY  . metoprolol succinate (TOPROL XL) 50 MG 24 hr tablet Take 1 tablet (50 mg total) by mouth daily. Take with or immediately following a meal.  . nitroGLYCERIN (NITROSTAT) 0.4 MG SL tablet Place 1 tablet (0.4 mg total) under the tongue every 5 (five) minutes as needed for chest pain.  Marland Kitchen sertraline (ZOLOFT) 100 MG tablet TAKE 1 TABLET BY MOUTH EVERY DAY  . valsartan (DIOVAN) 160 MG tablet Take 1.5 tablets (240 mg total) by mouth daily.   No facility-administered medications prior to visit.    Review of Systems  Constitutional: Positive for fatigue. Negative for activity change, appetite change, chills, diaphoresis, fever and  unexpected weight change.  HENT: Positive for congestion, sinus pressure, sinus pain, sneezing and tinnitus. Negative for postnasal drip, rhinorrhea and sore throat.   Respiratory: Positive for cough. Negative for apnea, choking, chest tightness, shortness of breath, wheezing and stridor.        Objective    BP (!) 130/92       Patient is alert and oriented and responsive to questions Engages in conversation with provider. Speaks in full sentences without any pauses without any shortness of breath or distress.     Assessment & Plan     Wheezing  Otalgia, unspecified laterality  Upper respiratory tract infection, unspecified type    Meds ordered this encounter  Medications  . predniSONE (STERAPRED UNI-PAK 21 TAB) 10 MG (21) TBPK tablet    Sig: PO: Take 6 tablets on day 1:Take 5 tablets day 2:Take 4 tablets day 3: Take 3 tablets day 4:Take 2 tablets day five: 5 Take 1 tablet day 6    Dispense:  21 tablet    Refill:  0  . amoxicillin-clavulanate (AUGMENTIN) 875-125 MG tablet    Sig: Take 1 tablet by mouth 2 (two) times daily.    Dispense:  20 tablet    Refill:  0  testing for covid, flu, RSV advised in parking lot per protocol.  No follow-ups on file.    I discussed the assessment and treatment plan with the patient. The patient was provided an opportunity to ask questions and all were answered. The patient agreed with the plan and demonstrated an understanding of the instructions.   The patient was advised to call back or seek an in-person evaluation if the symptoms worsen or if the condition fails to improve as anticipated.   Red Flags discussed. The patient was given clear instructions to go to ER or return to medical center if any red flags develop, symptoms do not improve, worsen or new problems develop. They verbalized understanding.  I provided 30 minutes of non-face-to-face time during this encounter..refd  Marcille Buffy, [place] [phone] (phone) [phone] (fax)  [place]

## 2020-02-07 NOTE — Telephone Encounter (Signed)
Copied from [place] [id number]. Topic: General - Other >> Feb 07, 2020  1:19 PM Rainey Pines A wrote: Patients wife stated that patient took an at home covid test and tested positive. Patients wife would like a callback as soon as possible from Dr .Sabino Snipes nurse in regards to what he would advise patient do next. Please advise

## 2020-02-13 ENCOUNTER — Encounter: Payer: Self-pay | Admitting: Adult Health

## 2020-02-13 DIAGNOSIS — H9209 Otalgia, unspecified ear: Secondary | ICD-10-CM | POA: Insufficient documentation

## 2020-02-13 DIAGNOSIS — J069 Acute upper respiratory infection, unspecified: Secondary | ICD-10-CM | POA: Insufficient documentation

## 2020-02-13 DIAGNOSIS — R062 Wheezing: Secondary | ICD-10-CM | POA: Insufficient documentation

## 2020-02-13 NOTE — Patient Instructions (Signed)
Upper Respiratory Infection, Adult An upper respiratory infection (URI) affects the nose, throat, and upper air passages. URIs are caused by germs (viruses). The most common type of URI is often called "the common cold." Medicines cannot cure URIs, but you can do things at home to relieve your symptoms. URIs usually get better within 7-10 days. Follow these instructions at home: Activity  Rest as needed.  If you have a fever, stay home from work or school until your fever is gone, or until your doctor says you may return to work or school. ? You should stay home until you cannot spread the infection anymore (you are not contagious). ? Your doctor may have you wear a face mask so you have less risk of spreading the infection. Relieving symptoms  Gargle with a salt-water mixture 3-4 times a day or as needed. To make a salt-water mixture, completely dissolve -1 tsp of salt in 1 cup of warm water.  Use a cool-mist humidifier to add moisture to the air. This can help you breathe more easily. Eating and drinking   Drink enough fluid to keep your pee (urine) pale yellow.  Eat soups and other clear broths. General instructions   Take over-the-counter and prescription medicines only as told by your doctor. These include cold medicines, fever reducers, and cough suppressants.  Do not use any products that contain nicotine or tobacco. These include cigarettes and e-cigarettes. If you need help quitting, ask your doctor.  Avoid being where people are smoking (avoid secondhand smoke).  Make sure you get regular shots and get the flu shot every year.  Keep all follow-up visits as told by your doctor. This is important. How to avoid spreading infection to others   Wash your hands often with soap and water. If you do not have soap and water, use hand sanitizer.  Avoid touching your mouth, face, eyes, or nose.  Cough or sneeze into a tissue or your sleeve or elbow. Do not cough or sneeze  into your hand or into the air. Contact a doctor if:  You are getting worse, not better.  You have any of these: ? A fever. ? Chills. ? Brown or red mucus in your nose. ? Yellow or brown fluid (discharge)coming from your nose. ? Pain in your face, especially when you bend forward. ? Swollen neck glands. ? Pain with swallowing. ? White areas in the back of your throat. Get help right away if:  You have shortness of breath that gets worse.  You have very bad or constant: ? Headache. ? Ear pain. ? Pain in your forehead, behind your eyes, and over your cheekbones (sinus pain). ? Chest pain.  You have long-lasting (chronic) lung disease along with any of these: ? Wheezing. ? Long-lasting cough. ? Coughing up blood. ? A change in your usual mucus.  You have a stiff neck.  You have changes in your: ? Vision. ? Hearing. ? Thinking. ? Mood. Summary  An upper respiratory infection (URI) is caused by a germ called a virus. The most common type of URI is often called "the common cold."  URIs usually get better within 7-10 days.  Take over-the-counter and prescription medicines only as told by your doctor. This information is not intended to replace advice given to you by your health care provider. Make sure you discuss any questions you have with your health care provider. Document Revised: 02/12/2018 Document Reviewed: 09/27/2016 Elsevier Patient Education  [place], Adult An earache,  or ear pain, can be caused by many things, including:  An infection.  Ear wax buildup.  Ear pressure.  Something in the ear that should not be there (foreign body).  A sore throat.  Tooth problems.  Jaw problems. Treatment of the earache will depend on the cause. If the cause is not clear or cannot be determined, you may need to watch your symptoms until your earache goes away or until a cause is found. Follow these instructions at home: Medicines  Take or apply  over-the-counter and prescription medicines only as told by your health care provider.  If you were prescribed an antibiotic medicine, use it as told by your health care provider. Do not stop using the antibiotic even if you start to feel better.  Do not put anything in your ear other than medicine that is prescribed by your health care provider. Managing pain If directed, apply heat to the affected area as often as told by your health care provider. Use the heat source that your health care provider recommends, such as a moist heat pack or a heating pad.  Place a towel between your skin and the heat source.  Leave the heat on for 20-30 minutes.  Remove the heat if your skin turns bright red. This is especially important if you are unable to feel pain, heat, or cold. You may have a greater risk of getting burned. If directed, put ice on the affected area as often as told by your health care provider. To do this:      Put ice in a plastic bag.  Place a towel between your skin and the bag.  Leave the ice on for 20 minutes, 2-3 times a day. General instructions  Pay attention to any changes in your symptoms.  Try resting in an upright position instead of lying down. This may help to reduce pressure in your ear and relieve pain.  Chew gum if it helps to relieve your ear pain.  Treat any allergies as told by your health care provider.  Drink enough fluid to keep your urine pale yellow.  It is up to you to get the results of any tests that were done. Ask your health care provider, or the department that is doing the tests, when your results will be ready.  Keep all follow-up visits as told by your health care provider. This is important. Contact a health care provider if:  Your pain does not improve within 2 days.  Your earache gets worse.  You have new symptoms.  You have a fever. Get help right away if you:  Have a severe headache.  Have a stiff neck.  Have trouble  swallowing.  Have redness or swelling behind your ear.  Have fluid or blood coming from your ear.  Have hearing loss.  Feel dizzy. Summary  An earache, or ear pain, can be caused by many things.  Treatment of the earache will depend on the cause. Follow recommendations from your health care provider to treat your ear pain.  If the cause is not clear or cannot be determined, you may need to watch your symptoms until your earache goes away or until a cause is found.  Keep all follow-up visits as told by your health care provider. This is important. This information is not intended to replace advice given to you by your health care provider. Make sure you discuss any questions you have with your health care provider. Document Revised: 09/12/2018 Document Reviewed: 09/12/2018  Elsevier Patient Education  [employer].

## 2020-02-25 ENCOUNTER — Telehealth: Payer: Self-pay | Admitting: Cardiovascular Disease

## 2020-02-25 ENCOUNTER — Telehealth (INDEPENDENT_AMBULATORY_CARE_PROVIDER_SITE_OTHER): Payer: BC Managed Care – PPO | Admitting: Family Medicine

## 2020-02-25 DIAGNOSIS — Z8616 Personal history of COVID-19: Secondary | ICD-10-CM

## 2020-02-25 DIAGNOSIS — R059 Cough, unspecified: Secondary | ICD-10-CM

## 2020-02-25 MED ORDER — ATORVASTATIN CALCIUM 80 MG PO TABS: 80.0000 mg | ORAL_TABLET | Freq: Every day | ORAL | 0 refills | Status: DC

## 2020-02-25 MED ORDER — AZITHROMYCIN 250 MG PO TABS: ORAL_TABLET | ORAL | 0 refills | Status: AC

## 2020-02-25 MED ORDER — HYDROCOD POLST-CPM POLST ER 10-8 MG/5ML PO SUER: 5.0000 mL | Freq: Two times a day (BID) | ORAL | 0 refills | Status: AC | PRN

## 2020-02-25 MED ORDER — PREDNISONE 10 MG PO TABS: ORAL_TABLET | ORAL | 0 refills | Status: AC

## 2020-02-25 NOTE — Telephone Encounter (Signed)
Spoke with pt's wife, okay per DPR. Advised pt needed to make an office appointment. Scheduled pt for visit with Dr. Claiborne Billings on 05/19/20 at 3 p.m. 90-day refill sent to pharmacy per pt request.

## 2020-02-25 NOTE — Progress Notes (Signed)
MyChart Video Visit    Virtual Visit via Video Note   This visit type was conducted due to national recommendations for restrictions regarding the COVID-19 Pandemic (e.g. social distancing) in an effort to limit this patient's exposure and mitigate transmission in our community. This patient is at least at moderate risk for complications without adequate follow up. This format is felt to be most appropriate for this patient at this time. Physical exam was limited by quality of the video and audio technology used for the visit.   Patient location: home Provider location: [facility]  I discussed the limitations of evaluation and management by telemedicine and the availability of in person appointments. The patient expressed understanding and agreed to proceed.  Patient: Eduardo Poole   DOB: August 24, 1970   50 y.o. Male  MRN: [id number] Visit Date: 02/25/2020  Today's healthcare provider: Lelon Huh, MD   No chief complaint on file.  Subjective    HPI  Pt tested positive for covid on 02/04/2020 and was prescribed augmentin and prednisone for secondary infections and inflammation. Pt said he finished quarantine on 02/18/2020. Pt feels that the cough from the covid infection never went away and is gradually worsening. The cough is productive and the pt is able to cough up a yellowy sputum. Pt says he has been hydrating and reports that OTC cough medications have given him no relief.  Is a little short of breath with exertion. Seems to have worsened since finishing prednisone. No fevers. Mild headache.     Medications: Outpatient Medications Prior to Visit  Medication Sig  . amoxicillin-clavulanate (AUGMENTIN) 875-125 MG tablet Take 1 tablet by mouth 2 (two) times daily.  Marland Kitchen aspirin EC 81 MG EC tablet Take 1 tablet (81 mg total) by mouth daily. (Patient taking differently: Take 81 mg by mouth at bedtime.)  . atorvastatin (LIPITOR) 80 MG tablet Take 1 tablet (80 mg total) by mouth daily.  .  clopidogrel (PLAVIX) 75 MG tablet TAKE 1 TABLET BY MOUTH EVERY DAY  . metoprolol succinate (TOPROL XL) 50 MG 24 hr tablet Take 1 tablet (50 mg total) by mouth daily. Take with or immediately following a meal.  . nitroGLYCERIN (NITROSTAT) 0.4 MG SL tablet Place 1 tablet (0.4 mg total) under the tongue every 5 (five) minutes as needed for chest pain.  . predniSONE (STERAPRED UNI-PAK 21 TAB) 10 MG (21) TBPK tablet PO: Take 6 tablets on day 1:Take 5 tablets day 2:Take 4 tablets day 3: Take 3 tablets day 4:Take 2 tablets day five: 5 Take 1 tablet day 6  . sertraline (ZOLOFT) 100 MG tablet TAKE 1 TABLET BY MOUTH EVERY DAY  . valsartan (DIOVAN) 160 MG tablet Take 1.5 tablets (240 mg total) by mouth daily.   No facility-administered medications prior to visit.    Review of Systems  Respiratory: Positive for cough and shortness of breath. Negative for apnea, choking, chest tightness, wheezing and stridor.        Pt says he has had a little chest pain that he believes is from the cough.      Objective    There were no vitals taken for this visit.   Physical Exam   Awake, alert, oriented x 3. In no apparent distress   Assessment & Plan     1. History of COVID-19 Mostly resolved, but cough and dyspnea worsened since finishing prednisone as below.   2. Cough  - predniSONE (DELTASONE) 10 MG tablet; 6 tablets for 1 day, then  5 for 1 day, then 4 for 1 day, then 3 for 1 day, then 2 for 1 day then 1 daily until gone  Dispense: 40 tablet; Refill: 0 - azithromycin (ZITHROMAX) 250 MG tablet; 2 by mouth today, then 1 daily for 4 days  Dispense: 6 tablet; Refill: 0 - chlorpheniramine-HYDROcodone (TUSSIONEX PENNKINETIC ER) 10-8 MG/5ML SUER; Take 5 mLs by mouth every 12 (twelve) hours as needed for cough.  Dispense: 115 mL; Refill: 0   Consider long term maintenance inhaler if sx persist after finishing prednisone.  Call if symptoms change or if not rapidly improving.        Video connection was lost  when less than 50% of the duration of the visit was complete, at which time the remainder of the visit was completed via audio only.   I discussed the assessment and treatment plan with the patient. The patient was provided an opportunity to ask questions and all were answered. The patient agreed with the plan and demonstrated an understanding of the instructions.   The patient was advised to call back or seek an in-person evaluation if the symptoms worsen or if the condition fails to improve as anticipated.  I provided 12 minutes of non-face-to-face time during this encounter.  The entirety of the information documented in the History of Present Illness, Review of Systems and Physical Exam were personally obtained by me. Portions of this information were initially documented by the CMA and reviewed by me for thoroughness and accuracy.     Lelon Huh, MD [facility] [phone] (phone) [phone] (fax)  [place]

## 2020-02-25 NOTE — Telephone Encounter (Signed)
*  STAT* If patient is at the pharmacy, call can be transferred to refill team.   1. Which medications need to be refilled? (please list name of each medication and dose if known) atorvastatin (LIPITOR) 80 MG tablet  2. Which pharmacy/location (including street and city if local pharmacy) is medication to be sent to? [id number] - [place], [place] - [address]  3. Do they need a 30 day or 90 day supply? 90 day  Patient has been out of medication for a few weeks

## 2020-02-29 ENCOUNTER — Telehealth: Payer: Self-pay | Admitting: Family Medicine

## 2020-02-29 DIAGNOSIS — Z8616 Personal history of COVID-19: Secondary | ICD-10-CM

## 2020-02-29 DIAGNOSIS — R062 Wheezing: Secondary | ICD-10-CM

## 2020-02-29 DIAGNOSIS — R059 Cough, unspecified: Secondary | ICD-10-CM

## 2020-02-29 NOTE — Telephone Encounter (Signed)
Pts wife called stating that the pt is still experiencing pain under his ribs, and is still having a cough. She states that it has not gotten any better with the medications. She is requesting to have advice from PCP about next steps for pt as he will be finished with antibiotic today. Please advise.    [id number] - [place], [place] - [address]  [address] [address]  Phone: [phone] Fax: [phone]  Hours: Not open 24 hours

## 2020-03-01 NOTE — Telephone Encounter (Signed)
Please review.  Is it okay for him to come into the office.   Thanks,   -Mickel Baas

## 2020-03-01 NOTE — Telephone Encounter (Signed)
Pt wife would like her husband to be seen in office will take virtual  he may have PNA . Pt call wife back today

## 2020-03-01 NOTE — Telephone Encounter (Signed)
Patient's wife advised. Virtual visit scheduled for 03/03/2020 at 2:40pm. Patient plans to have chest x ray done tomorrow.

## 2020-03-01 NOTE — Telephone Encounter (Signed)
You can put him on my schedule at 2:40 on Friday. He needs to get a chest xray today or tomorrow.

## 2020-03-02 ENCOUNTER — Ambulatory Visit
Admission: RE | Admit: 2020-03-02 | Discharge: 2020-03-02 | Disposition: A | Discharge status: Home / Self Care | Payer: BC Managed Care – PPO | Attending: Family Medicine | Admitting: Family Medicine

## 2020-03-02 ENCOUNTER — Ambulatory Visit
Admission: RE | Admit: 2020-03-02 | Discharge: 2020-03-02 | Disposition: A | Discharge status: Home / Self Care | Payer: BC Managed Care – PPO | Source: Ambulatory Visit | Attending: Family Medicine | Admitting: Family Medicine

## 2020-03-02 ENCOUNTER — Other Ambulatory Visit: Payer: Self-pay

## 2020-03-02 DIAGNOSIS — U071 COVID-19: Secondary | ICD-10-CM | POA: Diagnosis not present

## 2020-03-02 DIAGNOSIS — R059 Cough, unspecified: Secondary | ICD-10-CM | POA: Diagnosis not present

## 2020-03-02 DIAGNOSIS — Z8616 Personal history of COVID-19: Secondary | ICD-10-CM | POA: Insufficient documentation

## 2020-03-02 DIAGNOSIS — R062 Wheezing: Secondary | ICD-10-CM

## 2020-03-03 ENCOUNTER — Telehealth (INDEPENDENT_AMBULATORY_CARE_PROVIDER_SITE_OTHER): Payer: BC Managed Care – PPO | Admitting: Family Medicine

## 2020-03-03 ENCOUNTER — Encounter: Payer: Self-pay | Admitting: Family Medicine

## 2020-03-03 DIAGNOSIS — Z8616 Personal history of COVID-19: Secondary | ICD-10-CM

## 2020-03-03 DIAGNOSIS — R053 Chronic cough: Secondary | ICD-10-CM | POA: Diagnosis not present

## 2020-03-03 MED ORDER — FLUTICASONE-SALMETEROL 250-50 MCG/DOSE IN AEPB: 1.0000 | INHALATION_SPRAY | Freq: Two times a day (BID) | RESPIRATORY_TRACT | 0 refills | Status: DC

## 2020-03-03 NOTE — Progress Notes (Signed)
Virtual telephone visit    Virtual Visit via Telephone Note   This visit type was conducted due to national recommendations for restrictions regarding the COVID-19 Pandemic (e.g. social distancing) in an effort to limit this patient's exposure and mitigate transmission in our community. Due to his co-morbid illnesses, this patient is at least at moderate risk for complications without adequate follow up. This format is felt to be most appropriate for this patient at this time. The patient did not have access to video technology or had technical difficulties with video requiring transitioning to audio format only (telephone). Physical exam was limited to content and character of the telephone converstion.    Patient location: home Provider location: [facility]  I discussed the limitations of evaluation and management by telemedicine and the availability of in person appointments. The patient expressed understanding and agreed to proceed.   Visit Date: 03/03/2020  Today's healthcare provider: Lelon Huh, MD   Chief Complaint  Patient presents with  . Cough   Subjective    Cough This is a recurrent problem. The problem has been unchanged. Associated symptoms include headaches and shortness of breath (with exertion). Pertinent negatives include no chest pain, chills, fever or wheezing. He has tried oral steroids and prescription cough suppressant (also oral antibiotics, ) for the symptoms. The treatment provided no relief.   Patient was last seen via virtual visit on 02/25/2020 for cough and history of COVID. He was prescribed prednisone 10mg  taper, Tussionex cough syrup and a z pack. He reports good compliance with treatment, but feels his symptoms are not improving.       Medications: Outpatient Medications Prior to Visit  Medication Sig  . aspirin EC 81 MG EC tablet Take 1 tablet (81 mg total) by mouth daily. (Patient taking differently: Take 81 mg by mouth at bedtime.)  .  atorvastatin (LIPITOR) 80 MG tablet Take 1 tablet (80 mg total) by mouth daily.  . chlorpheniramine-HYDROcodone (TUSSIONEX PENNKINETIC ER) 10-8 MG/5ML SUER Take 5 mLs by mouth every 12 (twelve) hours as needed for cough.  . clopidogrel (PLAVIX) 75 MG tablet TAKE 1 TABLET BY MOUTH EVERY DAY  . metoprolol succinate (TOPROL XL) 50 MG 24 hr tablet Take 1 tablet (50 mg total) by mouth daily. Take with or immediately following a meal.  . nitroGLYCERIN (NITROSTAT) 0.4 MG SL tablet Place 1 tablet (0.4 mg total) under the tongue every 5 (five) minutes as needed for chest pain.  Marland Kitchen sertraline (ZOLOFT) 100 MG tablet TAKE 1 TABLET BY MOUTH EVERY DAY  . valsartan (DIOVAN) 160 MG tablet Take 1.5 tablets (240 mg total) by mouth daily.  . [DISCONTINUED] amoxicillin-clavulanate (AUGMENTIN) 875-125 MG tablet Take 1 tablet by mouth 2 (two) times daily.   No facility-administered medications prior to visit.    Review of Systems  Constitutional: Negative for appetite change, chills and fever.  Respiratory: Positive for cough and shortness of breath (with exertion). Negative for chest tightness and wheezing.   Cardiovascular: Negative for chest pain and palpitations.  Gastrointestinal: Negative for abdominal pain, nausea and vomiting.  Neurological: Positive for headaches.      Objective    There were no vitals taken for this visit.   Awake, alert, oriented x 3. In no apparent distress   Assessment & Plan     1. Chronic cough Post Covid syndrome. Briefly improved on high dose oral steroid, will start- Fluticasone-Salmeterol (ADVAIR) 250-50 MCG/DOSE AEPB; Inhale 1 puff into the lungs every 12 (twelve) hours.  Dispense:  60 each; Refill: 0  Call if not improving in 5-7 days.   2. History of COVID-19    No follow-ups on file.    I discussed the assessment and treatment plan with the patient. The patient was provided an opportunity to ask questions and all were answered. The patient agreed with the plan  and demonstrated an understanding of the instructions.   The patient was advised to call back or seek an in-person evaluation if the symptoms worsen or if the condition fails to improve as anticipated.  I provided 8 minutes of non-face-to-face time during this encounter.  The entirety of the information documented in the History of Present Illness, Review of Systems and Physical Exam were personally obtained by me. Portions of this information were initially documented by the CMA and reviewed by me for thoroughness and accuracy.     Lelon Huh, MD [facility] [phone] (phone) [phone] (fax)  [place]

## 2020-03-08 ENCOUNTER — Encounter: Payer: Self-pay | Admitting: Dermatology

## 2020-03-08 ENCOUNTER — Ambulatory Visit (INDEPENDENT_AMBULATORY_CARE_PROVIDER_SITE_OTHER): Payer: BC Managed Care – PPO | Admitting: Dermatology

## 2020-03-08 ENCOUNTER — Other Ambulatory Visit: Payer: Self-pay

## 2020-03-08 DIAGNOSIS — L813 Cafe au lait spots: Secondary | ICD-10-CM

## 2020-03-08 DIAGNOSIS — Z86018 Personal history of other benign neoplasm: Secondary | ICD-10-CM

## 2020-03-08 DIAGNOSIS — Z1283 Encounter for screening for malignant neoplasm of skin: Secondary | ICD-10-CM | POA: Diagnosis not present

## 2020-03-08 DIAGNOSIS — D1800 Hemangioma unspecified site: Secondary | ICD-10-CM

## 2020-03-08 DIAGNOSIS — L918 Other hypertrophic disorders of the skin: Secondary | ICD-10-CM

## 2020-03-08 DIAGNOSIS — L578 Other skin changes due to chronic exposure to nonionizing radiation: Secondary | ICD-10-CM | POA: Diagnosis not present

## 2020-03-08 DIAGNOSIS — L814 Other melanin hyperpigmentation: Secondary | ICD-10-CM

## 2020-03-08 DIAGNOSIS — D229 Melanocytic nevi, unspecified: Secondary | ICD-10-CM

## 2020-03-08 DIAGNOSIS — L821 Other seborrheic keratosis: Secondary | ICD-10-CM

## 2020-03-08 NOTE — Progress Notes (Signed)
   Follow-Up Visit   Subjective  Eduardo Poole is a 50 y.o. male who presents for the following: Annual Exam (Hx dysplastic nevi ). The patient presents for Total-Body Skin Exam (TBSE) for skin cancer screening and mole check.  The following portions of the chart were reviewed this encounter and updated as appropriate:   Tobacco  Allergies  Meds  Problems  Med Hx  Surg Hx  Fam Hx     Review of Systems:  No other skin or systemic complaints except as noted in HPI or Assessment and Plan.  Objective  Well appearing patient in no apparent distress; mood and affect are within normal limits.  A full examination was performed including scalp, head, eyes, ears, nose, lips, neck, chest, axillae, abdomen, back, buttocks, bilateral upper extremities, bilateral lower extremities, hands, feet, fingers, toes, fingernails, and toenails. All findings within normal limits unless otherwise noted below.  Objective  R post thigh: Brown macule   Assessment & Plan  Cafe au lait spot R post thigh Benign appearing birthmark, observe.  Skin cancer screening   Lentigines - Scattered tan macules - Discussed due to sun exposure - Benign, observe - Call for any changes  Seborrheic Keratoses - Stuck-on, waxy, tan-brown papules and plaques  - Discussed benign etiology and prognosis. - Observe - Call for any changes  Melanocytic Nevi - Tan-brown and/or pink-flesh-colored symmetric macules and papules - Benign appearing on exam today - Observation - Call clinic for new or changing moles - Recommend daily use of broad spectrum spf 30+ sunscreen to sun-exposed areas.   Hemangiomas - Red papules - Discussed benign nature - Observe - Call for any changes  Actinic Damage - Chronic, secondary to cumulative UV/sun exposure - diffuse scaly erythematous macules with underlying dyspigmentation - Recommend daily broad spectrum sunscreen SPF 30+ to sun-exposed areas, reapply every 2 hours as  needed.  - Call for new or changing lesions.  History of Dysplastic Nevi - No evidence of recurrence today - Recommend regular full body skin exams - Recommend daily broad spectrum sunscreen SPF 30+ to sun-exposed areas, reapply every 2 hours as needed.  - Call if any new or changing lesions are noted between office visits  Acrochordons (Skin Tags) - Fleshy, skin-colored pedunculated papules - Benign appearing.  - Observe. - If desired, they can be removed with an in office procedure that is not covered by insurance. - Please call the clinic if you notice any new or changing lesions.  Skin cancer screening performed today.  Return in about 1 year (around 03/08/2021) for TBSE.  Luther Redo, CMA, am acting as scribe for Sarina Ser, MD .  Documentation: I have reviewed the above documentation for accuracy and completeness, and I agree with the above.  Sarina Ser, MD

## 2020-03-12 ENCOUNTER — Encounter: Payer: Self-pay | Admitting: Dermatology

## 2020-03-21 ENCOUNTER — Other Ambulatory Visit: Payer: Self-pay | Admitting: Family Medicine

## 2020-04-02 ENCOUNTER — Other Ambulatory Visit: Payer: Self-pay | Admitting: Cardiovascular Disease

## 2020-05-19 ENCOUNTER — Ambulatory Visit (INDEPENDENT_AMBULATORY_CARE_PROVIDER_SITE_OTHER): Payer: BC Managed Care – PPO | Admitting: Cardiovascular Disease

## 2020-05-19 ENCOUNTER — Other Ambulatory Visit: Payer: Self-pay

## 2020-05-19 ENCOUNTER — Encounter: Payer: Self-pay | Admitting: Cardiovascular Disease

## 2020-05-19 VITALS — BP 162/94 | HR 64 | Ht 70.0 in | Wt 294.6 lb

## 2020-05-19 DIAGNOSIS — R002 Palpitations: Secondary | ICD-10-CM

## 2020-05-19 DIAGNOSIS — I10 Essential (primary) hypertension: Secondary | ICD-10-CM

## 2020-05-19 DIAGNOSIS — I2510 Atherosclerotic heart disease of native coronary artery without angina pectoris: Secondary | ICD-10-CM

## 2020-05-19 DIAGNOSIS — G4733 Obstructive sleep apnea (adult) (pediatric): Secondary | ICD-10-CM

## 2020-05-19 DIAGNOSIS — E785 Hyperlipidemia, unspecified: Secondary | ICD-10-CM

## 2020-05-19 MED ORDER — VALSARTAN 320 MG PO TABS: 320.0000 mg | ORAL_TABLET | Freq: Every day | ORAL | 3 refills | Status: DC

## 2020-05-19 NOTE — Progress Notes (Signed)
Cardiology Office Note    Date:  05/20/2020   ID:  Avishai, Reihl [birth year], MRN [id number]  PCP:  Birdie Sons, MD  Cardiologist:  Shelva Majestic, MD   11 month F/U evaluation  History of Present Illness:  JERRARD BRADBURN is a 50 y.o. male who suffered an acute coronary syndrome on June 2016. He initially presented [place] where his ECG showed 2 mm of inferior ST segment elevation and a code STEMI was activated. He also had transient TIA symptomatology. Emergent catheterization revealed an ulcerated plaque in the distal RCA which had reperfused on anticoagulation therapy. There was no residual high-grade stenosis. PTCA alone was performed and the lesion was not stented. His other coronary arteries were normal. Left ventriculography showed normal LV function and a normal appearing aortic anatomy. He was continued on Angiomax and started on aspirin and Brilinta. An echo Doppler study showed an EF of 60-65% without wall motion abnormalities. There was grade 1 diastolic dysfunction. Subsequently, he had noted some occasional PVCs but these have stabilized.  He developed some recurrent chest pain symptomatology in September 2016 which led to a nuclear stress test was wasn't interpreted as intermediate risk and suggesting possible scar/ischemia inferolaterally. Repeat cardiac catheterization was done in 11/10/2014 and this revealed normal coronary arteries without evidence for restenosis in the distal RCA at the site of the prior ulcerated plaque.   When I saw him in 2017 he continued to be stable. He denies chest pain, shortness of breath, or palpitations. He works as an Dispensing optician. I saw him in May 2017 for preoperative clearance prior to undergoing cervical disc surgery. He tolerated that well from a cardiovascular standpoint. He has remote history of suffering a DVT approximately 9 years ago and transiently was treated with Coumadin.  I  wasconcerned about the possibility of sleep apnea.He underwent a sleep study which was done at [facility]. He was told of having sleep apnea. He subsequently was started on CPAP therapy and since initiating therapy. He feels his sleep is significantly improved. He is sleeping on average at least 7-1/2-8 hours per night. He is unaware of breakthrough snoring. He admits to 100% compliance. He has a ResMed AirSense 10 unit with heated hose.  He presented to [facility] emergency room in September 2017 with somewhat different chest pain. At that time, he admitted that he was under significant stress that was work-related. Troponins and CT scan of his chest were negative. He has since changed jobs. He does the same type of work but works at home. He tries to be active and intermittently walks on a treadmill and does yard work.  He has been bothered by DJD of his thoracic back. He continues to use CPAP with 100% compliance and feels significantly better. He is on CPAP therapy at 13 cm water pressure continues to use this with 100% compliance.  Since I last saw him, he denies any recurrent episodes of chest pain, palpitations or shortness of breath. He does not routinely exercise. His job is sedentary. He is exercise has been limited by DJD and arthritis. He continues to use CPAP. His last laboratory was checked in December 2017 and at that time his LDL cholesterol was 47.  He was diagnosed with DVT based on venous Doppler on 07/31/2017 which revealed isolated calf DVT involving the posterior tibial vein. Hypercoagulable panel was ordered which came back negative. He was placed on 15-monthcourse of Xarelto then stop afterward. He  has been followed by hematology/oncology service and was told that if he was to have another episode of DVT, he will need lifelong treatment.  He was evaluated bt Almyra Deforest, PA on 04/17/2018 and was not longer on Xarelto.  He continued to  have intermittent leg cramps worse with ambulation. On physical exam, currently had some difficulty in distal pulse detection and he underwent lower extremity Doppler study which showed normal ABIs.  During that evaluation, blood pressure was elevated and valsartan was further titrated.  He was last evaluated by me in a telemedicine encounter on March 30, 2019.  At that time he admitted to be under  increased stress and times during very stressful moments admits to rare palpitations.  He denies associated chest tightness or chest pressure.  He has a history of significant obesity and his weight had peaked at 298 pounds but he recently has lost 21 pounds over 4 months.  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  He also admits to mild blood pressure elevation.  He has been on CPAP therapy and had a diagnostic polysomnogram performed in [place] in September 2017 which showed severe sleep apnea with an AHI of 36.4/h and respiratory disturbance index of 36.8/h.  REM related AHI and RDI were 49.7/h.  There was severe positional sleep apnea in the supine position with an AHI of 89.4/h.  He has a ResMed air sense 10 CPAP unit with set up date November 30, 2015.  At a 13 cm set pressure, AHI is excellent at 0.2.  Compliance is excellent with 97% of usage days and average usage 7 hours and 33 minutes per night.  He is in need of a new DME company.  During that evaluation he was in need for establishment with a new DME company in [place].  With his palpitations I recommended changing from metoprolol tartrate to metoprolol succinate 50 mg daily.  I last saw him in May 2021.  He continued to be dizzy at work and denied chest pain or shortness of breath appears his palpitations had improved.  He was on a medical regimen consisting of aspirin and Plavix, metoprolol succinate 50 mg daily and valsartan 160 mg for hypertension.  He is on atorvastatin 80 mg for hyperlipidemia and sertraline for anxiety.  A  new download in the office from 06/16/2019 through Jul 15, 2019 showed 100% compliance  with average usage 7 hours and 14 minutes.  At 13 cm water pressure AHI is excellent at 0.2.  He believes his DME company is [employer].  He has had consistent issues with his full facemask leak over the bridge of the nose.  During that evaluation, I recommended a new ResMed air fit F 30i mask where the tubing originates from the crown of the head and the nose portion is under the nose and not on the bridge of his nose.  Since I last saw him, he has continued to do well.  He developed Covid the week of Christmas despite having had his vaccinations.  He had significant cough that was persistent and transiently lost taste and smell.  He also admitted to persistent fatigability for at least 1 to 2 months thereafter.  He denies anginal symptoms.  He has not been able to do much exercise.  He sits down on a computer all day with work.  Blood pressure has been elevated.  He has not been successful with weight loss.  He presents for reevaluation.   Past Medical  History:  Diagnosis Date  . CAD (coronary artery disease)    a. 07/2014 Inf STEMI/PTCA:  LM nl, LAD nl, LCX nl, RCA ulcerated plaque (PTCA only);  b. 10/2014 MV: intermediate risk, ? inflat isch/scar;  c. 10/2014 Cath: Nl cors w/o RCA restenosis.  . Diastolic dysfunction    a. 07/2014 Echo: EF 60-65%, Gr1 DD.  . DVT of lower extremity (deep venous thrombosis) ([place])    a. ~ 2009, per pt was on lovenox/coumadin  . GERD (gastroesophageal reflux disease)   . History of dysplastic nevus 12/07/2018   right post waistline/moderate, left low back/moderate, left mid back lateral near side/moderate   . Hyperlipidemia   . Hypertension   . Hypertensive heart disease   . TIA (transient ischemic attack) 07/23/2014    Past Surgical History:  Procedure Laterality Date  . ARTHROPLASTY Right 07/09/2010  . CARDIAC CATHETERIZATION N/A 07/23/2014   Procedure: Left Heart Cath;  Surgeon:  Troy Sine, MD;  Location: [place] [facility];  Service: Cardiovascular;  Laterality: N/A;  . CARDIAC CATHETERIZATION N/A 07/23/2014   Procedure: Coronary Balloon Angioplasty;  Surgeon: Troy Sine, MD;  Location: [place] [facility];  Service: Cardiovascular;  Laterality: N/A;  . CARDIAC CATHETERIZATION N/A 11/10/2014   Procedure: Left Heart Cath and Coronary Angiography;  Surgeon: Troy Sine, MD;  Location: [place] [facility];  Service: Cardiovascular;  Laterality: N/A;  . CERVICAL [place] SURGERY  2015  . JOINT REPLACEMENT Right    Right knee  . MENISCUS REPAIR Right 11/03/1995   Meniscus Lateral Tear: repaired by Dr. Sabra Heck at [facility]  . NASAL SINUS SURGERY     Deviated Septum  . REPLACEMENT TOTAL KNEE Right 2011   [facility]    Current Medications: Outpatient Medications Prior to Visit  Medication Sig Dispense Refill  . aspirin EC 81 MG EC tablet Take 1 tablet (81 mg total) by mouth daily. (Patient taking differently: Take 81 mg by mouth at bedtime.)    . atorvastatin (LIPITOR) 80 MG tablet Take 1 tablet (80 mg total) by mouth daily. 90 tablet 0  . clopidogrel (PLAVIX) 75 MG tablet TAKE 1 TABLET BY MOUTH EVERY DAY 90 tablet 3  . metoprolol succinate (TOPROL-XL) 50 MG 24 hr tablet TAKE 1 TABLET (50 MG TOTAL) BY MOUTH DAILY. TAKE WITH OR IMMEDIATELY FOLLOWING A MEAL. 90 tablet 3  . nitroGLYCERIN (NITROSTAT) 0.4 MG SL tablet Place 1 tablet (0.4 mg total) under the tongue every 5 (five) minutes as needed for chest pain. 25 tablet 12  . sertraline (ZOLOFT) 100 MG tablet TAKE 1 TABLET BY MOUTH EVERY DAY 90 tablet 2  . valsartan (DIOVAN) 160 MG tablet Take 1.5 tablets (240 mg total) by mouth daily. 135 tablet 3  . Fluticasone-Salmeterol (ADVAIR) 250-50 MCG/DOSE AEPB Inhale 1 puff into the lungs every 12 (twelve) hours. 60 each 0   No facility-administered medications prior to visit.     Allergies:   Patient has no known allergies.   Social History    Socioeconomic History  . Marital status: Married    Spouse name: Not on file  . Number of children: 2  . Years of education: 41  . Highest education level: Not on file  Occupational History  . Occupation: Employed    Comment: Works at [employer]  . Smoking status: Never Smoker  . Smokeless tobacco: Former Systems developer    Types: Secondary school teacher  . Vaping Use: Never used  Substance and Sexual Activity  . Alcohol use: No    Alcohol/week: 1.0 standard drink    Types: 1 Standard drinks or equivalent per week    Comment: social drinker  . Drug use: No  . Sexual activity: Yes  Other Topics Concern  . Not on file  Social History Narrative  . Not on file   Social Determinants of Health   Financial Resource Strain: Not on file  Food Insecurity: Not on file  Transportation Needs: Not on file  Physical Activity: Not on file  Stress: Not on file  Social Connections: Not on file     Family History:  The patient's family history includes Arthritis in his mother and sister; Autoimmune disease in his father; Cancer in his maternal grandfather; Diabetes in his maternal grandfather and paternal grandfather; Diverticulitis in his mother; Gout in his father; Hypertension in his mother.   ROS General: Negative; No fevers, chills, or night sweats;  HEENT: Negative; No changes in vision or hearing, sinus congestion, difficulty swallowing Pulmonary: Negative; No cough, wheezing, shortness of breath, hemoptysis Cardiovascular: Negative; No chest pain, presyncope, syncope, palpitations GI: Negative; No nausea, vomiting, diarrhea, or abdominal pain GU: Negative; No dysuria, hematuria, or difficulty voiding Musculoskeletal: Negative; no myalgias, joint pain, or weakness Hematologic/Oncology: Negative; no easy bruising, bleeding Endocrine: Negative; no heat/cold intolerance; no diabetes Neuro: Negative; no changes in balance, headaches Skin: Negative; No rashes or skin  lesions Psychiatric: Negative; No behavioral problems, depression Sleep: Negative; No snoring, daytime sleepiness, hypersomnolence, bruxism, restless legs, hypnogognic hallucinations, no cataplexy Other comprehensive 14 point system review is negative.   PHYSICAL EXAM:   VS:  BP (!) 162/94   Pulse 64   Ht 5' 10"  (1.778 m)   Wt 294 lb 9.6 oz (133.6 kg)   SpO2 98%   BMI 42.27 kg/m     Repeat blood pressure by me was elevated at 160/92  Wt Readings from Last 3 Encounters:  05/19/20 294 lb 9.6 oz (133.6 kg)  10/12/19 294 lb (133.4 kg)  07/16/19 288 lb 9.6 oz (130.9 kg)    General: Alert, oriented, no distress.  Skin: normal turgor, no rashes, warm and dry HEENT: Normocephalic, atraumatic. Pupils equal round and reactive to light; sclera anicteric; extraocular muscles intact;  Nose without nasal septal hypertrophy Mouth/Parynx benign; Mallinpatti scale 3/4 Neck: Thick neck; no JVD, no carotid bruits; normal carotid upstroke Lungs: clear to ausculatation and percussion; no wheezing or rales Chest wall: without tenderness to palpitation Heart: PMI not displaced, RRR, s1 s2 normal, 1/6 systolic murmur, no diastolic murmur, no rubs, gallops, thrills, or heaves Abdomen: Central adiposity; soft, nontender; no hepatosplenomehaly, BS+; abdominal aorta nontender and not dilated by palpation. Back: no CVA tenderness Pulses 2+ Musculoskeletal: full range of motion, normal strength, no joint deformities Extremities: no clubbing cyanosis or edema, Homan's sign negative  Neurologic: grossly nonfocal; Cranial nerves grossly wnl Psychologic: Normal mood and affect   Studies/Labs Reviewed:   EKG:  EKG is ordered today. ECG (independently read by me): NSR at 64;  Baseline artifact; normal intervals   ECG (independently read by me): Sinus rhythm at 68 bpm, isolated PVC, mild RV conduction delay.  Recent Labs: BMP Latest Ref Rng & Units 10/12/2019 05/07/2019 10/08/2018  Glucose 65 - 99 mg/dL  100(H) 100(H) 102(H)  BUN 6 - 24 mg/dL 12 23 17   Creatinine 0.76 - 1.27 mg/dL 1.01 0.96 1.01  BUN/Creat Ratio 9 - 20 12 24(H) 17  Sodium 134 - 144 mmol/L 140 140  141  Potassium 3.5 - 5.2 mmol/L 4.6 4.6 4.7  Chloride 96 - 106 mmol/L 104 103 103  CO2 20 - 29 mmol/L 22 23 22   Calcium 8.7 - 10.2 mg/dL 9.3 9.7 9.6     Hepatic Function Latest Ref Rng & Units 10/12/2019 05/07/2019 10/08/2018  Total Protein 6.0 - 8.5 g/dL 6.9 7.2 6.7  Albumin 4.0 - 5.0 g/dL 4.3 4.5 4.2  AST 0 - 40 IU/L 27 29 32  ALT 0 - 44 IU/L 38 47(H) 46(H)  Alk Phosphatase 48 - 121 IU/L 83 85 81  Total Bilirubin 0.0 - 1.2 mg/dL 0.3 0.2 0.2  Bilirubin, Direct 0.1 - 0.5 mg/dL - - -    CBC Latest Ref Rng & Units 10/12/2019 10/08/2018 09/19/2017  WBC 3.4 - 10.8 x10E3/uL 7.4 8.1 8.0  Hemoglobin 13.0 - 17.7 g/dL 14.4 14.8 14.4  Hematocrit 37.5 - 51.0 % 42.4 44.6 44.8  Platelets 150 - 450 x10E3/uL 296 321 321   Lab Results  Component Value Date   MCV 90 10/12/2019   MCV 90 10/08/2018   MCV 91 09/19/2017   Lab Results  Component Value Date   TSH 2.620 10/12/2019   Lab Results  Component Value Date   HGBA1C 5.8 (H) 07/24/2014     BNP No results found for: BNP  ProBNP No results found for: PROBNP   Lipid Panel     Component Value Date/Time   CHOL 114 10/12/2019 1008   TRIG 103 10/12/2019 1008   HDL 30 (L) 10/12/2019 1008   CHOLHDL 3.8 10/12/2019 1008   CHOLHDL 3.9 02/06/2016 1058   VLDL 30 02/06/2016 1058   LDLCALC 65 10/12/2019 1008   LABVLDL 19 10/12/2019 1008     RADIOLOGY: No results found.   Additional studies/ records that were reviewed today include:  I obtained a new download of his CPAP use from April 28 through Jul 15, 2019.  ASSESSMENT:    1. Coronary artery disease involving native coronary artery of native heart without angina pectoris: PTCA  distal RCA June 2016   2. Essential hypertension   3. Palpitations   4. OSA (obstructive sleep apnea)   5. Hyperlipidemia LDL goal <70   6.  Morbid obesity due to excess calories [facility])    PLAN:   1. CAD: Mr. Janik suffered an acute coronary syndrome in June 2016 when he presented with 2 mm inferior ST elevation due to ulcerated plaque in his distal RCA and had reperfusion anticoagulation therapy.  PTCA alone was performed with excellent result.  Repeat catheterization in September 2016 revealed normal coronary arteries without restenosis.  He continues to be on DAPT with aspirin and Plavix and is tolerating this well without bleeding.  Presently, he has been without recurrent chest pain.  He denies any significant exertional dyspnea but admits that he is not very active.    2.  Essential hypertension: When I last evaluated him his blood pressure was elevated and I further titrated valsartan to 240 mg daily and he continues to be on metoprolol succinate 50 mg.  His blood pressure today continues to be increased and I will further titrate valsartan to 320 mg daily.  He will monitor his blood pressure.  If he continues to experience blood pressure elevation at  follow-up evaluation in 2 months I will most likely add amlodipine to his medical regimen.  3.  OSA: He continues to use CPAP with excellent compliance.  I obtained a new download from April 20, 2020 through  May 19, 2020.  Compliance is excellent with average 8 hours and 2 minutes of CPAP use per night.  AHI is excellent at 0.3.  He notes significant benefit since receiving his new mask.  And since he started the new mask leak is significantly decreased.  He is set at a 13 cm water pressure.  Epworth Sleepiness Scale score was recalculated in the office today and this endorsed at 6 arguing against residual daytime sleepiness.   4.  DVT: Resolved, no longer on anticoagulation therapy.  5.  Palpitations: Resolved with beta-blocker therapy, currently metoprolol succinate 50 mg daily.  6.  Hyperlipidemia: Target LDL less than 70.  He continues to be on atorvastatin 40 mg daily.  Most  recent laboratory October 12, 2019 shows LDL at 65.  7.  Obesity: Morbidly obese with further weight gain.  BMI is now 42.3.  I discussed the importance of weight loss, proper diet, and increased activity.   Medication Adjustments/Labs and Tests Ordered: Current medicines are reviewed at length with the patient today.  Concerns regarding medicines are outlined above.  Medication changes, Labs and Tests ordered today are listed in the Patient Instructions below. Patient Instructions  Medication Instructions:  Increase dose of Valsartan 320 mg  Daily   Continue all other medications *If you need a refill on your cardiac medications before your next appointment, please call your pharmacy*   Lab Work: Not needed   Testing/Procedures: Not needed   Follow-Up: At [facility], you and your health needs are our priority.  As part of our continuing mission to provide you with exceptional heart care, we have created designated Provider Care Teams.  These Care Teams include your primary Cardiologist (physician) and Advanced Practice Providers (APPs -  Physician Assistants and Nurse Practitioners) who all work together to provide you with the care you need, when you need it.  We recommend signing up for the patient portal called "MyChart".  Sign up information is provided on this After Visit Summary.  MyChart is used to connect with patients for Virtual Visits (Telemedicine).  Patients are able to view lab/test results, encounter notes, upcoming appointments, etc.  Non-urgent messages can be sent to your provider as well.   To learn more about what you can do with MyChart, go to [email or web address].    Your next appointment:   3 month(s)  The format for your next appointment:   In Person  Provider:   Shelva Majestic, MD       Signed, Shelva Majestic, MD  05/20/2020 11:49 AM    [place] [address] [place], [place]  [address] Phone: [phone]

## 2020-05-19 NOTE — Patient Instructions (Signed)
Medication Instructions:  Increase dose of Valsartan 320 mg  Daily   Continue all other medications *If you need a refill on your cardiac medications before your next appointment, please call your pharmacy*   Lab Work: Not needed   Testing/Procedures: Not needed   Follow-Up: At [facility], you and your health needs are our priority.  As part of our continuing mission to provide you with exceptional heart care, we have created designated Provider Care Teams.  These Care Teams include your primary Cardiologist (physician) and Advanced Practice Providers (APPs -  Physician Assistants and Nurse Practitioners) who all work together to provide you with the care you need, when you need it.  We recommend signing up for the patient portal called "MyChart".  Sign up information is provided on this After Visit Summary.  MyChart is used to connect with patients for Virtual Visits (Telemedicine).  Patients are able to view lab/test results, encounter notes, upcoming appointments, etc.  Non-urgent messages can be sent to your provider as well.   To learn more about what you can do with MyChart, go to [email or web address].    Your next appointment:   3 month(s)  The format for your next appointment:   In Person  Provider:   Shelva Majestic, MD

## 2020-05-20 ENCOUNTER — Encounter: Payer: Self-pay | Admitting: Cardiovascular Disease

## 2020-05-31 ENCOUNTER — Other Ambulatory Visit: Payer: Self-pay | Admitting: Cardiovascular Disease

## 2020-06-06 ENCOUNTER — Other Ambulatory Visit: Payer: Self-pay | Admitting: Family Medicine

## 2020-06-06 DIAGNOSIS — R053 Chronic cough: Secondary | ICD-10-CM

## 2020-06-07 ENCOUNTER — Telehealth: Payer: Self-pay

## 2020-06-07 MED ORDER — SERTRALINE HCL 100 MG PO TABS: 150.0000 mg | ORAL_TABLET | Freq: Every day | ORAL | 3 refills | Status: DC

## 2020-06-07 NOTE — Telephone Encounter (Signed)
Copied from [place] [id number]. Topic: General - Other >> Jun 07, 2020  8:48 AM Leward Quan A wrote: Reason for CRM: Patient called in to inquire of Dr Caryn Section to get a new Rx for sertraline (ZOLOFT) 100 MG tablet say that per Dr Caryn Section he could up the dose which he did and due to this he has ran out early and the pharmacy will not fill early they need a new Rx from doctor with the upped dose. Any questions please call patient at Ph# [phone]

## 2020-06-07 NOTE — Telephone Encounter (Signed)
I called patient to clarify message below.Patient says that during the last office visit from 10/12/2019 Dr. Caryn Section talked about increasing the dose of Sertraline. At that time patient didn't want to increase the dose. Since then patient has increased the dose of Sertraline to taking 1.5 tablets daily. So he is currently taking a total of Sertraline 150mg  daily. This dose has worked well for him. Patient has run out of medication and the pharmacy will not give him a refill because the prescription on file is for one tablet daily. Patient would like a new prescription sent in for Sertraline with the increased dose. Please advise.   [facility]

## 2020-06-07 NOTE — Telephone Encounter (Signed)
Patient advised.

## 2020-06-07 NOTE — Telephone Encounter (Signed)
[facility] prescription [facility] 1[facility] [place]

## 2020-06-19 ENCOUNTER — Other Ambulatory Visit: Payer: Self-pay | Admitting: Cardiovascular Disease

## 2020-07-03 ENCOUNTER — Encounter: Payer: Self-pay | Admitting: Cardiovascular Disease

## 2020-07-03 ENCOUNTER — Other Ambulatory Visit: Payer: Self-pay

## 2020-07-03 ENCOUNTER — Ambulatory Visit (INDEPENDENT_AMBULATORY_CARE_PROVIDER_SITE_OTHER): Payer: BC Managed Care – PPO | Admitting: Cardiovascular Disease

## 2020-07-03 VITALS — BP 154/92 | HR 53 | Ht 70.0 in | Wt 297.0 lb

## 2020-07-03 DIAGNOSIS — R002 Palpitations: Secondary | ICD-10-CM

## 2020-07-03 DIAGNOSIS — I10 Essential (primary) hypertension: Secondary | ICD-10-CM

## 2020-07-03 DIAGNOSIS — I2510 Atherosclerotic heart disease of native coronary artery without angina pectoris: Secondary | ICD-10-CM | POA: Diagnosis not present

## 2020-07-03 DIAGNOSIS — E785 Hyperlipidemia, unspecified: Secondary | ICD-10-CM | POA: Diagnosis not present

## 2020-07-03 MED ORDER — AMLODIPINE BESYLATE 5 MG PO TABS: 5.0000 mg | ORAL_TABLET | Freq: Every day | ORAL | 3 refills | Status: DC

## 2020-07-03 NOTE — Progress Notes (Signed)
Cardiology Office Note    Date:  07/10/2020   ID:  Eduardo Poole, Eduardo Poole 14-May-1970, MRN [id number]  PCP:  Birdie Sons, MD  Cardiologist:  Shelva Majestic, MD   6 week add-on F/U evaluation  History of Present Illness:  Eduardo Poole is a 50 y.o. male who suffered an acute coronary syndrome on June 2016. He initially presented [place] where his ECG showed 2 mm of inferior ST segment elevation and a code STEMI was activated. He also had transient TIA symptomatology. Emergent catheterization revealed an ulcerated plaque in the distal RCA which had reperfused on anticoagulation therapy. There was no residual high-grade stenosis. PTCA alone was performed and the lesion was not stented. His other coronary arteries were normal. Left ventriculography showed normal LV function and a normal appearing aortic anatomy. He was continued on Angiomax and started on aspirin and Brilinta. An echo Doppler study showed an EF of 60-65% without wall motion abnormalities. There was grade 1 diastolic dysfunction. Subsequently, he had noted some occasional PVCs but these have stabilized.  He developed some recurrent chest pain symptomatology in September 2016 which led to a nuclear stress test was wasn't interpreted as intermediate risk and suggesting possible scar/ischemia inferolaterally. Repeat cardiac catheterization was done in 11/10/2014 and this revealed normal coronary arteries without evidence for restenosis in the distal RCA at the site of the prior ulcerated plaque.   When I saw him in 2017 he continued to be stable. He denies chest pain, shortness of breath, or palpitations. He works as an Dispensing optician. I saw him in May 2017 for preoperative clearance prior to undergoing cervical disc surgery. He tolerated that well from a cardiovascular standpoint. He has remote history of suffering a DVT approximately 9 years ago and transiently was treated with Coumadin.  I  wasconcerned about the possibility of sleep apnea.He underwent a sleep study which was done at [facility]. He was told of having sleep apnea. He subsequently was started on CPAP therapy and since initiating therapy. He feels his sleep is significantly improved. He is sleeping on average at least 7-1/2-8 hours per night. He is unaware of breakthrough snoring. He admits to 100% compliance. He has a ResMed AirSense 10 unit with heated hose.  He presented to [facility] emergency room in September 2017 with somewhat different chest pain. At that time, he admitted that he was under significant stress that was work-related. Troponins and CT scan of his chest were negative. He has since changed jobs. He does the same type of work but works at home. He tries to be active and intermittently walks on a treadmill and does yard work.  He has been bothered by DJD of his thoracic back. He continues to use CPAP with 100% compliance and feels significantly better. He is on CPAP therapy at 13 cm water pressure continues to use this with 100% compliance.  Since I last saw him, he denies any recurrent episodes of chest pain, palpitations or shortness of breath. He does not routinely exercise. His job is sedentary. He is exercise has been limited by DJD and arthritis. He continues to use CPAP. His last laboratory was checked in December 2017 and at that time his LDL cholesterol was 47.  He was diagnosed with DVT based on venous Doppler on 07/31/2017 which revealed isolated calf DVT involving the posterior tibial vein. Hypercoagulable panel was ordered which came back negative. He was placed on 52-monthcourse of Xarelto then stop afterward.  He has been followed by hematology/oncology service and was told that if he was to have another episode of DVT, he will need lifelong treatment.  He was evaluated bt Almyra Deforest, PA on 04/17/2018 and was not longer on Xarelto.  He continued to  have intermittent leg cramps worse with ambulation. On physical exam, currently had some difficulty in distal pulse detection and he underwent lower extremity Doppler study which showed normal ABIs.  During that evaluation, blood pressure was elevated and valsartan was further titrated.  He was last evaluated by me in a telemedicine encounter on March 30, 2019.  At that time he admitted to be under  increased stress and times during very stressful moments admits to rare palpitations.  He denies associated chest tightness or chest pressure.  He has a history of significant obesity and his weight had peaked at 298 pounds but he recently has lost 21 pounds over 4 months.  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  He also admits to mild blood pressure elevation.  He has been on CPAP therapy and had a diagnostic polysomnogram performed in [place] in September 2017 which showed severe sleep apnea with an AHI of 36.4/h and respiratory disturbance index of 36.8/h.  REM related AHI and RDI were 49.7/h.  There was severe positional sleep apnea in the supine position with an AHI of 89.4/h.  He has a ResMed air sense 10 CPAP unit with set up date November 30, 2015.  At a 13 cm set pressure, AHI is excellent at 0.2.  Compliance is excellent with 97% of usage days and average usage 7 hours and 33 minutes per night.  He is in need of a new DME company.  During that evaluation he was in need for establishment with a new DME company in [place].  With his palpitations I recommended changing from metoprolol tartrate to metoprolol succinate 50 mg daily.  I last saw him in May 2021.  He continued to be dizzy at work and denied chest pain or shortness of breath appears his palpitations had improved.  He was on a medical regimen consisting of aspirin and Plavix, metoprolol succinate 50 mg daily and valsartan 160 mg for hypertension.  He is on atorvastatin 80 mg for hyperlipidemia and sertraline for anxiety.  A  new download in the office from 06/16/2019 through Jul 15, 2019 showed 100% compliance  with average usage 7 hours and 14 minutes.  At 13 cm water pressure AHI is excellent at 0.2.  He believes his DME company is [employer].  He has had consistent issues with his full facemask leak over the bridge of the nose.  During that evaluation, I recommended a new ResMed air fit F 30i mask where the tubing originates from the crown of the head and the nose portion is under the nose and not on the bridge of his nose.   He developed Covid the week of Christmas despite having had his vaccinations.  He had significant cough that was persistent and transiently lost taste and smell.  He also admitted to persistent fatigability for at least 1 to 2 months thereafter.    I last saw him on May 19, 2020 at which time he denied any recurrent anginal symptoms.  He had not been able to do much exercise.  He sits down on a computer all day with work.  Blood pressure has been elevated.  He has not been successful with weight loss.  During that evaluation,  I further titrated valsartan to 320 mg daily.  He was instructed to monitor his blood pressure closely.  , He admits to increased work-related stress.  This has resulted in his blood pressure becoming elevated to as high as 172/109 on 1 occasion.  He denies any chest pain.  He is unaware of palpitations.  He continues to be on aspirin and Plavix for DAPT and has been on metoprolol succinate 50 mg and valsartan 320 mg daily.  He has continued to use CPAP for obstructive sleep apnea and a download was obtained from April 14 through Jun 30, 2020 which shows excellent compliance with average use at 7 hours and 47 minutes per night.  At a 12 cm set pressure, AHI is excellent at 0.5.  He presents for evaluation Past Medical History:  Diagnosis Date  . CAD (coronary artery disease)    a. 07/2014 Inf STEMI/PTCA:  LM nl, LAD nl, LCX nl, RCA ulcerated plaque (PTCA only);  b. 10/2014 MV:  intermediate risk, ? inflat isch/scar;  c. 10/2014 Cath: Nl cors w/o RCA restenosis.  . Diastolic dysfunction    a. 07/2014 Echo: EF 60-65%, Gr1 DD.  . DVT of lower extremity (deep venous thrombosis) ([place])    a. ~ 2009, per pt was on lovenox/coumadin  . GERD (gastroesophageal reflux disease)   . History of dysplastic nevus 12/07/2018   right post waistline/moderate, left low back/moderate, left mid back lateral near side/moderate   . Hyperlipidemia   . Hypertension   . Hypertensive heart disease   . TIA (transient ischemic attack) 07/23/2014    Past Surgical History:  Procedure Laterality Date  . ARTHROPLASTY Right 07/09/2010  . CARDIAC CATHETERIZATION N/A 07/23/2014   Procedure: Left Heart Cath;  Surgeon: Troy Sine, MD;  Location: [place] [facility];  Service: Cardiovascular;  Laterality: N/A;  . CARDIAC CATHETERIZATION N/A 07/23/2014   Procedure: Coronary Balloon Angioplasty;  Surgeon: Troy Sine, MD;  Location: [place] [facility];  Service: Cardiovascular;  Laterality: N/A;  . CARDIAC CATHETERIZATION N/A 11/10/2014   Procedure: Left Heart Cath and Coronary Angiography;  Surgeon: Troy Sine, MD;  Location: [place] [facility];  Service: Cardiovascular;  Laterality: N/A;  . CERVICAL [place] SURGERY  2015  . JOINT REPLACEMENT Right    Right knee  . MENISCUS REPAIR Right 11/03/1995   Meniscus Lateral Tear: repaired by Dr. Sabra Heck at [facility]  . NASAL SINUS SURGERY     Deviated Septum  . REPLACEMENT TOTAL KNEE Right 2011   [facility]    Current Medications: Outpatient Medications Prior to Visit  Medication Sig Dispense Refill  . aspirin EC 81 MG EC tablet Take 1 tablet (81 mg total) by mouth daily. (Patient taking differently: Take 81 mg by mouth at bedtime.)    . atorvastatin (LIPITOR) 80 MG tablet TAKE 1 TABLET BY MOUTH EVERY DAY 90 tablet 0  . clopidogrel (PLAVIX) 75 MG tablet TAKE 1 TABLET BY MOUTH EVERY DAY 90 tablet 2  . metoprolol succinate  (TOPROL-XL) 50 MG 24 hr tablet TAKE 1 TABLET (50 MG TOTAL) BY MOUTH DAILY. TAKE WITH OR IMMEDIATELY FOLLOWING A MEAL. 90 tablet 3  . nitroGLYCERIN (NITROSTAT) 0.4 MG SL tablet Place 1 tablet (0.4 mg total) under the tongue every 5 (five) minutes as needed for chest pain. 25 tablet 12  . sertraline (ZOLOFT) 100 MG tablet Take 1.5 tablets (150 mg total) by mouth daily. PLEASE NOTE INCREASE TO 1 1/2 TABLETS (150MG) DAILY 135  tablet 3  . valsartan (DIOVAN) 320 MG tablet Take 1 tablet (320 mg total) by mouth daily. 90 tablet 3  . WIXELA INHUB 250-50 MCG/DOSE AEPB INHALE 1 PUFF BY MOUTH EVERY 12 HOURS 60 each 0   No facility-administered medications prior to visit.     Allergies:   Patient has no known allergies.   Social History   Socioeconomic History  . Marital status: Married    Spouse name: Not on file  . Number of children: 2  . Years of education: 63  . Highest education level: Not on file  Occupational History  . Occupation: Employed    Comment: Works at [employer]  . Smoking status: Never Smoker  . Smokeless tobacco: Former Systems developer    Types: Secondary school teacher  . Vaping Use: Never used  Substance and Sexual Activity  . Alcohol use: No    Alcohol/week: 1.0 standard drink    Types: 1 Standard drinks or equivalent per week    Comment: social drinker  . Drug use: No  . Sexual activity: Yes  Other Topics Concern  . Not on file  Social History Narrative  . Not on file   Social Determinants of Health   Financial Resource Strain: Not on file  Food Insecurity: Not on file  Transportation Needs: Not on file  Physical Activity: Not on file  Stress: Not on file  Social Connections: Not on file     Family History:  The patient's family history includes Arthritis in his mother and sister; Autoimmune disease in his father; Cancer in his maternal grandfather; Diabetes in his maternal grandfather and paternal grandfather; Diverticulitis in his mother; Gout in his  father; Hypertension in his mother.   ROS General: Negative; No fevers, chills, or night sweats;  HEENT: Negative; No changes in vision or hearing, sinus congestion, difficulty swallowing Pulmonary: Negative; No cough, wheezing, shortness of breath, hemoptysis Cardiovascular: See HPI GI: Negative; No nausea, vomiting, diarrhea, or abdominal pain GU: Negative; No dysuria, hematuria, or difficulty voiding Musculoskeletal: Negative; no myalgias, joint pain, or weakness Hematologic/Oncology: Negative; no easy bruising, bleeding Endocrine: Negative; no heat/cold intolerance; no diabetes Neuro: Negative; no changes in balance, headaches Skin: Negative; No rashes or skin lesions Psychiatric: Negative; No behavioral problems, depression Sleep: OSA on CPAP [place] diagnosed in 2017.  No breakthrough snoring, daytime sleepiness, hypersomnolence, bruxism, restless legs, hypnogognic hallucinations, no cataplexy Other comprehensive 14 point system review is negative.   PHYSICAL EXAM:   VS:  BP (!) 154/92   Pulse (!) 53   Ht 5' 10"  (1.778 m)   Wt 297 lb (134.7 kg)   SpO2 98%   BMI 42.62 kg/m     Repeat blood pressure by me was 150/90  Wt Readings from Last 3 Encounters:  07/03/20 297 lb (134.7 kg)  05/19/20 294 lb 9.6 oz (133.6 kg)  10/12/19 294 lb (133.4 kg)    General: Alert, oriented, no distress.  Skin: normal turgor, no rashes, warm and dry HEENT: Normocephalic, atraumatic. Pupils equal round and reactive to light; sclera anicteric; extraocular muscles intact;  Nose without nasal septal hypertrophy Mouth/Parynx benign; Mallinpatti scale 3/4 Neck: No JVD, no carotid bruits; normal carotid upstroke Lungs: clear to ausculatation and percussion; no wheezing or rales Chest wall: without tenderness to palpitation Heart: PMI not displaced, RRR, s1 s2 normal, 1/6 systolic murmur, no diastolic murmur, no rubs, gallops, thrills, or heaves Abdomen: Central adiposity; soft, nontender; no  hepatosplenomehaly, BS+; abdominal aorta nontender and  not dilated by palpation. Back: no CVA tenderness Pulses 2+ Musculoskeletal: full range of motion, normal strength, no joint deformities Extremities: no clubbing cyanosis or edema, Homan's sign negative  Neurologic: grossly nonfocal; Cranial nerves grossly wnl Psychologic: Normal mood and affect   Studies/Labs Reviewed:   EKG:  EKG is ordered today.ECG (independently read by me): Sinus bradycardia at 53  April 2022 ECG (independently read by me): NSR at 64;  Baseline artifact; normal intervals   ECG (independently read by me): Sinus rhythm at 68 bpm, isolated PVC, mild RV conduction delay.  Recent Labs: BMP Latest Ref Rng & Units 10/12/2019 05/07/2019 10/08/2018  Glucose 65 - 99 mg/dL 100(H) 100(H) 102(H)  BUN 6 - 24 mg/dL 12 23 17   Creatinine 0.76 - 1.27 mg/dL 1.01 0.96 1.01  BUN/Creat Ratio 9 - 20 12 24(H) 17  Sodium 134 - 144 mmol/L 140 140 141  Potassium 3.5 - 5.2 mmol/L 4.6 4.6 4.7  Chloride 96 - 106 mmol/L 104 103 103  CO2 20 - 29 mmol/L 22 23 22   Calcium 8.7 - 10.2 mg/dL 9.3 9.7 9.6     Hepatic Function Latest Ref Rng & Units 10/12/2019 05/07/2019 10/08/2018  Total Protein 6.0 - 8.5 g/dL 6.9 7.2 6.7  Albumin 4.0 - 5.0 g/dL 4.3 4.5 4.2  AST 0 - 40 IU/L 27 29 32  ALT 0 - 44 IU/L 38 47(H) 46(H)  Alk Phosphatase 48 - 121 IU/L 83 85 81  Total Bilirubin 0.0 - 1.2 mg/dL 0.3 0.2 0.2  Bilirubin, Direct 0.1 - 0.5 mg/dL - - -    CBC Latest Ref Rng & Units 10/12/2019 10/08/2018 09/19/2017  WBC 3.4 - 10.8 x10E3/uL 7.4 8.1 8.0  Hemoglobin 13.0 - 17.7 g/dL 14.4 14.8 14.4  Hematocrit 37.5 - 51.0 % 42.4 44.6 44.8  Platelets 150 - 450 x10E3/uL 296 321 321   Lab Results  Component Value Date   MCV 90 10/12/2019   MCV 90 10/08/2018   MCV 91 09/19/2017   Lab Results  Component Value Date   TSH 2.620 10/12/2019   Lab Results  Component Value Date   HGBA1C 5.8 (H) 07/24/2014     BNP No results found for: BNP  ProBNP No  results found for: PROBNP   Lipid Panel     Component Value Date/Time   CHOL 114 10/12/2019 1008   TRIG 103 10/12/2019 1008   HDL 30 (L) 10/12/2019 1008   CHOLHDL 3.8 10/12/2019 1008   CHOLHDL 3.9 02/06/2016 1058   VLDL 30 02/06/2016 1058   LDLCALC 65 10/12/2019 1008   LABVLDL 19 10/12/2019 1008     RADIOLOGY: No results found.   Additional studies/ records that were reviewed today include:  I obtained a new download of his CPAP use from April 28 through Jul 15, 2019.  ASSESSMENT:    1. Essential hypertension   2. Coronary artery disease involving native coronary artery of native heart without angina pectoris: PTCA  distal RCA June 2016   3. Hyperlipidemia LDL goal <70   4. Palpitations   5. Morbid obesity due to excess calories [facility])    PLAN:   1. CAD: Mr. Araki suffered an acute coronary syndrome in June 2016 when he presented with 2 mm inferior ST elevation due to ulcerated plaque in his distal RCA and had reperfusion anticoagulation therapy.  PTCA alone was performed with excellent result.  Repeat catheterization in September 2016 revealed normal coronary arteries without restenosis.  Presently he is doing well without recurrent  anginal symptoms and continues to be on DAPT with aspirin/Plavix.  He is tolerating this well without bleeding.  Remains active and denies any exertional dyspnea.  2.  Essential hypertension: Despite recent further titration of valsartan to 320 mg daily and his concomitant metoprolol succinate 50 mg, blood pressure today remains elevated.  He also admits to increased work-related stress contributing to his blood pressure elevation.  His resting pulse is 53 and for this reason I will not further titrate his beta-blocker therapy.  I will institute amlodipine 5 mg to take in addition to his current valsartan and metoprolol regimen  3.  OSA: He continues to use CPAP with excellent compliance.  I obtained a new download from April 14 through Jun 30, 2020.  Compliance remains excellent.  AHI is 0.5 at 12 cm water pressure.  His previous mask leak has significantly improved.  There is no residual daytime sleepiness or breakthrough snoring.  4.  DVT: Resolved, no longer on anticoagulation therapy.  5.  Palpitations: Resolved with beta-blocker therapy, currently tolerating metoprolol succinate 50 mg daily.  6.  Hyperlipidemia: Target LDL less than 70.  He continues to be on atorvastatin 40 mg daily.  Most recent laboratory October 12, 2019 menstruated an LDL at 32.  7.  Obesity: Morbidly obese with further weight gain.  BMI today is 42.62.  I again discussed the importance of exercise, proper diet and weight loss.  Patient already has a follow-up appointment to see me on September 11, 2020 and I recommended he continue to keep this appointment.   Medication Adjustments/Labs and Tests Ordered: Current medicines are reviewed at length with the patient today.  Concerns regarding medicines are outlined above.  Medication changes, Labs and Tests ordered today are listed in the Patient Instructions below. Patient Instructions  Medication Instructions:  BEGIN amlodipine 53m once daily  *If you need a refill on your cardiac medications before your next appointment, please call your pharmacy*   Lab Work: None ordered.     Testing/Procedures: None ordered   Follow-Up: [facility] and your [facility] needs are our priority.  As part of our continuing mission to provide you with exceptional heart care, we have created designated Provider Care Teams.  These Care Teams include your primary Cardiologist (physician) and Advanced Practice Providers (APPs -  Physician Assistants and Nurse Practitioners) who all work together to provide you with the care you need, when you need it.  We recommend signing up for the patient portal called "MyChart".  Sign up information is provided on this After Visit Summary.  MyChart is used to connect with patients  for Virtual Visits (Telemedicine).  Patients are able to view lab/test results, encounter notes, upcoming appointments, etc.  Non-urgent messages can be sent to your provider as well.   To learn more about what you can do with MyChart, go to [email or web address]    Your next appointment:   Please keep your upcoming appointment in July   Other Instructions Monitor and record your blood pressure at home.      Signed, TShelva Majestic MD  07/10/2020 8:38 PM    [facility] [address] [place]  [id number]: [phone]

## 2020-07-03 NOTE — Patient Instructions (Signed)
Medication Instructions:  BEGIN amlodipine 5mg  once daily  *If you need a refill on your cardiac medications before your next appointment, please call your pharmacy*   Lab Work: None ordered.     Testing/Procedures: None ordered   Follow-Up: At [facility], you and your health needs are our priority.  As part of our continuing mission to provide you with exceptional heart care, we have created designated Provider Care Teams.  These Care Teams include your primary Cardiologist (physician) and Advanced Practice Providers (APPs -  Physician Assistants and Nurse Practitioners) who all work together to provide you with the care you need, when you need it.  We recommend signing up for the patient portal called "MyChart".  Sign up information is provided on this After Visit Summary.  MyChart is used to connect with patients for Virtual Visits (Telemedicine).  Patients are able to view lab/test results, encounter notes, upcoming appointments, etc.  Non-urgent messages can be sent to your provider as well.   To learn more about what you can do with MyChart, go to [email or web address].    Your next appointment:   Please keep your upcoming appointment in July   Other Instructions Monitor and record your blood pressure at home.

## 2020-07-04 ENCOUNTER — Other Ambulatory Visit: Payer: Self-pay | Admitting: Family Medicine

## 2020-07-04 DIAGNOSIS — R053 Chronic cough: Secondary | ICD-10-CM

## 2020-07-10 ENCOUNTER — Encounter: Payer: Self-pay | Admitting: Cardiovascular Disease

## 2020-08-09 ENCOUNTER — Other Ambulatory Visit: Payer: Self-pay | Admitting: Cardiovascular Disease

## 2020-08-09 DIAGNOSIS — I119 Hypertensive heart disease without heart failure: Secondary | ICD-10-CM

## 2020-08-27 ENCOUNTER — Other Ambulatory Visit: Payer: Self-pay | Admitting: Cardiovascular Disease

## 2020-09-11 ENCOUNTER — Other Ambulatory Visit: Payer: Self-pay

## 2020-09-11 ENCOUNTER — Ambulatory Visit (INDEPENDENT_AMBULATORY_CARE_PROVIDER_SITE_OTHER): Payer: BC Managed Care – PPO | Admitting: Cardiovascular Disease

## 2020-09-11 ENCOUNTER — Encounter: Payer: Self-pay | Admitting: Cardiovascular Disease

## 2020-09-11 DIAGNOSIS — G4733 Obstructive sleep apnea (adult) (pediatric): Secondary | ICD-10-CM

## 2020-09-11 DIAGNOSIS — I10 Essential (primary) hypertension: Secondary | ICD-10-CM

## 2020-09-11 DIAGNOSIS — I2510 Atherosclerotic heart disease of native coronary artery without angina pectoris: Secondary | ICD-10-CM

## 2020-09-11 DIAGNOSIS — E785 Hyperlipidemia, unspecified: Secondary | ICD-10-CM

## 2020-09-11 DIAGNOSIS — R002 Palpitations: Secondary | ICD-10-CM

## 2020-09-11 MED ORDER — VALSARTAN 320 MG PO TABS: 320.0000 mg | ORAL_TABLET | Freq: Every day | ORAL | 3 refills | Status: DC

## 2020-09-11 MED ORDER — METOPROLOL SUCCINATE ER 50 MG PO TB24: 75.0000 mg | ORAL_TABLET | Freq: Every day | ORAL | 3 refills | Status: DC

## 2020-09-11 NOTE — Progress Notes (Signed)
Cardiology Office Note    Date:  09/11/2020   ID:  Bronco, Mcgrory 1970/02/25, MRN [id number]  PCP:  Birdie Sons, MD  Cardiologist:  Shelva Majestic, MD   2 month  F/U evaluation  History of Present Illness:  Eduardo Poole is a 50 y.o. male who has a history of CAD, hypertension, obesity, and obstructive sleep apnea on CPAP therapy.  He presents for 34-monthfollow-up evaluation.    Mr. RSermersheimsuffered an acute coronary syndrome on June 2016. [place] 2 mm of inferior ST segment elevation and a code STEMI was activated.  He also had transient TIA symptomatology. Emergent catheterization revealed an ulcerated plaque in the distal RCA which had reperfused on anticoagulation therapy.  There was no residual high-grade stenosis.  PTCA alone was performed and the lesion was not stented.  His other coronary arteries were normal.  Left ventriculography showed normal LV function and a normal appearing aortic anatomy.  He was continued on Angiomax and started on aspirin and Brilinta.    An echo Doppler study showed an EF of 60-65% without wall motion abnormalities.  There was grade 1 diastolic dysfunction. Subsequently, he had noted some occasional PVCs but these have stabilized.   He developed some recurrent chest pain symptomatology in September 2016 which led to a nuclear stress test was wasn't interpreted as intermediate risk and suggesting possible scar/ischemia inferolaterally.  Repeat cardiac catheterization was done in 11/10/2014 and this revealed normal coronary arteries without evidence for restenosis in the distal RCA at the site of the prior ulcerated plaque.     When I saw him in 2017 he continued to be stable.  He denies chest pain, shortness of breath, or palpitations.  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  He tolerated that well  from a cardiovascular standpoint. He has remote history of suffering a DVT approximately 9 years ago and transiently was treated with Coumadin.    I was concerned about the possibility of sleep apnea.  [facility]  He was told of having sleep apnea.  He subsequently was started on CPAP therapy and since initiating therapy.  He feels his sleep is significantly improved.  He is sleeping on average at least 7-1/2-8 hours per night.  He is unaware of breakthrough snoring.  He admits to 100% compliance.  He has a ResMed AirSense 10 unit with heated hose.   [facility] with somewhat different chest pain.  At that time, he admitted that he was under significant stress that was work-related.  Troponins and CT scan of his chest were negative.  He has since changed jobs.  He does the same type of work but works at home.  He tries to be active and intermittently walks on a treadmill and does yard work.   He has been bothered by DJD of his thoracic back.  He continues to use CPAP with 100% compliance and feels significantly better.  He is on CPAP therapy at 13 cm water pressure continues to use this with 100% compliance.   Since I last saw him, he denies any recurrent episodes of chest pain, palpitations or shortness of breath.  He does not routinely exercise.  His job is sedentary.  He is exercise has  been limited by DJD and arthritis.  He continues to use CPAP.  His last laboratory was checked in December 2017 and at that time his LDL cholesterol was 47.    He was diagnosed with DVT based on venous Doppler on 07/31/2017 which revealed isolated calf DVT involving the posterior tibial vein.  Hypercoagulable panel was ordered which came back negative.  He was placed on 71-monthcourse of Xarelto then stop afterward.  He has been followed by hematology/oncology service and was told that if he was to have  another episode of DVT, he will need lifelong treatment.   He was evaluated bt HAlmyra Deforest PA on 04/17/2018 and was not longer on Xarelto.   He continued to have intermittent leg cramps worse with ambulation.  On physical exam, currently had some difficulty in distal pulse detection and he underwent lower extremity Doppler study which showed normal ABIs.  During that evaluation, blood pressure was elevated and valsartan was further titrated.   He was last evaluated by me in a telemedicine encounter on March 30, 2019.  At that time he admitted to be under  increased stress and times during very stressful moments admits to rare palpitations.  He denies associated chest tightness or chest pressure.  He has a history of significant obesity and his weight had peaked at 298 pounds but he recently has lost 21 pounds over 4 months.  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  He has been on CPAP therapy and [place] which showed severe sleep apnea with an AHI of 36.4/h and respiratory disturbance index of 36.8/h.  REM related AHI and RDI were 49.7/h.  There was severe positional sleep apnea in the supine position with an AHI of 89.4/h.  He has a ResMed air sense 10 CPAP unit with set up date November 30, 2015.  At a 13 cm set pressure, AHI is excellent at 0.2.  Compliance is excellent with 97% of usage days and average usage 7 hours and 33 minutes per night.  He is in need of a new DME company.  During that evaluation he was in need for [place]  With his palpitations I recommended changing from metoprolol tartrate to metoprolol succinate 50 mg daily.  I last saw him in May 2021.  He continued to be dizzy at work and denied chest pain or shortness of breath appears his palpitations had improved.  He was on a medical regimen consisting of aspirin and  Plavix, metoprolol succinate 50 mg daily and valsartan 160 mg for hypertension.  He is on atorvastatin 80 mg for hyperlipidemia and sertraline for anxiety.  A new download in the office from 06/16/2019 through Jul 15, 2019 showed 100% compliance  with average usage 7 hours and 14 minutes.  At 13 cm water pressure AHI is excellent at 0.2.  He believes his DME company is [employer].  He has had consistent issues with his full facemask leak over the bridge of the nose.  During that evaluation, I recommended a new ResMed air fit F 30i mask where the tubing originates from the crown of the head and the nose portion is under the nose and not on the bridge of his nose.  He developed Covid the week of Christmas despite having had his vaccinations.  He had significant cough that was persistent and transiently  lost taste and smell.  He also admitted to persistent fatigability for at least 1 to 2 months thereafter.    I saw him on May 19, 2020 at which time he denied any recurrent anginal symptoms.  He had not been able to do much exercise.  He sits down on a computer all day with work.  Blood pressure has been elevated.  He has not been successful with weight loss.  During that evaluation, I further titrated valsartan to 320 mg daily.  He was instructed to monitor his blood pressure closely.  I saw him as an add-on on Jul 03, 2020.  At that time he admitted to significantly increased work-related stress which resulted in blood pressure elevation to as high as 172/109 on 1 occasion.  He denied any chest pain.  He was unaware of palpitations.  He continued to be on aspirin and Plavix for DAPT and has been on metoprolol succinate 50 mg and valsartan 320 mg daily.  He has continued to use CPAP for obstructive sleep apnea and a download was obtained from April 14 through Jun 30, 2020 which shows excellent compliance with average use at 7 hours and 47 minutes per night.  At a 12 cm set pressure, AHI is excellent at 0.5.  During  that evaluation, I initiated amlodipine 5 mg for more optimal blood pressure control.  Since I last saw him, he has been monitoring his blood pressure regularly.  Blood pressure at home typically has been ranging from the mid 130s up to 150.  He states on 1 occasion his heart rate had increased to 100 when he was sitting down.  He had fallen 2 weeks ago when he slipped on bleachers on a rainy day watching his son's baseball game.  The fell and since that time has had bilateral knee discomfort.  He is status post prior knee surgeries.  As result over the last 2 weeks he has not been as active as he had been this has resulted in over 10 pounds of weight gain.  Presently he denies chest pain.  He is unaware of palpitations.  He will be seeing his primary MD next month.  He continues to use CPAP with excellent compliance.  A download was obtained in the office today 126 through September 11, 2020.  Usage is 100% with average use of 7 hours and 33 minutes.  AHI is excellent at 0.3 at 13.4 cm of water pressure.  He presents for follow-up evaluation.  Past Medical History:  Diagnosis Date   CAD (coronary artery disease)    a. 07/2014 Inf STEMI/PTCA:  LM nl, LAD nl, LCX nl, RCA ulcerated plaque (PTCA only);  b. 10/2014 MV: intermediate risk, ? inflat isch/scar;  c. 10/2014 Cath: Nl cors w/o RCA restenosis.   Diastolic dysfunction    a. 07/2014 Echo: EF 60-65%, Gr1 DD.   DVT of lower extremity (deep venous thrombosis) ([place])    a. ~ 2009, per pt was on lovenox/coumadin   GERD (gastroesophageal reflux disease)    History of dysplastic nevus 12/07/2018   right post waistline/moderate, left low back/moderate, left mid back lateral near side/moderate    Hyperlipidemia    Hypertension    Hypertensive heart disease    TIA (transient ischemic attack) 07/23/2014    Past Surgical History:  Procedure Laterality Date   ARTHROPLASTY Right 07/09/2010   CARDIAC CATHETERIZATION N/A 07/23/2014   Procedure: Left Heart Cath;   Surgeon: Troy Sine, MD;  Location: [place]  CV LAB;  Service: Cardiovascular;  Laterality: N/A;   CARDIAC CATHETERIZATION N/A 07/23/2014   Procedure: Coronary Balloon Angioplasty;  Surgeon: Troy Sine, MD;  Location: [place] CV LAB;  Service: Cardiovascular;  Laterality: N/A;   CARDIAC CATHETERIZATION N/A 11/10/2014   Procedure: Left Heart Cath and Coronary Angiography;  Surgeon: Troy Sine, MD;  Location: [place] CV LAB;  Service: Cardiovascular;  Laterality: N/A;   CERVICAL DISC SURGERY  2015   JOINT REPLACEMENT Right    Right knee   MENISCUS REPAIR Right 11/03/1995   Meniscus Lateral Tear: repaired by Dr. Sabra Heck at [facility]   REPLACEMENT TOTAL KNEE Right 2011   [facility]    Current Medications: Outpatient Medications Prior to Visit  Medication Sig Dispense Refill   amLODipine (NORVASC) 5 MG tablet Take 1 tablet (5 mg total) by mouth daily. 90 tablet 3   aspirin EC 81 MG EC tablet Take 1 tablet (81 mg total) by mouth daily. (Patient taking differently: Take 81 mg by mouth at bedtime.)     atorvastatin (LIPITOR) 80 MG tablet TAKE 1 TABLET BY MOUTH EVERY DAY 90 tablet 0   clopidogrel (PLAVIX) 75 MG tablet TAKE 1 TABLET BY MOUTH EVERY DAY 90 tablet 2   sertraline (ZOLOFT) 100 MG tablet Take 1.5 tablets (150 mg total) by mouth daily. PLEASE NOTE INCREASE TO 1 1/2 TABLETS (150MG) DAILY 135 tablet 3   metoprolol succinate (TOPROL-XL) 50 MG 24 hr tablet TAKE 1 TABLET (50 MG TOTAL) BY MOUTH DAILY. TAKE WITH OR IMMEDIATELY FOLLOWING A MEAL. 90 tablet 3   valsartan (DIOVAN) 320 MG tablet Take 1 tablet (320 mg total) by mouth daily. 90 tablet 3   nitroGLYCERIN (NITROSTAT) 0.4 MG SL tablet Place 1 tablet (0.4 mg total) under the tongue every 5 (five) minutes as needed for chest pain. 25 tablet 12   No facility-administered medications prior to visit.     Allergies:   Patient has no known allergies.    Social History   Socioeconomic History   Marital status: Married    Spouse name: Not on file   Number of children: 2   Years of education: 12   Highest education level: Not on file  Occupational History   Occupation: Employed    Comment: Works at [employer] Use   Smoking status: Never   Smokeless tobacco: Former    Types: Nurse, children's Use: Never used  Substance and Sexual Activity   Alcohol use: No    Alcohol/week: 1.0 standard drink    Types: 1 Standard drinks or equivalent per week    Comment: social drinker   Drug use: No   Sexual activity: Yes  Other Topics Concern   Not on file  Social History Narrative   Not on file   Social Determinants of Health   Financial Resource Strain: Not on file  Food Insecurity: Not on file  Transportation Needs: Not on file  Physical Activity: Not on file  Stress: Not on file  Social Connections: Not on file     Family History:  The patient's family history includes Arthritis in his mother and sister; Autoimmune disease in his father; Cancer in his maternal grandfather; Diabetes in his maternal grandfather and paternal grandfather; Diverticulitis in his mother; Gout in his father; Hypertension in his mother.   ROS General: Negative; No fevers, chills, or night sweats;  HEENT: Negative; No changes in vision or hearing, sinus congestion, difficulty swallowing Pulmonary: Negative; No cough, wheezing, shortness of breath, hemoptysis Cardiovascular: See HPI GI: Negative; No nausea, vomiting, diarrhea, or abdominal pain GU: Negative; No dysuria, hematuria, or difficulty voiding Musculoskeletal: Negative; no myalgias, joint pain, or weakness Hematologic/Oncology: Negative; no easy bruising, bleeding Endocrine: Negative; no heat/cold intolerance; no diabetes Neuro: Negative; no changes in balance, headaches Skin: Negative; No rashes or skin lesions Psychiatric: Negative; No behavioral problems,  depression Sleep: OSA on CPAP Ashley diagnosed in 2017.  No breakthrough snoring, daytime sleepiness, hypersomnolence, bruxism, restless legs, hypnogognic hallucinations, no cataplexy Other comprehensive 14 point system review is negative.   PHYSICAL EXAM:   VS:  BP 132/84 (BP Location: Left Arm, Patient Position: Sitting, Cuff Size: Large)   Pulse 72   Ht 5' 10"  (1.778 m)   Wt 288 lb 3.2 oz (130.7 kg)   SpO2 96%   BMI 41.35 kg/m     Repeat blood pressure by me was 150/90  Wt Readings from Last 3 Encounters:  09/11/20 288 lb 3.2 oz (130.7 kg)  07/03/20 297 lb (134.7 kg)  05/19/20 294 lb 9.6 oz (133.6 kg)    General: Alert, oriented, no distress.  Skin: normal turgor, no rashes, warm and dry HEENT: Normocephalic, atraumatic. Pupils equal round and reactive to light; sclera anicteric; extraocular muscles intact;  Nose without nasal septal hypertrophy Mouth/Parynx benign; Mallinpatti scale 3/4 Neck: Thick neck; no JVD, no carotid bruits; normal carotid upstroke Lungs: clear to ausculatation and percussion; no wheezing or rales Chest wall: without tenderness to palpitation Heart: PMI not displaced, RRR, s1 s2 normal, 1/6 systolic murmur, no diastolic murmur, no rubs, gallops, thrills, or heaves Abdomen: Central adiposity; soft, nontender; no hepatosplenomehaly, BS+; abdominal aorta nontender and not dilated by palpation. Back: no CVA tenderness Pulses 2+ Musculoskeletal: full range of motion, normal strength, no joint deformities Extremities: no clubbing cyanosis or edema, Homan's sign negative  Neurologic: grossly nonfocal; Cranial nerves grossly wnl Psychologic: Normal mood and affect    Studies/Labs Reviewed:   EKG:  EKG is ordered today. ECG (independently read by me): Sinus rhythm at 72 bpm.  No ectopy.  Normal intervals.  Jul 03 2020 ECG (independently read by me): Sinus bradycardia at 53  April 2022 ECG (independently read by me): NSR at 64;  Baseline artifact; normal  intervals   ECG (independently read by me): Sinus rhythm at 68 bpm, isolated PVC, mild RV conduction delay.  Recent Labs: BMP Latest Ref Rng & Units 10/12/2019 05/07/2019 10/08/2018  Glucose 65 - 99 mg/dL 100(H) 100(H) 102(H)  BUN 6 - 24 mg/dL 12 23 17   Creatinine 0.76 - 1.27 mg/dL 1.01 0.96 1.01  BUN/Creat Ratio 9 - 20 12 24(H) 17  Sodium 134 - 144 mmol/L 140 140 141  Potassium 3.5 - 5.2 mmol/L 4.6 4.6 4.7  Chloride 96 - 106 mmol/L 104 103 103  CO2 20 - 29 mmol/L 22 23 22   Calcium 8.7 - 10.2 mg/dL 9.3 9.7 9.6     Hepatic Function Latest Ref Rng & Units 10/12/2019 05/07/2019 10/08/2018  Total Protein 6.0 - 8.5 g/dL 6.9 7.2 6.7  Albumin 4.0 - 5.0 g/dL 4.3 4.5 4.2  AST 0 - 40 IU/L 27 29 32  ALT 0 - 44 IU/L 38 47(H) 46(H)  Alk Phosphatase 48 - 121 IU/L 83 85 81  Total Bilirubin 0.0 - 1.2 mg/dL 0.3 0.2 0.2  Bilirubin, Direct 0.1 - 0.5 mg/dL - - -  CBC Latest Ref Rng & Units 10/12/2019 10/08/2018 09/19/2017  WBC 3.4 - 10.8 x10E3/uL 7.4 8.1 8.0  Hemoglobin 13.0 - 17.7 g/dL 14.4 14.8 14.4  Hematocrit 37.5 - 51.0 % 42.4 44.6 44.8  Platelets 150 - 450 x10E3/uL 296 321 321   Lab Results  Component Value Date   MCV 90 10/12/2019   MCV 90 10/08/2018   MCV 91 09/19/2017   Lab Results  Component Value Date   TSH 2.620 10/12/2019   Lab Results  Component Value Date   HGBA1C 5.8 (H) 07/24/2014     BNP No results found for: BNP  ProBNP No results found for: PROBNP   Lipid Panel     Component Value Date/Time   CHOL 114 10/12/2019 1008   TRIG 103 10/12/2019 1008   HDL 30 (L) 10/12/2019 1008   CHOLHDL 3.8 10/12/2019 1008   CHOLHDL 3.9 02/06/2016 1058   VLDL 30 02/06/2016 1058   LDLCALC 65 10/12/2019 1008   LABVLDL 19 10/12/2019 1008     RADIOLOGY: No results found.   Additional studies/ records that were reviewed today include:  I obtained a new download of his CPAP use from April 28 through Jul 15, 2019.  ASSESSMENT:    1. Coronary artery disease involving native  coronary artery of native heart without angina pectoris: PTCA  distal RCA June 2016   2. Essential hypertension   3. OSA (obstructive sleep apnea)   4. Palpitations   5. Hyperlipidemia LDL goal <70   6. Morbid obesity due to excess calories [facility])     PLAN:   1. CAD: Mr. Gil suffered an acute coronary syndrome in June 2016 when he presented with 2 mm inferior ST elevation due to ulcerated plaque in his distal RCA and had reperfusion anticoagulation therapy.  PTCA alone was performed with excellent result.  Repeat catheterization in September 2016 revealed normal coronary arteries without restenosis.  He is to be angina free and has been maintained on long-term DAPT with aspirin/Plavix.  There is no bleeding or easy bruisability.  2.  Essential hypertension: When I last saw Mr. Alford Highland, despite further titration of valsartan at 320 mg daily and metoprolol succinate 50 mg, his blood pressure remained elevated.  He admitted to significant increased work-related stress.  Amlodipine 5 mg was added at that time.  He has been monitoring his blood pressure over the past several months.  These still have consistently tended to be elevated in the 1 35-1 50 range.  Typically his pulse is in the 70s but on 1 occasion while he was sitting it increased up to around 100.  I will further titrate his metoprolol succinate to 75 mg daily.  Target blood pressure is less than 130/80.  3.  OSA: He continues to be on CPAP with excellent compliance.  Obtained a new download today in the office which revealed 100% usage with average use of 7 hours and 33 minutes.  AHI is excellent at 0.3 at 13.4 cm of water.  He does have occasional mask leak.  He is sleeping well.  However there is still some residual daytime sleepiness with a increased Epworth Sleepiness Scale score endorsed in the office today at 13.   4.  DVT: Resolved, no longer on anticoagulation therapy.  5.  Palpitations: Resolved with metoprolol  succinate.  6.  Hyperlipidemia: Target LDL less than 70.  He continues to be on atorvastatin 40 mg daily.  Most recent laboratory October 12, 2019 menstruated an LDL at 52.  7.  Obesity: BMI today is 41.35 consistent with morbid obesity.  We discussed weight loss and increased activity.  Unfortunately he has been having bilateral knee discomfort since his slipping on bleachers and falling several weeks ago.  Morbidly obese with further weight gain.  BMI today is 41.35.   Patient has a follow-up office visit scheduled with his primary physician Dr. Donato Schultz in [place] in August in which time laboratory will be obtained.    As long as he remains stable I will see him in 6 months for reevaluation.   Medication Adjustments/Labs and Tests Ordered: Current medicines are reviewed at length with the patient today.  Concerns regarding medicines are outlined above.  Medication changes, Labs and Tests ordered today are listed in the Patient Instructions below. Patient Instructions  Medication Instructions:  INCREASE metoprolol succinate to 95m (1.5 tablets) daily.  *If you need a refill on your cardiac medications before your next appointment, please call your pharmacy*   Lab Work: None ordered.    Testing/Procedures: None ordered.    Follow-Up: At [facility] you and your health needs are our priority.  As part of our continuing mission to provide you with exceptional heart care, we have created designated Provider Care Teams.  These Care Teams include your primary Cardiologist (physician) and Advanced Practice Providers (APPs -  Physician Assistants and Nurse Practitioners) who all work together to provide you with the care you need, when you need it.  We recommend signing up for the patient portal called "MyChart".  Sign up information is provided on this After Visit Summary.  MyChart is used to connect with patients for Virtual Visits (Telemedicine).  Patients are able to view  lab/test results, encounter notes, upcoming appointments, etc.  Non-urgent messages can be sent to your provider as well.   To learn more about what you can do with MyChart, go to [email or web address]    Your next appointment:   6 month(s)  The format for your next appointment:   In Person  Provider:   TShelva Majestic MD      Signed, TShelva Majestic MD  09/11/2020 3:24 PM    [address] [place]  [id number]: [phone]

## 2020-09-11 NOTE — Patient Instructions (Signed)
Medication Instructions:  INCREASE metoprolol succinate to '75mg'$  (1.5 tablets) daily.  *If you need a refill on your cardiac medications before your next appointment, please call your pharmacy*   Lab Work: None ordered.    Testing/Procedures: None ordered.    Follow-Up: At [facility], you and your health needs are our priority.  As part of our continuing mission to provide you with exceptional heart care, we have created designated Provider Care Teams.  These Care Teams include your primary Cardiologist (physician) and Advanced Practice Providers (APPs -  Physician Assistants and Nurse Practitioners) who all work together to provide you with the care you need, when you need it.  We recommend signing up for the patient portal called "MyChart".  Sign up information is provided on this After Visit Summary.  MyChart is used to connect with patients for Virtual Visits (Telemedicine).  Patients are able to view lab/test results, encounter notes, upcoming appointments, etc.  Non-urgent messages can be sent to your provider as well.   To learn more about what you can do with MyChart, go to [email or web address].    Your next appointment:   6 month(s)  The format for your next appointment:   In Person  Provider:   Shelva Majestic, MD

## 2020-10-06 DIAGNOSIS — M1712 Unilateral primary osteoarthritis, left knee: Secondary | ICD-10-CM | POA: Diagnosis not present

## 2020-10-13 ENCOUNTER — Encounter: Payer: BC Managed Care – PPO | Admitting: Family Medicine

## 2020-10-17 ENCOUNTER — Other Ambulatory Visit: Payer: Self-pay

## 2020-10-17 ENCOUNTER — Telehealth (INDEPENDENT_AMBULATORY_CARE_PROVIDER_SITE_OTHER): Payer: BC Managed Care – PPO | Admitting: Family Medicine

## 2020-10-17 ENCOUNTER — Encounter: Payer: Self-pay | Admitting: Family Medicine

## 2020-10-17 DIAGNOSIS — J0140 Acute pansinusitis, unspecified: Secondary | ICD-10-CM | POA: Diagnosis not present

## 2020-10-17 MED ORDER — AMOXICILLIN-POT CLAVULANATE 875-125 MG PO TABS: 1.0000 | ORAL_TABLET | Freq: Two times a day (BID) | ORAL | 0 refills | Status: DC

## 2020-10-17 NOTE — Progress Notes (Signed)
Virtual telephone visit    Virtual Visit via Telephone Note   This visit type was conducted due to national recommendations for restrictions regarding the COVID-19 Pandemic (e.g. social distancing) in an effort to limit this patient's exposure and mitigate transmission in our community. Due to his co-morbid illnesses, this patient is at least at moderate risk for complications without adequate follow up. This format is felt to be most appropriate for this patient at this time. The patient did not have access to video technology or had technical difficulties with video requiring transitioning to audio format only (telephone). Physical exam was limited to content and character of the telephone converstion.    Patient location: Home Provider location: Office  I discussed the limitations of evaluation and management by telemedicine and the availability of in person appointments. The patient expressed understanding and agreed to proceed.   Visit Date: 10/17/2020  Today's healthcare provider: Vernie Murders, PA-C   Chief Complaint  Patient presents with   Sinus Problem   Subjective    Sinus Problem Episode onset: 3 weeks ago. Associated symptoms include congestion, ear pain, sneezing and a sore throat.   Sinus Problem This is a new problem. Episode onset: 3 weeks ago. There has been no fever. Associated symptoms include ear pain, a hoarse voice, sinus pressure, sneezing and a sore throat. Pertinent negatives include no chills, coughing or shortness of breath. Treatments tried: NyQuil.    Past Medical History:  Diagnosis Date   CAD (coronary artery disease)    a. 07/2014 Inf STEMI/PTCA:  LM nl, LAD nl, LCX nl, RCA ulcerated plaque (PTCA only);  b. 10/2014 MV: intermediate risk, ? inflat isch/scar;  c. 10/2014 Cath: Nl cors w/o RCA restenosis.   Diastolic dysfunction    a. 07/2014 Echo: EF 60-65%, Gr1 DD.   DVT of lower extremity (deep venous thrombosis) ([place])    a. ~ 2009, per pt was  on lovenox/coumadin   GERD (gastroesophageal reflux disease)    History of dysplastic nevus 12/07/2018   right post waistline/moderate, left low back/moderate, left mid back lateral near side/moderate    Hyperlipidemia    Hypertension    Hypertensive heart disease    TIA (transient ischemic attack) 07/23/2014   Past Surgical History:  Procedure Laterality Date   ARTHROPLASTY Right 07/09/2010   CARDIAC CATHETERIZATION N/A 07/23/2014   Procedure: Left Heart Cath;  Surgeon: Troy Sine, MD;  Location: [place] [facility];  Service: Cardiovascular;  Laterality: N/A;   CARDIAC CATHETERIZATION N/A 07/23/2014   Procedure: Coronary Balloon Angioplasty;  Surgeon: Troy Sine, MD;  Location: [place] [facility];  Service: Cardiovascular;  Laterality: N/A;   CARDIAC CATHETERIZATION N/A 11/10/2014   Procedure: Left Heart Cath and Coronary Angiography;  Surgeon: Troy Sine, MD;  Location: [place] [facility];  Service: Cardiovascular;  Laterality: N/A;   CERVICAL DISC SURGERY  2015   JOINT REPLACEMENT Right    Right knee   MENISCUS REPAIR Right 11/03/1995   Meniscus Lateral Tear: repaired by Dr. Sabra Heck at [facility]     Deviated Septum   REPLACEMENT TOTAL KNEE Right 2011   [facility]   Family History  Problem Relation Age of Onset   Hypertension Mother    Arthritis Mother    Diverticulitis Mother    Gout Father    Autoimmune disease Father    Arthritis Sister    Diabetes Paternal Grandfather    Diabetes Maternal Grandfather  Cancer Maternal Grandfather        unknown   Social History   Tobacco Use   Smoking status: Never   Smokeless tobacco: Former    Types: Nurse, children's Use: Never used  Substance Use Topics   Alcohol use: No    Alcohol/week: 1.0 standard drink    Types: 1 Standard drinks or equivalent per week    Comment: social drinker   Drug use: No   No Known Allergies  Medications: Outpatient Medications  Prior to Visit  Medication Sig   amLODipine (NORVASC) 5 MG tablet Take 1 tablet (5 mg total) by mouth daily.   aspirin EC 81 MG EC tablet Take 1 tablet (81 mg total) by mouth daily. (Patient taking differently: Take 81 mg by mouth at bedtime.)   atorvastatin (LIPITOR) 80 MG tablet TAKE 1 TABLET BY MOUTH EVERY DAY   clopidogrel (PLAVIX) 75 MG tablet TAKE 1 TABLET BY MOUTH EVERY DAY   metoprolol succinate (TOPROL-XL) 50 MG 24 hr tablet Take 1.5 tablets (75 mg total) by mouth daily. Take with or immediately following a meal.   nitroGLYCERIN (NITROSTAT) 0.4 MG SL tablet Place 1 tablet (0.4 mg total) under the tongue every 5 (five) minutes as needed for chest pain.   sertraline (ZOLOFT) 100 MG tablet Take 1.5 tablets (150 mg total) by mouth daily. PLEASE NOTE INCREASE TO 1 1/2 TABLETS ('150MG'$ ) DAILY   valsartan (DIOVAN) 320 MG tablet Take 1 tablet (320 mg total) by mouth daily.   No facility-administered medications prior to visit.    Review of Systems  Constitutional:  Negative for fever.  HENT:  Positive for congestion, ear pain, postnasal drip, sinus pain, sneezing and sore throat.   Eyes: Negative.   Respiratory: Negative.    Cardiovascular: Negative.   Genitourinary: Negative.    {Labs  Heme  Chem  Endocrine  Serology  Results Review (optional):[id number]}  Objective    There were no vitals taken for this visit.  Has not taken temperature. Slight hoarseness during telephonic interview. No wheezes or acute respiratory distress heard.   Assessment & Plan     1. Subacute pansinusitis Developed nasal congestion with description of an ethmoid headache over the past 3 weeks. No loss of taste, dyspnea or significant cough. Changed to a "bad sore throat", teeth hurting, left earache, PND and greenish bloody nasal discharge on 10-09-20. Using Flonase and Netti-Pot irrigation. History of COVID last year and 2 vaccinations. Has been isolating at home (works from home). Will treat with antibiotic,  Mucinex, OTC antihistamine, Flonase and Netti-Pot sinus irrigation. Increase fluid intake and should test for COVID if no better in 2-3 days. - amoxicillin-clavulanate (AUGMENTIN) 875-125 MG tablet; Take 1 tablet by mouth 2 (two) times daily.  Dispense: 20 tablet; Refill: 0   No follow-ups on file.    I discussed the assessment and treatment plan with the patient. The patient was provided an opportunity to ask questions and all were answered. The patient agreed with the plan and demonstrated an understanding of the instructions.   The patient was advised to call back or seek an in-person evaluation if the symptoms worsen or if the condition fails to improve as anticipated.  I provided 20 minutes of non-face-to-face time during this encounter.  I, Tevis Dunavan, PA-C, have reviewed all documentation for this visit. The documentation on 10/17/20 for the exam, diagnosis, procedures, and orders are all accurate and complete.   Vernie Murders, PA-C [facility]  [phone] (phone) [phone] (fax)  [place]

## 2020-10-25 ENCOUNTER — Encounter: Payer: Self-pay | Admitting: Family Medicine

## 2020-10-25 ENCOUNTER — Ambulatory Visit (INDEPENDENT_AMBULATORY_CARE_PROVIDER_SITE_OTHER): Payer: BC Managed Care – PPO | Admitting: Family Medicine

## 2020-10-25 ENCOUNTER — Other Ambulatory Visit: Payer: Self-pay

## 2020-10-25 VITALS — BP 136/80 | HR 67 | Temp 97.7°F | Resp 18 | Ht 70.0 in | Wt 298.0 lb

## 2020-10-25 DIAGNOSIS — I119 Hypertensive heart disease without heart failure: Secondary | ICD-10-CM | POA: Diagnosis not present

## 2020-10-25 DIAGNOSIS — E7800 Pure hypercholesterolemia, unspecified: Secondary | ICD-10-CM

## 2020-10-25 DIAGNOSIS — Z860100 Personal history of colon polyps, unspecified: Secondary | ICD-10-CM

## 2020-10-25 DIAGNOSIS — R5383 Other fatigue: Secondary | ICD-10-CM

## 2020-10-25 DIAGNOSIS — Z23 Encounter for immunization: Secondary | ICD-10-CM

## 2020-10-25 DIAGNOSIS — F419 Anxiety disorder, unspecified: Secondary | ICD-10-CM

## 2020-10-25 DIAGNOSIS — Z9989 Dependence on other enabling machines and devices: Secondary | ICD-10-CM

## 2020-10-25 DIAGNOSIS — Z1211 Encounter for screening for malignant neoplasm of colon: Secondary | ICD-10-CM

## 2020-10-25 DIAGNOSIS — Z0000 Encounter for general adult medical examination without abnormal findings: Secondary | ICD-10-CM

## 2020-10-25 DIAGNOSIS — Z125 Encounter for screening for malignant neoplasm of prostate: Secondary | ICD-10-CM

## 2020-10-25 DIAGNOSIS — G4733 Obstructive sleep apnea (adult) (pediatric): Secondary | ICD-10-CM

## 2020-10-25 DIAGNOSIS — F32A Depression, unspecified: Secondary | ICD-10-CM

## 2020-10-25 DIAGNOSIS — R1031 Right lower quadrant pain: Secondary | ICD-10-CM

## 2020-10-25 MED ORDER — SERTRALINE HCL 100 MG PO TABS: 200.0000 mg | ORAL_TABLET | Freq: Every day | ORAL | Status: DC

## 2020-10-25 NOTE — Progress Notes (Addendum)
Complete physical exam   Patient: Eduardo Poole   DOB: Jul 18, 1970   50 y.o. Male  MRN: [id number] Visit Date: 10/25/2020  Today's healthcare provider: Lelon Huh, MD   Chief Complaint  Patient presents with   Annual Exam   Hypertension   Hyperlipidemia   Sleep Apnea   Depression   Subjective    TRASEAN Poole is a 50 y.o. male who presents today for a complete physical exam.  He reports consuming a general diet. The patient does not participate in regular exercise at present. He generally feels fairly well. He reports sleeping fairly well. He does have additional problems to discuss today (right groin pain x 3 months) That occasionally bulges out.  HPI  Hypertension, follow-up  BP Readings from Last 3 Encounters:  10/25/20 136/80  09/11/20 132/84  07/03/20 (!) 154/92   Wt Readings from Last 3 Encounters:  10/25/20 298 lb (135.2 kg)  09/11/20 288 lb 3.2 oz (130.7 kg)  07/03/20 297 lb (134.7 kg)     He was last seen for hypertension 1  year  ago.  BP at that visit was 130/77. Management since that visit includes continuing annual cardiology follow up.  He reports good compliance with treatment. He is not having side effects.  He is following a Regular diet. He is not exercising. He does not smoke.  Use of agents associated with hypertension: NSAIDS.   Outside blood pressures are 150/100. Symptoms: No chest pain No chest pressure  No palpitations No syncope  No dyspnea No orthopnea  No paroxysmal nocturnal dyspnea No lower extremity edema   Pertinent labs: Lab Results  Component Value Date   CHOL 114 10/12/2019   HDL 30 (L) 10/12/2019   LDLCALC 65 10/12/2019   TRIG 103 10/12/2019   CHOLHDL 3.8 10/12/2019   Lab Results  Component Value Date   NA 140 10/12/2019   K 4.6 10/12/2019   CREATININE 1.01 10/12/2019   GFRNONAA 87 10/12/2019   GFRAA 100 10/12/2019   GLUCOSE 100 (H) 10/12/2019     The ASCVD Risk score Mikey Bussing DC Jr., et al., 2013)  failed to calculate for the following reasons:   The valid total cholesterol range is 130 to 320 mg/dL   ---------------------------------------------------------------------------------------------------   Lipid/Cholesterol, Follow-up  Last lipid panel Other pertinent labs  Lab Results  Component Value Date   CHOL 114 10/12/2019   HDL 30 (L) 10/12/2019   LDLCALC 65 10/12/2019   TRIG 103 10/12/2019   CHOLHDL 3.8 10/12/2019   Lab Results  Component Value Date   ALT 38 10/12/2019   AST 27 10/12/2019   PLT 296 10/12/2019   TSH 2.620 10/12/2019     He was last seen for this 1  year  ago.  Management since that visit includes continuing same medication.  He reports good compliance with treatment. He is not having side effects.   Symptoms: No chest pain No chest pressure/discomfort  No dyspnea No lower extremity edema  No numbness or tingling of extremity No orthopnea  No palpitations No paroxysmal nocturnal dyspnea  No speech difficulty No syncope   Current diet: well balanced Current exercise: none  The ASCVD Risk score ([place] et al., 2013) failed to calculate for the following reasons:   The valid total cholesterol range is 130 to 320 mg/dL  ---------------------------------------------------------------------------------------------------   Follow up for OSA:  The patient was last seen for this 1  year  ago. Changes made at last  visit include none; continue using CPAP.  He reports good compliance with treatment. He feels that condition is Unchanged. He is not having side effects. Is using CPAP every night and feels it continues to work well, feeling much better rested when he uses it.   -----------------------------------------------------------------------------------------   Depression, Follow-up  He  was last seen for this 1  year  ago. Changes made at last visit include none; continue same dose of sertraline.   He reports good compliance with  treatment. He is not having side effects.   He reports good tolerance of treatment. Current symptoms include: depressed mood, exacerbated by stress.  He feels he is Unchanged since last visit.  Depression screen [facility] 2/9 10/25/2020 10/12/2019 10/05/2018  Decreased Interest '3 2 3  '$ Down, Depressed, Hopeless '3 2 2  '$ PHQ - 2 Score '6 4 5  '$ Altered sleeping 3 0 3  Tired, decreased energy '3 3 3  '$ Change in appetite '3 3 3  '$ Feeling bad or failure about yourself  '2 2 1  '$ Trouble concentrating '3 3 3  '$ Moving slowly or fidgety/restless 3 0 3  Suicidal thoughts 2 0 0  PHQ-9 Score '25 15 21  '$ Difficult doing work/chores Extremely dIfficult Extremely dIfficult Somewhat difficult     He also reports several months of intermittent discomfort in right inguinal area made worse by activity, especially heavy lifting. Occasionally develops a bulge just above scrotum on right that has been getting larger and recently up to the sized of a golf ball. Is now limiting activity to discomfort associated with activity.  -----------------------------------------------------------------------------------------   Past Medical History:  Diagnosis Date   CAD (coronary artery disease)    a. 07/2014 Inf STEMI/PTCA:  LM nl, LAD nl, LCX nl, RCA ulcerated plaque (PTCA only);  b. 10/2014 MV: intermediate risk, ? inflat isch/scar;  c. 10/2014 Cath: Nl cors w/o RCA restenosis.   Diastolic dysfunction    a. 07/2014 Echo: EF 60-65%, Gr1 DD.   DVT of lower extremity (deep venous thrombosis) ([place])    a. ~ 2009, per pt was on lovenox/coumadin   GERD (gastroesophageal reflux disease)    History of dysplastic nevus 12/07/2018   right post waistline/moderate, left low back/moderate, left mid back lateral near side/moderate    Hyperlipidemia    Hypertension    Hypertensive heart disease    ST elevation myocardial infarction (STEMI) involving right coronary artery with complication ([place]) 0000000   TIA (transient ischemic attack) 07/23/2014    Past Surgical History:  Procedure Laterality Date   ARTHROPLASTY Right 07/09/2010   CARDIAC CATHETERIZATION N/A 07/23/2014   Procedure: Left Heart Cath;  Surgeon: Troy Sine, MD;  Location: [place] [facility];  Service: Cardiovascular;  Laterality: N/A;   CARDIAC CATHETERIZATION N/A 07/23/2014   Procedure: Coronary Balloon Angioplasty;  Surgeon: Troy Sine, MD;  Location: [place] [facility];  Service: Cardiovascular;  Laterality: N/A;   CARDIAC CATHETERIZATION N/A 11/10/2014   Procedure: Left Heart Cath and Coronary Angiography;  Surgeon: Troy Sine, MD;  Location: [place] [facility];  Service: Cardiovascular;  Laterality: N/A;   CERVICAL DISC SURGERY  2015   JOINT REPLACEMENT Right    Right knee   MENISCUS REPAIR Right 11/03/1995   Meniscus Lateral Tear: repaired by Dr. Sabra Heck at [facility]   REPLACEMENT TOTAL KNEE Right 2011   [facility]   Social History   Socioeconomic History   Marital  status: Married    Spouse name: Not on file   Number of children: 2   Years of education: 12   Highest education level: Not on file  Occupational History   Occupation: Employed    Comment: Works at [employer] Use   Smoking status: Never   Smokeless tobacco: Former    Types: Nurse, children's Use: Never used  Substance and Sexual Activity   Alcohol use: No    Alcohol/week: 1.0 standard drink    Types: 1 Standard drinks or equivalent per week    Comment: social drinker   Drug use: No   Sexual activity: Yes  Other Topics Concern   Not on file  Social History Narrative   Not on file   Social Determinants of Health   Financial Resource Strain: Not on file  Food Insecurity: Not on file  Transportation Needs: Not on file  Physical Activity: Not on file  Stress: Not on file  Social Connections: Not on file  Intimate Partner Violence: Not on file   Family Status  Relation Name Status    Mother  Alive   Father  Alive   Sister  Alive   PGF  (Not Specified)   MGF  Deceased   Family History  Problem Relation Age of Onset   Hypertension Mother    Arthritis Mother    Diverticulitis Mother    Gout Father    Autoimmune disease Father    Arthritis Sister    Diabetes Paternal Grandfather    Diabetes Maternal Grandfather    Cancer Maternal Grandfather        unknown   No Known Allergies  Patient Care Team: Birdie Sons, MD as PCP - General (Family Medicine) Troy Sine, MD as PCP - Cardiology (Cardiology) Burnis Kingfisher, MD as Referring Physician (Orthopedic Surgery) Beverly Gust, MD (Unknown Physician Specialty) Troy Sine, MD as Consulting Physician (Cardiology)   Medications: Outpatient Medications Prior to Visit  Medication Sig   amLODipine (NORVASC) 5 MG tablet Take 1 tablet (5 mg total) by mouth daily.   amoxicillin-clavulanate (AUGMENTIN) 875-125 MG tablet Take 1 tablet by mouth 2 (two) times daily.   aspirin EC 81 MG EC tablet Take 1 tablet (81 mg total) by mouth daily. (Patient taking differently: Take 81 mg by mouth at bedtime.)   atorvastatin (LIPITOR) 80 MG tablet TAKE 1 TABLET BY MOUTH EVERY DAY   clopidogrel (PLAVIX) 75 MG tablet TAKE 1 TABLET BY MOUTH EVERY DAY   metoprolol succinate (TOPROL-XL) 50 MG 24 hr tablet Take 1.5 tablets (75 mg total) by mouth daily. Take with or immediately following a meal.   nitroGLYCERIN (NITROSTAT) 0.4 MG SL tablet Place 1 tablet (0.4 mg total) under the tongue every 5 (five) minutes as needed for chest pain.   sertraline (ZOLOFT) 100 MG tablet Take 1.5 tablets (150 mg total) by mouth daily. PLEASE NOTE INCREASE TO 1 1/2 TABLETS ('150MG'$ ) DAILY   valsartan (DIOVAN) 320 MG tablet Take 1 tablet (320 mg total) by mouth daily.   No facility-administered medications prior to visit.    Review of Systems  Constitutional:  Positive for diaphoresis, fatigue and unexpected weight change. Negative for appetite  change, chills and fever.  HENT:  Positive for nosebleeds and tinnitus. Negative for congestion, ear pain, hearing loss and trouble swallowing.   Eyes:  Positive for itching. Negative for pain and visual disturbance.  Respiratory:  Negative for cough, chest tightness and shortness  of breath.   Cardiovascular:  Negative for chest pain, palpitations and leg swelling.  Gastrointestinal:  Positive for nausea. Negative for abdominal pain, blood in stool, constipation, diarrhea and vomiting.  Endocrine: Positive for heat intolerance and polyphagia. Negative for polydipsia and polyuria.  Genitourinary:  Positive for testicular pain. Negative for dysuria and flank pain.  Musculoskeletal:  Positive for arthralgias, back pain, joint swelling, neck pain and neck stiffness. Negative for myalgias.  Skin:  Negative for color change, rash and wound.  Neurological:  Positive for speech difficulty, light-headedness and numbness. Negative for dizziness, tremors, seizures, weakness and headaches.  Psychiatric/Behavioral:  Positive for agitation, behavioral problems, confusion, decreased concentration and dysphoric mood. Negative for sleep disturbance. The patient is nervous/anxious.   All other systems reviewed and are negative.    Objective    BP 136/80 (BP Location: Left Arm, Patient Position: Sitting, Cuff Size: Large)   Pulse 67   Temp 97.7 F (36.5 C) (Temporal)   Resp 18   Ht '5\' 10"'$  (1.778 m)   Wt 298 lb (135.2 kg)   BMI 42.76 kg/m    Physical Exam   General Appearance:    Obese male. Alert, cooperative, in no acute distress, appears stated age  Head:    Normocephalic, without obvious abnormality, atraumatic  Eyes:    PERRL, conjunctiva/corneas clear, EOM's intact, fundi    benign, both eyes       Ears:    Normal TM's and external ear canals, both ears  Neck:   Supple, symmetrical, trachea midline, no adenopathy;       thyroid:  No enlargement/tenderness/nodules; no carotid   bruit or JVD   Back:     Symmetric, no curvature, ROM normal, no CVA tenderness  Lungs:     Clear to auscultation bilaterally, respirations unlabored  Chest wall:    No tenderness or deformity  Heart:    Normal heart rate. Normal rhythm. No murmurs, rubs, or gallops.  S1 and S2 normal  Abdomen:     Soft, non-tender, bowel sounds active all four quadrants,    no masses, no organomegaly  Genitalia:    deferred  Rectal:    deferred  Extremities:   All extremities are intact. No cyanosis or edema  Pulses:   2+ and symmetric all extremities  Skin:   Skin color, texture, turgor normal, no rashes or lesions  Lymph nodes:   Cervical, supraclavicular, and axillary nodes normal  Neurologic:   CNII-XII intact. Normal strength, sensation and reflexes      throughout    (Addenda 11/03/20): Right inguinal defect just above scrotum about 1cm across with slight bulging on valsalva.   Last depression screening scores PHQ 2/9 Scores 10/25/2020 10/12/2019 10/05/2018  PHQ - 2 Score '6 4 5  '$ PHQ- 9 Score '25 15 21   '$ Last fall risk screening Fall Risk  10/25/2020  Falls in the past year? 1  Number falls in past yr: 1  Injury with Fall? 0  Risk for fall due to : History of fall(s)  Follow up Falls prevention discussed;Falls evaluation completed   Last Audit-C alcohol use screening Alcohol Use Disorder Test (AUDIT) 10/05/2018  1. How often do you have a drink containing alcohol? 1  2. How many drinks containing alcohol do you have on a typical day when you are drinking? 1  3. How often do you have six or more drinks on one occasion? 1  AUDIT-C Score 3   A score of 3 or more in  women, and 4 or more in men indicates increased risk for alcohol abuse, EXCEPT if all of the points are from question 1   No results found for any visits on 10/25/20.  Assessment & Plan    Routine Health Maintenance and Physical Exam  Exercise Activities and Dietary recommendations  Goals   None     Immunization History  Administered  Date(s) Administered   Influenza,inj,Quad PF,6+ Mos 12/21/2012, 12/02/2014, 04/23/2019, 10/12/2019   Influenza-Unspecified 12/19/2017   Td 06/19/2010   Tdap 10/12/2019    Health Maintenance  Topic Date Due   COVID-19 Vaccine (1) Never done   Pneumococcal Vaccine 44-6 Years old (1 - PCV) Never done   HIV Screening  Never done   Zoster Vaccines- Shingrix (1 of 2) Never done   COLONOSCOPY (Pts 45-35yr Insurance coverage will need to be confirmed)  04/08/2019   INFLUENZA VACCINE  09/18/2020   TETANUS/TDAP  10/11/2029   Hepatitis C Screening  Completed   HPV VACCINES  Aged Out    Discussed health benefits of physical activity, and encouraged him to engage in regular exercise appropriate for his age and condition. .diagm  1. Annual physical exam   2. Prostate cancer screening  - PSA Total (Reflex To Free) ([facility] only)  3. Morbid obesity due to excess calories (HCC)  - TSH  4. Hypertensive heart disease without heart failure  - TSH - CBC with Differential/Platelet  5. Pure hypercholesterolemia He is tolerating atorvastatin well with no adverse effects.   - Comprehensive metabolic panel - Lipid panel  6. Anxiety and depression Increase from 1 1/2 daily to  sertraline (ZOLOFT) 100 MG tablet; Take 2 tablets (200 mg total) by mouth daily.  Discussed adding second medication such as buspirone to help more with anxiety. He will see how increase dose of sertraline works first.   7. Colon cancer screening  - Ambulatory referral to gastroenterology for colonoscopy  8. Need for influenza vaccination  - Flu Vaccine QUAD 36+ mos IM (Fluarix/Fluzone)  9. . OSA on CPAP Compliant with CPAP and medically benefiting from its use.   11. Right groin pain  Associated with sensation of swelling behind testicle. Is limiting physical activity.  Likely inguinal hernia. Refer generally surgery       The entirety of the information documented in the History of Present Illness,  Review of Systems and Physical Exam were personally obtained by me. Portions of this information were initially documented by the CMA and reviewed by me for thoroughness and accuracy.     DLelon Huh MD  [phone](phone) [phone](fax)  [place]

## 2020-10-25 NOTE — Progress Notes (Signed)
Encounter created in error

## 2020-10-25 NOTE — Patient Instructions (Addendum)
Please review the attached list of medications and notify my office if there are any errors.   Increase sertraline to 2 tablets daily. If not doing better in 2-3 weeks then let me know and we can try adding low dose of buspirone.  .Please contact your eyecare professional to schedule a routine eye exam. I recommend seeing Dr. Jomarie Longs at [phone] or the [facility] at [phone]   Please go to the lab draw station in [address] on the second floor of [facility]  when you are fasting for 8 hours. Normal hours are 8:00am to 11:30am and 1:00pm to 4:00pm Monday through Friday

## 2020-10-26 ENCOUNTER — Other Ambulatory Visit (INDEPENDENT_AMBULATORY_CARE_PROVIDER_SITE_OTHER): Payer: Self-pay

## 2020-10-26 DIAGNOSIS — Z86010 Personal history of colonic polyps: Secondary | ICD-10-CM

## 2020-10-26 MED ORDER — PEG 3350-KCL-NA BICARB-NACL 420 G PO SOLR: 4000.0000 mL | Freq: Once | ORAL | 0 refills | Status: AC

## 2020-10-26 NOTE — Progress Notes (Signed)
Gastroenterology Pre-Procedure Review  Request Date: 11/09/20 Requesting Physician: Dr. Bonna Gains  PATIENT REVIEW QUESTIONS: The patient responded to the following health history questions as indicated:    1. Are you having any GI issues? no 2. Do you have a personal history of Polyps? yes (04/07/2014) 3. Do you have a family history of Colon Cancer or Polyps? no 4. Diabetes Mellitus? no 5. Joint replacements in the past 12 months?no 6. Major health problems in the past 3 months?no 7. Any artificial heart valves, MVP, or defibrillator?no    MEDICATIONS & ALLERGIES:    Patient reports the following regarding taking any anticoagulation/antiplatelet therapy:   Plavix, Coumadin, Eliquis, Xarelto, Lovenox, Pradaxa, Brilinta, or Effient? yes (Plavix 75 mg) Aspirin? yes (81 mg)  Patient confirms/reports the following medications:  Current Outpatient Medications  Medication Sig Dispense Refill   amLODipine (NORVASC) 5 MG tablet Take 1 tablet (5 mg total) by mouth daily. 90 tablet 3   amoxicillin-clavulanate (AUGMENTIN) 875-125 MG tablet Take 1 tablet by mouth 2 (two) times daily. 20 tablet 0   aspirin EC 81 MG EC tablet Take 1 tablet (81 mg total) by mouth daily. (Patient taking differently: Take 81 mg by mouth at bedtime.)     atorvastatin (LIPITOR) 80 MG tablet TAKE 1 TABLET BY MOUTH EVERY DAY 90 tablet 0   clopidogrel (PLAVIX) 75 MG tablet TAKE 1 TABLET BY MOUTH EVERY DAY 90 tablet 2   metoprolol succinate (TOPROL-XL) 50 MG 24 hr tablet Take 1.5 tablets (75 mg total) by mouth daily. Take with or immediately following a meal. 135 tablet 3   nitroGLYCERIN (NITROSTAT) 0.4 MG SL tablet Place 1 tablet (0.4 mg total) under the tongue every 5 (five) minutes as needed for chest pain. 25 tablet 12   sertraline (ZOLOFT) 100 MG tablet Take 2 tablets (200 mg total) by mouth daily.     valsartan (DIOVAN) 320 MG tablet Take 1 tablet (320 mg total) by mouth daily. 90 tablet 3   No current  facility-administered medications for this visit.    Patient confirms/reports the following allergies:  No Known Allergies  No orders of the defined types were placed in this encounter.   AUTHORIZATION INFORMATION Primary Insurance: 1D#: Group #:  Secondary Insurance: 1D#: Group #:  SCHEDULE INFORMATION: Date: 11/09/20 Time: Location: [place]

## 2020-10-27 DIAGNOSIS — E7800 Pure hypercholesterolemia, unspecified: Secondary | ICD-10-CM | POA: Diagnosis not present

## 2020-10-27 DIAGNOSIS — M542 Cervicalgia: Secondary | ICD-10-CM | POA: Diagnosis not present

## 2020-10-27 DIAGNOSIS — Z125 Encounter for screening for malignant neoplasm of prostate: Secondary | ICD-10-CM | POA: Diagnosis not present

## 2020-10-27 DIAGNOSIS — I119 Hypertensive heart disease without heart failure: Secondary | ICD-10-CM | POA: Diagnosis not present

## 2020-10-28 LAB — TSH: TSH: 3.14 u[IU]/mL (ref 0.450–4.500)

## 2020-10-28 LAB — COMPREHENSIVE METABOLIC PANEL
ALT: 37 IU/L (ref 0–44)
AST: 25 IU/L (ref 0–40)
Albumin/Globulin Ratio: 1.7 (ref 1.2–2.2)
Albumin: 4.5 g/dL (ref 4.0–5.0)
Alkaline Phosphatase: 85 IU/L (ref 44–121)
BUN/Creatinine Ratio: 16 (ref 9–20)
BUN: 15 mg/dL (ref 6–24)
Bilirubin Total: 0.3 mg/dL (ref 0.0–1.2)
CO2: 23 mmol/L (ref 20–29)
Calcium: 9.6 mg/dL (ref 8.7–10.2)
Chloride: 99 mmol/L (ref 96–106)
Creatinine, Ser: 0.94 mg/dL (ref 0.76–1.27)
Globulin, Total: 2.6 g/dL (ref 1.5–4.5)
Glucose: 103 mg/dL — ABNORMAL HIGH (ref 65–99)
Potassium: 4.5 mmol/L (ref 3.5–5.2)
Sodium: 138 mmol/L (ref 134–144)
Total Protein: 7.1 g/dL (ref 6.0–8.5)
eGFR: 99 mL/min/{1.73_m2} (ref >59)

## 2020-10-28 LAB — CBC WITH DIFFERENTIAL/PLATELET
Basophils Absolute: 0.1 10*3/uL (ref 0.0–0.2)
Basos: 1 %
EOS (ABSOLUTE): 0.3 10*3/uL (ref 0.0–0.4)
Eos: 4 %
Hematocrit: 45.5 % (ref 37.5–51.0)
Hemoglobin: 15.1 g/dL (ref 13.0–17.7)
Immature Grans (Abs): 0 10*3/uL (ref 0.0–0.1)
Immature Granulocytes: 0 %
Lymphocytes Absolute: 2 10*3/uL (ref 0.7–3.1)
Lymphs: 29 %
MCH: 29.6 pg (ref 26.6–33.0)
MCHC: 33.2 g/dL (ref 31.5–35.7)
MCV: 89 fL (ref 79–97)
Monocytes Absolute: 0.7 10*3/uL (ref 0.1–0.9)
Monocytes: 10 %
Neutrophils Absolute: 4 10*3/uL (ref 1.4–7.0)
Neutrophils: 56 %
Platelets: 278 10*3/uL (ref 150–450)
RBC: 5.1 x10E6/uL (ref 4.14–5.80)
RDW: 13.1 % (ref 11.6–15.4)
WBC: 7 10*3/uL (ref 3.4–10.8)

## 2020-10-28 LAB — LIPID PANEL
Chol/HDL Ratio: 4.2 ratio (ref 0.0–5.0)
Cholesterol, Total: 127 mg/dL (ref 100–199)
HDL: 30 mg/dL — ABNORMAL LOW (ref >39)
LDL Chol Calc (NIH): 75 mg/dL (ref 0–99)
Triglycerides: 122 mg/dL (ref 0–149)
VLDL Cholesterol Cal: 22 mg/dL (ref 5–40)

## 2020-10-28 LAB — PSA TOTAL (REFLEX TO FREE): Prostate Specific Ag, Serum: 1.6 ng/mL (ref 0.0–4.0)

## 2020-10-31 DIAGNOSIS — M5412 Radiculopathy, cervical region: Secondary | ICD-10-CM | POA: Diagnosis not present

## 2020-10-31 DIAGNOSIS — M542 Cervicalgia: Secondary | ICD-10-CM | POA: Diagnosis not present

## 2020-11-01 ENCOUNTER — Telehealth: Payer: Self-pay

## 2020-11-01 NOTE — Telephone Encounter (Signed)
Pt advised.  He says he is going to come by Friday (11/03/2020) at [facility].  Do I need to put it on the schedule?  Thanks,   -Mickel Baas

## 2020-11-01 NOTE — Telephone Encounter (Signed)
-----   Message from Birdie Sons, MD sent at 10/28/2020  7:14 AM EDT ----- Labs are good. Continue current medications.  Check yearly.  He was complaining of groin pain when he was here for his CPE, but I forgot to check him for a hernia. If he's still having it he can stop by some morning this week (except Thursday) to see if that's whats going on.

## 2020-11-02 NOTE — Telephone Encounter (Signed)
You can put him on the schedule so we know he's coming, but won't count it as visit.

## 2020-11-03 ENCOUNTER — Other Ambulatory Visit: Payer: Self-pay

## 2020-11-03 ENCOUNTER — Ambulatory Visit (INDEPENDENT_AMBULATORY_CARE_PROVIDER_SITE_OTHER): Payer: BC Managed Care – PPO | Admitting: Family Medicine

## 2020-11-03 DIAGNOSIS — M1712 Unilateral primary osteoarthritis, left knee: Secondary | ICD-10-CM | POA: Diagnosis not present

## 2020-11-03 DIAGNOSIS — M25562 Pain in left knee: Secondary | ICD-10-CM | POA: Diagnosis not present

## 2020-11-03 DIAGNOSIS — K4090 Unilateral inguinal hernia, without obstruction or gangrene, not specified as recurrent: Secondary | ICD-10-CM

## 2020-11-03 NOTE — Progress Notes (Signed)
Please see [id number] office note regarding inguinal hernia.

## 2020-11-06 ENCOUNTER — Telehealth: Payer: Self-pay

## 2020-11-06 NOTE — Telephone Encounter (Signed)
Pt lmovm requesting information about blood thinner request... pt stated that he reached out to the Cardiologist and they stated they did not have a request that they see... Please advise as pt has upcoming procedure 11/09/2020

## 2020-11-07 DIAGNOSIS — M5412 Radiculopathy, cervical region: Secondary | ICD-10-CM | POA: Diagnosis not present

## 2020-11-07 DIAGNOSIS — M25562 Pain in left knee: Secondary | ICD-10-CM | POA: Diagnosis not present

## 2020-11-07 DIAGNOSIS — M542 Cervicalgia: Secondary | ICD-10-CM | POA: Diagnosis not present

## 2020-11-07 NOTE — Telephone Encounter (Signed)
Spoke with patient and he agreed to reschedule procedure until blood thinner clearance comes back. Procedure has been moved to 11/23/20.

## 2020-11-13 ENCOUNTER — Telehealth: Payer: Self-pay

## 2020-11-13 NOTE — Telephone Encounter (Signed)
   [facility] Pre-operative Risk Assessment    Patient Name: Eduardo Poole  DOB: 09-08-1970 MRN: [id number]   Request for surgical clearance:  What type of surgery is being performed? COLONOSCOPY  When is this surgery scheduled? 11-23-2020  What type of clearance is required (medical clearance vs. Pharmacy clearance to hold med vs. Both)? PHARMACY  Are there any medications that need to be held prior to surgery and how long? PLAVIX  Practice name and name of physician performing surgery? [facility]  What is the office phone number? [phone]   7.   What is the office fax number? [phone]  8.   Anesthesia type (None, local, MAC, general) ? NOT LISTED

## 2020-11-13 NOTE — Telephone Encounter (Signed)
   Name: Eduardo Poole  DOB: 1970-09-30  MRN: [id number]   Primary Cardiologist: Shelva Majestic, MD  Chart reviewed as part of pre-operative protocol coverage. Patient was contacted 11/13/2020 in reference to pre-operative risk assessment for pending surgery as outlined below.  Eduardo Poole was last seen 08/2020 by Dr. Claiborne Billings, history reviewed. I reached out to patient for update on how he is doing. The patient affirms he has been doing well without any new cardiac symptoms. Therefore, based on ACC/AHA guidelines, the patient would be at acceptable risk for the planned procedure without further cardiovascular testing. The patient was advised that if he develops new symptoms prior to surgery to contact our office to arrange for a follow-up visit, and he verbalized understanding.  In a previous note 09/2019, Dr. Claiborne Billings had granted clearance to hold Plavix 5 days prior to colonoscopy therefore we can apply the same clearance here since he has not had any interim cardiac procedures which would preclude holding.  I will route this recommendation to the requesting party via Epic fax function and remove from pre-op pool. Please call with questions.  Charlie Pitter, PA-C 11/13/2020, 1:44 PM

## 2020-11-14 ENCOUNTER — Telehealth: Payer: Self-pay

## 2020-11-14 NOTE — Telephone Encounter (Signed)
Informed patient via voicemail. He is to hold his Plavix 5 days prior to procedure per Dr. Claiborne Billings. LVM with this information. See clearance message on 11/13/20.

## 2020-11-16 ENCOUNTER — Ambulatory Visit (INDEPENDENT_AMBULATORY_CARE_PROVIDER_SITE_OTHER): Payer: BC Managed Care – PPO | Admitting: General Surgery

## 2020-11-16 ENCOUNTER — Encounter: Payer: Self-pay | Admitting: General Surgery

## 2020-11-16 ENCOUNTER — Other Ambulatory Visit: Payer: Self-pay

## 2020-11-16 ENCOUNTER — Other Ambulatory Visit: Payer: Self-pay | Admitting: General Surgery

## 2020-11-16 VITALS — BP 162/97 | HR 69 | Temp 98.2°F | Ht 71.0 in | Wt 298.4 lb

## 2020-11-16 DIAGNOSIS — K4020 Bilateral inguinal hernia, without obstruction or gangrene, not specified as recurrent: Secondary | ICD-10-CM

## 2020-11-16 DIAGNOSIS — N5082 Scrotal pain: Secondary | ICD-10-CM

## 2020-11-16 NOTE — Patient Instructions (Addendum)
Your Ultrasound is scheduled for Tuesday November 21, 2020. Arrive at [facility] in [place]. [address], [place], [place] [address]    If you have any concerns or questions, please feel free to call our office.    Inguinal Hernia, Adult  An inguinal hernia is when fat or your intestines push through a weak spot in a muscle where your leg meets your lower belly (groin). This causes a bulge. This kind of hernia could also be: In your scrotum, if you are male. In folds of skin around your vagina, if you are male. There are three types of inguinal hernias: Hernias that can be pushed back into the belly (are reducible). This type rarely causes pain. Hernias that cannot be pushed back into the belly (are incarcerated). Hernias that cannot be pushed back into the belly and lose their blood supply (are strangulated). This type needs emergency surgery. What are the causes? This condition is caused by having a weak spot in the muscles or tissues in your groin. This develops over time. The hernia may poke through the weak spot when you strain your lower belly muscles all of a sudden, such as when you: Lift a heavy object. Strain to poop (have a bowel movement). Trouble pooping (constipation) can lead to straining. Cough. What increases the risk? This condition is more likely to develop in: Males. Pregnant females. People who: Are overweight. Work in jobs that require long periods of standing or heavy lifting. Have had an inguinal hernia before. Smoke or have lung disease. These factors can lead to long-term (chronic) coughing. What are the signs or symptoms? Symptoms may depend on the size of the hernia. Often, a small hernia has no symptoms. Symptoms of a larger hernia may include: A bulge in the groin area. This is easier to see when standing. You might not be able to see it when you are lying down. Pain or burning in the groin. This may get worse when you lift, strain, or cough. A  dull ache or a feeling of pressure in the groin. An abnormal bulge in the scrotum, in males. Symptoms of a strangulated inguinal hernia may include: A bulge in your groin that is very painful and tender to the touch. A bulge that turns red or purple. Fever, feeling like you may vomit (nausea), and vomiting. Not being able to poop or to pass gas. How is this treated? Treatment depends on the size of your hernia and whether you have symptoms. If you do not have symptoms, your doctor may have you watch your hernia carefully and have you come in for follow-up visits. If your hernia is large or if you have symptoms, you may need surgery to repair the hernia. Follow these instructions at home: Lifestyle Avoid lifting heavy objects. Avoid standing for long amounts of time. Do not smoke or use any products that contain nicotine or tobacco. If you need help quitting, ask your doctor. Stay at a healthy weight. Prevent trouble pooping You may need to take these actions to prevent or treat trouble pooping: Drink enough fluid to keep your pee (urine) pale yellow. Take over-the-counter or prescription medicines. Eat foods that are high in fiber. These include beans, whole grains, and fresh fruits and vegetables. Limit foods that are high in fat and sugar. These include fried or sweet foods. General instructions You may try to push your hernia back in place by very gently pressing on it when you are lying down. Do not try  to push the bulge back in if it will not go in easily. Watch your hernia for any changes in shape, size, or color. Tell your doctor if you see any changes. Take over-the-counter and prescription medicines only as told by your doctor. Keep all follow-up visits. Contact a doctor if: You have a fever or chills. You have new symptoms. Your symptoms get worse. Get help right away if: You have pain in your groin that gets worse all of a sudden. You have a bulge in your groin that: Gets  bigger all of a sudden, and it does not get smaller after that. Turns red or purple. Is painful when you touch it. You are a male, and you have: Sudden pain in your scrotum. A sudden change in the size of your scrotum. You cannot push the hernia back in place by very gently pressing on it when you are lying down. You feel like you may vomit, and that feeling does not go away. You keep vomiting. You have a fast heartbeat. You cannot poop or pass gas. These symptoms may be an emergency. Get help right away. Call your local emergency services (911 in the [place] Do not wait to see if the symptoms will go away. Do not drive yourself to the hospital. Summary An inguinal hernia is when fat or your intestines push through a weak spot in a muscle where your leg meets your lower belly (groin). This causes a bulge. If you do not have symptoms, you may not need treatment. If you have symptoms or a large hernia, you may need surgery. Avoid lifting heavy objects. Also, avoid standing for long amounts of time. Do not try to push the bulge back in if it will not go in easily. This information is not intended to replace advice given to you by your health care provider. Make sure you discuss any questions you have with your health care provider. Document Revised: 10/05/2019 Document Reviewed: 10/05/2019 Elsevier Patient Education  2022 [employer].

## 2020-11-20 ENCOUNTER — Other Ambulatory Visit: Payer: Self-pay | Admitting: Family Medicine

## 2020-11-20 ENCOUNTER — Telehealth: Payer: Self-pay | Admitting: Family Medicine

## 2020-11-20 DIAGNOSIS — F419 Anxiety disorder, unspecified: Secondary | ICD-10-CM

## 2020-11-20 MED ORDER — SERTRALINE HCL 100 MG PO TABS: 200.0000 mg | ORAL_TABLET | Freq: Every day | ORAL | 3 refills | Status: DC

## 2020-11-20 NOTE — Progress Notes (Signed)
50 y/o M with right groin discomfort. Hernia suspeced, but no bulge appreciated on exam. Question laxity of ring on left. Will get bilateral ultrasound.  F/u with another provider as I am no longer with [facility].

## 2020-11-20 NOTE — Telephone Encounter (Signed)
Please advise on refill request. Last office visit on 10/25/2020. Sertraline was increased from 100mg  1.5 tablets daily to 100mg  two tablets daily. Patient requesting refill with new instructions. Please advise.

## 2020-11-20 NOTE — Telephone Encounter (Signed)
Pt is calling because the pharmacy is stating that they never received the sertraline (ZOLOFT) 100 MG tablet [[id number]] or  That Increase from 1 1/2 daily to  sertraline (ZOLOFT) 100 MG tablet; Take 2 tablets (200 mg total) by mouth daily. [phone] Preferred Pharmacy-[facility] [place], [place]

## 2020-11-21 ENCOUNTER — Ambulatory Visit
Admission: RE | Admit: 2020-11-21 | Discharge: 2020-11-21 | Disposition: A | Discharge status: Home / Self Care | Payer: BC Managed Care – PPO | Source: Ambulatory Visit | Attending: General Surgery | Admitting: General Surgery

## 2020-11-21 ENCOUNTER — Other Ambulatory Visit: Payer: Self-pay

## 2020-11-21 DIAGNOSIS — M1712 Unilateral primary osteoarthritis, left knee: Secondary | ICD-10-CM | POA: Diagnosis not present

## 2020-11-21 DIAGNOSIS — K4020 Bilateral inguinal hernia, without obstruction or gangrene, not specified as recurrent: Secondary | ICD-10-CM | POA: Insufficient documentation

## 2020-11-21 DIAGNOSIS — Z0389 Encounter for observation for other suspected diseases and conditions ruled out: Secondary | ICD-10-CM | POA: Diagnosis not present

## 2020-11-23 ENCOUNTER — Encounter
Admission: RE | Disposition: A | Discharge status: Home / Self Care | Payer: Self-pay | Source: Home / Self Care | Attending: Gastroenterology

## 2020-11-23 ENCOUNTER — Ambulatory Visit
Admission: RE | Admit: 2020-11-23 | Discharge: 2020-11-23 | Disposition: A | Discharge status: Home / Self Care | Payer: BC Managed Care – PPO | Attending: Gastroenterology | Admitting: Gastroenterology

## 2020-11-23 ENCOUNTER — Ambulatory Visit: Payer: BC Managed Care – PPO | Admitting: Registered Nurse

## 2020-11-23 ENCOUNTER — Encounter: Payer: Self-pay | Admitting: Gastroenterology

## 2020-11-23 ENCOUNTER — Other Ambulatory Visit: Payer: Self-pay

## 2020-11-23 DIAGNOSIS — K5730 Diverticulosis of large intestine without perforation or abscess without bleeding: Secondary | ICD-10-CM | POA: Insufficient documentation

## 2020-11-23 DIAGNOSIS — Z860101 Personal history of adenomatous and serrated colon polyps: Secondary | ICD-10-CM

## 2020-11-23 DIAGNOSIS — Z7982 Long term (current) use of aspirin: Secondary | ICD-10-CM | POA: Diagnosis not present

## 2020-11-23 DIAGNOSIS — D122 Benign neoplasm of ascending colon: Secondary | ICD-10-CM | POA: Insufficient documentation

## 2020-11-23 DIAGNOSIS — E785 Hyperlipidemia, unspecified: Secondary | ICD-10-CM | POA: Insufficient documentation

## 2020-11-23 DIAGNOSIS — I119 Hypertensive heart disease without heart failure: Secondary | ICD-10-CM | POA: Diagnosis not present

## 2020-11-23 DIAGNOSIS — Z860100 Personal history of colon polyps, unspecified: Secondary | ICD-10-CM

## 2020-11-23 DIAGNOSIS — G4730 Sleep apnea, unspecified: Secondary | ICD-10-CM | POA: Diagnosis not present

## 2020-11-23 DIAGNOSIS — K219 Gastro-esophageal reflux disease without esophagitis: Secondary | ICD-10-CM | POA: Diagnosis not present

## 2020-11-23 DIAGNOSIS — Z791 Long term (current) use of non-steroidal anti-inflammatories (NSAID): Secondary | ICD-10-CM | POA: Insufficient documentation

## 2020-11-23 DIAGNOSIS — D120 Benign neoplasm of cecum: Secondary | ICD-10-CM | POA: Diagnosis not present

## 2020-11-23 DIAGNOSIS — K635 Polyp of colon: Secondary | ICD-10-CM

## 2020-11-23 DIAGNOSIS — D123 Benign neoplasm of transverse colon: Secondary | ICD-10-CM | POA: Insufficient documentation

## 2020-11-23 DIAGNOSIS — Z86718 Personal history of other venous thrombosis and embolism: Secondary | ICD-10-CM | POA: Diagnosis not present

## 2020-11-23 DIAGNOSIS — I252 Old myocardial infarction: Secondary | ICD-10-CM | POA: Insufficient documentation

## 2020-11-23 DIAGNOSIS — Z1211 Encounter for screening for malignant neoplasm of colon: Secondary | ICD-10-CM | POA: Diagnosis not present

## 2020-11-23 DIAGNOSIS — D126 Benign neoplasm of colon, unspecified: Secondary | ICD-10-CM | POA: Diagnosis not present

## 2020-11-23 DIAGNOSIS — I2510 Atherosclerotic heart disease of native coronary artery without angina pectoris: Secondary | ICD-10-CM | POA: Insufficient documentation

## 2020-11-23 DIAGNOSIS — Z7902 Long term (current) use of antithrombotics/antiplatelets: Secondary | ICD-10-CM | POA: Insufficient documentation

## 2020-11-23 DIAGNOSIS — Z6841 Body Mass Index (BMI) 40.0 and over, adult: Secondary | ICD-10-CM | POA: Diagnosis not present

## 2020-11-23 HISTORY — PX: COLONOSCOPY WITH PROPOFOL: SHX5780

## 2020-11-23 SURGERY — COLONOSCOPY WITH PROPOFOL
Anesthesia: General

## 2020-11-23 MED ORDER — PROPOFOL 500 MG/50ML IV EMUL
INTRAVENOUS | Status: DC | PRN
  Administered 2020-11-23: 140 ug/kg/min via INTRAVENOUS

## 2020-11-23 MED ORDER — PROPOFOL 10 MG/ML IV BOLUS
INTRAVENOUS | Status: DC | PRN
  Administered 2020-11-23: 70 mg via INTRAVENOUS

## 2020-11-23 MED ORDER — SODIUM CHLORIDE 0.9 % IV SOLN: INTRAVENOUS | Status: DC

## 2020-11-23 MED ORDER — LIDOCAINE HCL (CARDIAC) PF 100 MG/5ML IV SOSY
PREFILLED_SYRINGE | INTRAVENOUS | Status: DC | PRN
  Administered 2020-11-23: 60 mg via INTRAVENOUS

## 2020-11-23 NOTE — Anesthesia Preprocedure Evaluation (Addendum)
Anesthesia Evaluation  Patient identified by MRN, date of birth, ID band Patient awake    Reviewed: Allergy & Precautions, NPO status , Patient's Chart, lab work & pertinent test results, reviewed documented beta blocker date and time   Airway Mallampati: III  TM Distance: >3 FB Neck ROM: Full    Dental no notable dental hx.    Pulmonary sleep apnea and Continuous Positive Airway Pressure Ventilation ,    Pulmonary exam normal        Cardiovascular Exercise Tolerance: Good hypertension, Pt. on medications and Pt. on home beta blockers + CAD, + Past MI (remote 2016), + Cardiac Stents (2016), +CHF (DD) and + DOE  Normal cardiovascular exam     Neuro/Psych PSYCHIATRIC DISORDERS Anxiety TIA (2016)   GI/Hepatic negative GI ROS, Neg liver ROS,   Endo/Other  Morbid obesity  Renal/GU negative Renal ROS     Musculoskeletal  (+) Arthritis ,   Abdominal (+) + obese,   Peds  Hematology negative hematology ROS (+)   Anesthesia Other Findings   Reproductive/Obstetrics                            Anesthesia Physical Anesthesia Plan  ASA: 3  Anesthesia Plan: General   Post-op Pain Management:    Induction: Intravenous  PONV Risk Score and Plan: 2 and Propofol infusion, TIVA and Treatment may vary due to age or medical condition  Airway Management Planned: Natural Airway and Simple Face Mask  Additional Equipment:   Intra-op Plan:   Post-operative Plan:   Informed Consent: I have reviewed the patients History and Physical, chart, labs and discussed the procedure including the risks, benefits and alternatives for the proposed anesthesia with the patient or authorized representative who has indicated his/her understanding and acceptance.     Dental advisory given  Plan Discussed with: Anesthesiologist  Anesthesia Plan Comments:        Anesthesia Quick Evaluation

## 2020-11-23 NOTE — H&P (Signed)
Vonda Antigua, MD [address] [place], [place], [place], [address] [address], [place], [place], [place], [address] Phone: [phone]  Fax: [phone]  Primary Care Physician:  Birdie Sons, MD   Pre-Procedure History & Physical: HPI:  Eduardo Poole is a 50 y.o. male is here for a colonoscopy.   Past Medical History:  Diagnosis Date   CAD (coronary artery disease)    a. 07/2014 Inf STEMI/PTCA:  LM nl, LAD nl, LCX nl, RCA ulcerated plaque (PTCA only);  b. 10/2014 MV: intermediate risk, ? inflat isch/scar;  c. 10/2014 Cath: Nl cors w/o RCA restenosis.   Diastolic dysfunction    a. 07/2014 Echo: EF 60-65%, Gr1 DD.   DVT of lower extremity (deep venous thrombosis) ([place])    a. ~ 2009, per pt was on lovenox/coumadin   GERD (gastroesophageal reflux disease)    History of dysplastic nevus 12/07/2018   right post waistline/moderate, left low back/moderate, left mid back lateral near side/moderate    Hyperlipidemia    Hypertension    Hypertensive heart disease    ST elevation myocardial infarction (STEMI) involving right coronary artery with complication ([place]) 06/20/8411   TIA (transient ischemic attack) 07/23/2014    Past Surgical History:  Procedure Laterality Date   ARTHROPLASTY Right 07/09/2010   CARDIAC CATHETERIZATION N/A 07/23/2014   Procedure: Left Heart Cath;  Surgeon: Troy Sine, MD;  Location: [facility];  Service: Cardiovascular;  Laterality: N/A;   CARDIAC CATHETERIZATION N/A 07/23/2014   Procedure: Coronary Balloon Angioplasty;  Surgeon: Troy Sine, MD;  Location: [facility];  Service: Cardiovascular;  Laterality: N/A;   CARDIAC CATHETERIZATION N/A 11/10/2014   Procedure: Left Heart Cath and Coronary Angiography;  Surgeon: Troy Sine, MD;  Location: [facility];  Service: Cardiovascular;  Laterality: N/A;   CERVICAL DISC SURGERY  2015   COLONOSCOPY     COLONOSCOPY     JOINT REPLACEMENT Right    Right knee   MENISCUS  REPAIR Right 11/03/1995   Meniscus Lateral Tear: repaired by Dr. Sabra Heck at [facility]   REPLACEMENT TOTAL KNEE Right 2011   [facility]    Prior to Admission medications   Medication Sig Start Date End Date Taking? Authorizing Provider  amLODipine (NORVASC) 5 MG tablet Take 1 tablet (5 mg total) by mouth daily. 07/03/20   Troy Sine, MD  aspirin EC 81 MG EC tablet Take 1 tablet (81 mg total) by mouth daily. Patient taking differently: Take 81 mg by mouth at bedtime. 07/26/14   Brett Canales, PA-C  atorvastatin (LIPITOR) 80 MG tablet TAKE 1 TABLET BY MOUTH EVERY DAY 08/28/20   Troy Sine, MD  clopidogrel (PLAVIX) 75 MG tablet TAKE 1 TABLET BY MOUTH EVERY DAY 06/19/20   Troy Sine, MD  gabapentin (NEURONTIN) 300 MG capsule gabapentin 300 mg capsule  TAKE 1 CAPSULE BY MOUTH TWICE A DAY    [provider]  meloxicam (MOBIC) 7.5 MG tablet meloxicam 7.5 mg tablet  TAKE 1 TABLET BY MOUTH EVERY DAY    [provider]  metoprolol succinate (TOPROL-XL) 50 MG 24 hr tablet Take 1.5 tablets (75 mg total) by mouth daily. Take with or immediately following a meal. 09/11/20   Troy Sine, MD  nitroGLYCERIN (NITROSTAT) 0.4 MG SL tablet Place 1 tablet (0.4 mg total) under the tongue every 5 (five) minutes as needed  for chest pain. 07/26/14   Brett Canales, PA-C  sertraline (ZOLOFT) 100 MG tablet Take 2 tablets (200 mg total) by mouth daily. 11/20/20   Gwyneth Sprout, FNP  tiZANidine (ZANAFLEX) 4 MG tablet tizanidine 4 mg tablet  TAKE 1 TABLET BY MOUTH EVERY 6 HOURS AS NEEDED    [provider]  valsartan (DIOVAN) 320 MG tablet Take 1 tablet (320 mg total) by mouth daily. 09/11/20   Troy Sine, MD    Allergies as of 10/26/2020   (No Known Allergies)    Family History  Problem Relation Age of Onset   Hypertension Mother    Arthritis Mother    Diverticulitis Mother    Gout Father    Autoimmune  disease Father    Arthritis Sister    Diabetes Paternal Grandfather    Diabetes Maternal Grandfather    Cancer Maternal Grandfather        unknown    Social History   Socioeconomic History   Marital status: Married    Spouse name: Not on file   Number of children: 2   Years of education: 12   Highest education level: Not on file  Occupational History   Occupation: Employed    Comment: Works at [employer] Use   Smoking status: Never   Smokeless tobacco: Former    Types: Nurse, children's Use: Never used  Substance and Sexual Activity   Alcohol use: Yes    Alcohol/week: 3.0 standard drinks    Types: 2 Cans of beer, 1 Standard drinks or equivalent per week    Comment: none last 24 hrs   Drug use: No   Sexual activity: Yes  Other Topics Concern   Not on file  Social History Narrative   Not on file   Social Determinants of Health   Financial Resource Strain: Not on file  Food Insecurity: Not on file  Transportation Needs: Not on file  Physical Activity: Not on file  Stress: Not on file  Social Connections: Not on file  Intimate Partner Violence: Not on file    Review of Systems: See HPI, otherwise negative ROS  Physical Exam: Constitutional: General:   Alert,  Well-developed, well-nourished, pleasant and cooperative in NAD BP (!) 177/102   Pulse 71   Temp (!) 96.5 F (35.8 C) (Temporal)   Resp 18   Ht 5\' 11"  (1.803 m)   Wt 132.5 kg   SpO2 98%   BMI 40.73 kg/m   Head: Normocephalic, atraumatic.   Eyes:  Sclera clear, no icterus.   Conjunctiva pink.   Mouth:  No deformity or lesions, oropharynx pink & moist.  Neck:  Supple, trachea midline  Respiratory: Normal respiratory effort  Gastrointestinal:  Soft, non-tender and non-distended without masses, hepatosplenomegaly or hernias noted.  No guarding or rebound tenderness.     Cardiac: No clubbing or edema.  No cyanosis. Normal posterior tibial pedal pulses noted.  Lymphatic:   No significant cervical adenopathy.  Psych:  Alert and cooperative. Normal mood and affect.  Musculoskeletal:   Symmetrical without gross deformities. 5/5 Lower extremity strength bilaterally.  Skin: Warm. Intact without significant lesions or rashes. No jaundice.  Neurologic:  Face symmetrical, tongue midline, Normal sensation to touch;  grossly normal neurologically.  Psych:  Alert and oriented x3, Alert and cooperative. Normal mood and affect.  Impression/Plan: Eduardo Poole is here for a colonoscopy to be performed for history of tubular adenoma polyps in 2016  Risks, benefits, limitations, and alternatives regarding  colonoscopy have been reviewed with the patient.  Questions have been answered.  All parties agreeable.   Virgel Manifold, MD  11/23/2020, 8:10 AM

## 2020-11-23 NOTE — Op Note (Signed)
[facility] Gastroenterology Patient Name: Abdifatah Colquhoun Procedure Date: 11/23/2020 8:09 AM MRN: [id number] Account #: [id number] Date of Birth: 1970/06/21 Admit Type: Outpatient Age: 50 Room: [facility] ENDO ROOM 2 Gender: Male Note Status: Finalized Instrument Name: Jasper Riling [id number] Procedure:             Colonoscopy Indications:           High risk colon cancer surveillance: Personal history                         of colonic polyps Providers:             Gracelyn Coventry B. Bonna Gains MD, MD Referring MD:          Kirstie Peri. Caryn Section, MD (Referring MD) Medicines:             Monitored Anesthesia Care Complications:         No immediate complications. Procedure:             Pre-Anesthesia Assessment:                        - ASA Grade Assessment: II - A patient with mild                         systemic disease.                        - Prior to the procedure, a History and Physical was                         performed, and patient medications, allergies and                         sensitivities were reviewed. The patient's tolerance                         of previous anesthesia was reviewed.                        - The risks and benefits of the procedure and the                         sedation options and risks were discussed with the                         patient. All questions were answered and informed                         consent was obtained.                        - Patient identification and proposed procedure were                         verified prior to the procedure by the physician, the                         nurse, the anesthesiologist, the anesthetist and the  technician. The procedure was verified in the                         procedure room.                        After obtaining informed consent, the colonoscope was                         passed under direct vision. Throughout the procedure,                         the  patient's blood pressure, pulse, and oxygen                         saturations were monitored continuously. The                         Colonoscope was introduced through the anus and                         advanced to the the cecum, identified by appendiceal                         orifice and ileocecal valve. The colonoscopy was                         performed with ease. The patient tolerated the                         procedure well. The quality of the bowel preparation                         was good. Findings:      The perianal and digital rectal examinations were normal.      A 5 mm polyp was found in the cecum. The polyp was sessile. The polyp       was removed with a cold snare. Resection and retrieval were complete.       Please note that the cecal polyp was placed in Jar B. The cecal polyp       was removed in one piece and suctioned into the channel. The trap was       found to have two pieces of polyp tissue once the cecal polyp was       suctioned. The second piece of tissue in Jar B may represent transverse       colon polyp tissue from Jar A (Transverse colon polyp). One of the two       polyps placed in Jar A , was removed during scope insertion as opposed       to during scope withdrawal. This polyp was suctioned into the channel       and placed in Jar A and was noted to only be a small piece, and that       small piece may not have represented the entire polyp tissue.      Two sessile polyps were found in the transverse colon. The polyps were 5       to 6 mm in size. These polyps were removed with a cold snare. Resection  and retrieval were complete. One of the two polyps placed in Jar A , was       removed during scope insertion as opposed to during scope withdrawal.       This polyp was suctioned into the channel and placed in Jar A and was       noted to only be a small piece in the trap. That small piece may not       have represented the entire polyp  tissue that was removed. The rest of       the polyp could have been stuck in the suction channel, as two pieces of       polyp tissue were seen in the trap after removal of the cecal polyp       placed in Jar B.      Two sessile polyps were found in the ascending colon. The polyps were 5       to 6 mm in size. These polyps were removed with a cold snare. Resection       and retrieval were complete. One of the two polyps removed from the       ascending colon was on the Ileocecal valve.      Multiple diverticula were found in the sigmoid colon.      The exam was otherwise without abnormality.      The rectum, sigmoid colon, descending colon, transverse colon, ascending       colon and cecum appeared normal.      The retroflexed view of the distal rectum and anal verge was normal and       showed no anal or rectal abnormalities. Impression:            - One 5 mm polyp in the cecum, removed with a cold                         snare. Resected and retrieved.                        - Two 5 to 6 mm polyps in the transverse colon,                         removed with a cold snare. Resected and retrieved.                        - Two 5 to 6 mm polyps in the ascending colon, removed                         with a cold snare. Resected and retrieved.                        - Diverticulosis in the sigmoid colon.                        - The examination was otherwise normal.                        - The rectum, sigmoid colon, descending colon,                         transverse colon, ascending colon and cecum are normal.                        -  The distal rectum and anal verge are normal on                         retroflexion view. Recommendation:        - Discharge patient to home (with escort).                        - Advance diet as tolerated.                        - Continue present medications.                        - Await pathology results.                        - Repeat colonoscopy  date to be determined after                         pending pathology results are reviewed.                        - The findings and recommendations were discussed with                         the patient.                        - The findings and recommendations were discussed with                         the patient's family.                        - Return to primary care physician as previously                         scheduled.                        - High fiber diet. Procedure Code(s):     --- Professional ---                        [phone], Colonoscopy, flexible; with removal of                         tumor(s), polyp(s), or other lesion(s) by snare                         technique Diagnosis Code(s):     --- Professional ---                        K63.5, Polyp of colon                        Z86.010, Personal history of colonic polyps CPT copyright 2019 American [employer]. All rights reserved. The codes documented in this report are preliminary and upon coder review may  be revised to meet current compliance requirements.  Vonda Antigua, MD Margretta Sidle B. Bonna Gains MD, MD 11/23/2020 9:02:31 AM This report has been signed electronically. Number of  Addenda: 0 Note Initiated On: 11/23/2020 8:09 AM Scope Withdrawal Time: 0 hours 18 minutes 45 seconds  Total Procedure Duration: 0 hours 23 minutes 46 seconds  Estimated Blood Loss:  Estimated blood loss: none.      [facility]

## 2020-11-23 NOTE — Anesthesia Postprocedure Evaluation (Signed)
Anesthesia Post Note  Patient: Eduardo Poole  Procedure(s) Performed: COLONOSCOPY WITH PROPOFOL  Patient location during evaluation: Endoscopy Anesthesia Type: General Level of consciousness: awake and alert Pain management: pain level controlled Vital Signs Assessment: post-procedure vital signs reviewed and stable Respiratory status: spontaneous breathing, nonlabored ventilation and respiratory function stable Cardiovascular status: blood pressure returned to baseline and stable Postop Assessment: no apparent nausea or vomiting Anesthetic complications: no   No notable events documented.   Last Vitals:  Vitals:   11/23/20 0904 11/23/20 0914  BP:    Pulse:    Resp: 20 (!) 21  Temp:    SpO2:      Last Pain:  Vitals:   11/23/20 0914  TempSrc:   PainSc: 0-No pain                 [place] Ouch

## 2020-11-23 NOTE — Transfer of Care (Signed)
Immediate Anesthesia Transfer of Care Note  Patient: Eduardo Poole  Procedure(s) Performed: COLONOSCOPY WITH PROPOFOL  Patient Location: PACU  Anesthesia Type:General  Level of Consciousness: sedated  Airway & Oxygen Therapy: Patient Spontanous Breathing  Post-op Assessment: Report given to RN and Post -op Vital signs reviewed and stable  Post vital signs: Reviewed and stable  Last Vitals:  Vitals Value Taken Time  BP 133/66 11/23/20 0845  Temp 36.1 C 11/23/20 0844  Pulse 77 11/23/20 0846  Resp 21 11/23/20 0845  SpO2 95 % 11/23/20 0846  Vitals shown include unvalidated device data.  Last Pain:  Vitals:   11/23/20 0844  TempSrc: Temporal  PainSc: Asleep         Complications: No notable events documented.

## 2020-11-24 ENCOUNTER — Encounter: Payer: Self-pay | Admitting: Gastroenterology

## 2020-11-24 LAB — SURGICAL PATHOLOGY

## 2020-11-27 ENCOUNTER — Telehealth: Payer: Self-pay

## 2020-11-27 ENCOUNTER — Encounter: Payer: Self-pay | Admitting: Gastroenterology

## 2020-11-27 NOTE — Telephone Encounter (Signed)
Copied from [place] [id number]. Topic: General - Other >> Nov 27, 2020 11:05 AM Tessa Lerner A wrote: Reason for CRM: The patient saw a general surgeon to discuss the hernia, as previously discussed with their PCP, and was told by the General Surgeon that they were unable to feel/locate the hernia   The patient shares that they received an ultrasound on 11/20/20 to locate a potential hernia on the right side of their groin   The patient shares that they have not received the results of the ultrasound and was also told by the technician performing the imaging procedure that they were unable to locate it as well  The patient would like to further discuss their next steps with their PCP  The patient declined to schedule an additional appt at the time of call

## 2020-11-28 NOTE — Telephone Encounter (Signed)
Patient is follow up regarding the message below and would like to know what PCP recommends. Best # [phone]

## 2020-11-29 ENCOUNTER — Other Ambulatory Visit: Payer: Self-pay | Admitting: Cardiovascular Disease

## 2020-11-29 NOTE — Telephone Encounter (Addendum)
Theres not really anything else to do at this point. Eventually it will stay swollen all the time and can send back to surgeon at that time. Mar[place] Kitchen

## 2020-11-30 NOTE — Telephone Encounter (Signed)
Pt advised.   Thanks,   -Alonie Gazzola  

## 2020-12-05 DIAGNOSIS — M542 Cervicalgia: Secondary | ICD-10-CM | POA: Diagnosis not present

## 2020-12-05 DIAGNOSIS — M5412 Radiculopathy, cervical region: Secondary | ICD-10-CM | POA: Diagnosis not present

## 2020-12-08 DIAGNOSIS — M5412 Radiculopathy, cervical region: Secondary | ICD-10-CM | POA: Diagnosis not present

## 2020-12-08 DIAGNOSIS — M542 Cervicalgia: Secondary | ICD-10-CM | POA: Diagnosis not present

## 2020-12-21 ENCOUNTER — Other Ambulatory Visit: Payer: Self-pay

## 2020-12-21 ENCOUNTER — Encounter: Payer: Self-pay | Admitting: Urology

## 2020-12-21 ENCOUNTER — Ambulatory Visit (INDEPENDENT_AMBULATORY_CARE_PROVIDER_SITE_OTHER): Payer: BC Managed Care – PPO | Admitting: Urology

## 2020-12-21 VITALS — BP 197/103 | HR 83 | Ht 71.0 in | Wt 292.0 lb

## 2020-12-21 DIAGNOSIS — R3129 Other microscopic hematuria: Secondary | ICD-10-CM

## 2020-12-21 DIAGNOSIS — N5082 Scrotal pain: Secondary | ICD-10-CM | POA: Diagnosis not present

## 2020-12-21 LAB — URINALYSIS, COMPLETE
Bilirubin, UA: NEGATIVE
Glucose, UA: NEGATIVE
Ketones, UA: NEGATIVE
Leukocytes,UA: NEGATIVE
Nitrite, UA: NEGATIVE
Protein,UA: NEGATIVE
Specific Gravity, UA: 1.01 (ref 1.005–1.030)
Urobilinogen, Ur: 0.2 mg/dL (ref 0.2–1.0)
pH, UA: 5.5 (ref 5.0–7.5)

## 2020-12-21 LAB — MICROSCOPIC EXAMINATION
Bacteria, UA: NONE SEEN
WBC, UA: NONE SEEN /hpf (ref 0–5)

## 2020-12-21 NOTE — Progress Notes (Signed)
12/21/2020 9:49 AM   Eduardo Poole 1970-04-22 [id number]  Referring provider: Birdie Sons, MD [address] [place],   [address]  Chief Complaint  Patient presents with   Testicle Pain    HPI: Eduardo Poole is a 50 y.o. male self-referred for evaluation of right hemiscrotal pain.  ~ 1 year history intermittent right hemiscrotal pain Pain worse with movement, increased activity and after intercourse Severity typically mild to moderate however at times will be more severe rated at 8/10 No bothersome LUTS Denies dysuria, gross hematuria No prior history urologic problems He was referred to general surgery by his primary provider for possible hernia which was not appreciated on exam.  Bilateral groin ultrasound was performed which showed no definite hernia   PMH: Past Medical History:  Diagnosis Date   CAD (coronary artery disease)    a. 07/2014 Inf STEMI/PTCA:  LM nl, LAD nl, LCX nl, RCA ulcerated plaque (PTCA only);  b. 10/2014 MV: intermediate risk, ? inflat isch/scar;  c. 10/2014 Cath: Nl cors w/o RCA restenosis.   Diastolic dysfunction    a. 07/2014 Echo: EF 60-65%, Gr1 DD.   DVT of lower extremity (deep venous thrombosis) ([place])    a. ~ 2009, per pt was on lovenox/coumadin   GERD (gastroesophageal reflux disease)    History of dysplastic nevus 12/07/2018   right post waistline/moderate, left low back/moderate, left mid back lateral near side/moderate    Hyperlipidemia    Hypertension    Hypertensive heart disease    ST elevation myocardial infarction (STEMI) involving right coronary artery with complication ([place]) 3/0/8657   TIA (transient ischemic attack) 07/23/2014    Surgical History: Past Surgical History:  Procedure Laterality Date   ARTHROPLASTY Right 07/09/2010   CARDIAC CATHETERIZATION N/A 07/23/2014   Procedure: Left Heart Cath;  Surgeon: Troy Sine, MD;  Location: [place] [facility];  Service: Cardiovascular;  Laterality: N/A;    CARDIAC CATHETERIZATION N/A 07/23/2014   Procedure: Coronary Balloon Angioplasty;  Surgeon: Troy Sine, MD;  Location: [place] [facility];  Service: Cardiovascular;  Laterality: N/A;   CARDIAC CATHETERIZATION N/A 11/10/2014   Procedure: Left Heart Cath and Coronary Angiography;  Surgeon: Troy Sine, MD;  Location: [place] [facility];  Service: Cardiovascular;  Laterality: N/A;   CERVICAL DISC SURGERY  2015   COLONOSCOPY     COLONOSCOPY     COLONOSCOPY WITH PROPOFOL N/A 11/23/2020   Procedure: COLONOSCOPY WITH PROPOFOL;  Surgeon: Virgel Manifold, MD;  Location: [facility] ENDOSCOPY;  Service: Endoscopy;  Laterality: N/A;   JOINT REPLACEMENT Right    Right knee   MENISCUS REPAIR Right 11/03/1995   Meniscus Lateral Tear: repaired by Dr. Sabra Heck at [facility]   REPLACEMENT TOTAL KNEE Right 2011   [facility] Medications:  Allergies as of 12/21/2020   No Known Allergies      Medication List        Accurate as of December 21, 2020  9:49 AM. If you have any questions, ask your nurse or doctor.          amLODipine 5 MG tablet Commonly known as: NORVASC Take 1 tablet (5 mg total) by mouth daily.   aspirin 81 MG EC tablet Take 1 tablet (81 mg total) by mouth daily. What changed: when to take this   atorvastatin 80 MG tablet Commonly known as: LIPITOR TAKE 1  TABLET BY MOUTH EVERY DAY   clopidogrel 75 MG tablet Commonly known as: PLAVIX TAKE 1 TABLET BY MOUTH EVERY DAY   gabapentin 300 MG capsule Commonly known as: NEURONTIN gabapentin 300 mg capsule  TAKE 1 CAPSULE BY MOUTH TWICE A DAY   meloxicam 7.5 MG tablet Commonly known as: MOBIC meloxicam 7.5 mg tablet  TAKE 1 TABLET BY MOUTH EVERY DAY   metoprolol succinate 50 MG 24 hr tablet Commonly known as: TOPROL-XL Take 1.5 tablets (75 mg total) by mouth daily. Take with or immediately following a meal.   nitroGLYCERIN 0.4 MG SL  tablet Commonly known as: NITROSTAT Place 1 tablet (0.4 mg total) under the tongue every 5 (five) minutes as needed for chest pain.   sertraline 100 MG tablet Commonly known as: ZOLOFT Take 2 tablets (200 mg total) by mouth daily.   tiZANidine 4 MG tablet Commonly known as: ZANAFLEX tizanidine 4 mg tablet  TAKE 1 TABLET BY MOUTH EVERY 6 HOURS AS NEEDED   valsartan 320 MG tablet Commonly known as: DIOVAN Take 1 tablet (320 mg total) by mouth daily.        Allergies: No Known Allergies  Family History: Family History  Problem Relation Age of Onset   Hypertension Mother    Arthritis Mother    Diverticulitis Mother    Gout Father    Autoimmune disease Father    Arthritis Sister    Diabetes Paternal Grandfather    Diabetes Maternal Grandfather    Cancer Maternal Grandfather        unknown    Social History:  reports that he has never smoked. He has quit using smokeless tobacco.  His smokeless tobacco use included chew. He reports current alcohol use of about 3.0 standard drinks per week. He reports that he does not use drugs.   Physical Exam: BP (!) 197/103   Pulse 83   Ht 5\' 11"  (1.803 m)   Wt 292 lb (132.5 kg)   BMI 40.73 kg/m   Constitutional:  Alert and oriented, No acute distress. HEENT: [place] AT, moist mucus membranes.  Trachea midline, no masses. Cardiovascular: No clubbing, cyanosis, or edema. Respiratory: Normal respiratory effort, no increased work of breathing. GI: Abdomen is soft, nontender, nondistended, no abdominal masses GU: Phallus without lesions, testes descended bilateral without masses or tenderness.  Spermatic cords thick bilaterally without tenderness.  No hernia appreciated. Skin: No rashes, bruises or suspicious lesions. Neurologic: Grossly intact, no focal deficits, moving all 4 extremities. Psychiatric: Normal mood and affect.  Laboratory Data:  Urinalysis 12/21/2020: Dipstick 2+ blood/microscopy 3-10 RBC; 0 WBC   Assessment & Plan:     1.  Chronic right scrotal content pain No abnormalities noted on exam We discussed the possibility of referred pain UA today with microhematuria and will schedule CT urogram for evaluation of his microhematuria and possible sources of referred right hemiscrotal pain  2.  Microhematuria As above We discussed the recommendation of cystoscopy however will await CT results prior to scheduling cystoscopy   Abbie Sons, MD  [facility] [address] [place], [place] [address] [phone]

## 2021-01-04 ENCOUNTER — Ambulatory Visit (HOSPITAL_COMMUNITY): Payer: BC Managed Care – PPO

## 2021-01-15 ENCOUNTER — Ambulatory Visit (HOSPITAL_COMMUNITY)
Admission: RE | Admit: 2021-01-15 | Discharge: 2021-01-15 | Disposition: A | Discharge status: Home / Self Care | Payer: BC Managed Care – PPO | Source: Ambulatory Visit | Attending: Urology | Admitting: Urology

## 2021-01-15 ENCOUNTER — Other Ambulatory Visit: Payer: Self-pay

## 2021-01-15 DIAGNOSIS — K4090 Unilateral inguinal hernia, without obstruction or gangrene, not specified as recurrent: Secondary | ICD-10-CM | POA: Diagnosis not present

## 2021-01-15 DIAGNOSIS — K5730 Diverticulosis of large intestine without perforation or abscess without bleeding: Secondary | ICD-10-CM | POA: Diagnosis not present

## 2021-01-15 DIAGNOSIS — R3129 Other microscopic hematuria: Secondary | ICD-10-CM | POA: Diagnosis not present

## 2021-01-15 DIAGNOSIS — N5082 Scrotal pain: Secondary | ICD-10-CM | POA: Diagnosis not present

## 2021-01-15 LAB — POCT I-STAT CREATININE: Creatinine, Ser: 1 mg/dL (ref 0.61–1.24)

## 2021-01-15 MED ORDER — SODIUM CHLORIDE 0.9 % IV SOLN
INTRAVENOUS | Status: AC
  Filled 2021-01-15: qty 250

## 2021-01-15 MED ORDER — IOHEXOL 350 MG/ML SOLN
100.0000 mL | Freq: Once | INTRAVENOUS | Status: AC | PRN
  Administered 2021-01-15: 16:00:00 100 mL via INTRAVENOUS

## 2021-01-15 MED ORDER — SODIUM CHLORIDE (PF) 0.9 % IJ SOLN
INTRAMUSCULAR | Status: AC
  Filled 2021-01-15: qty 50

## 2021-01-17 ENCOUNTER — Telehealth: Payer: Self-pay | Admitting: *Deleted

## 2021-01-17 NOTE — Telephone Encounter (Signed)
Notified patient as instructed, patient pleased. Discussed follow-up appointments, patient agrees. Appt made for cysto

## 2021-01-17 NOTE — Telephone Encounter (Signed)
-----   Message from Abbie Sons, MD sent at 01/16/2021  7:54 PM EST ----- CT showed no abnormalities which would account for his right sided pain.  He did have a small left inguinal hernia.  Since he did have microscopic blood in urine the lower tract evaluation is a cystoscopy which I would recommend scheduling.  This is primarily recommended to make sure there is no evidence of bladder cancer.

## 2021-01-25 ENCOUNTER — Encounter: Payer: Self-pay | Admitting: Urology

## 2021-01-25 ENCOUNTER — Other Ambulatory Visit: Payer: Self-pay

## 2021-01-25 ENCOUNTER — Ambulatory Visit (INDEPENDENT_AMBULATORY_CARE_PROVIDER_SITE_OTHER): Payer: BC Managed Care – PPO | Admitting: Urology

## 2021-01-25 VITALS — BP 187/94 | HR 85 | Ht 71.0 in | Wt 292.0 lb

## 2021-01-25 DIAGNOSIS — R3129 Other microscopic hematuria: Secondary | ICD-10-CM

## 2021-01-25 LAB — URINALYSIS, COMPLETE
Bilirubin, UA: NEGATIVE
Glucose, UA: NEGATIVE
Ketones, UA: NEGATIVE
Leukocytes,UA: NEGATIVE
Nitrite, UA: NEGATIVE
Protein,UA: NEGATIVE
Specific Gravity, UA: 1.01 (ref 1.005–1.030)
Urobilinogen, Ur: 0.2 mg/dL (ref 0.2–1.0)
pH, UA: 5.5 (ref 5.0–7.5)

## 2021-01-25 MED ORDER — TAMSULOSIN HCL 0.4 MG PO CAPS: 0.4000 mg | ORAL_CAPSULE | Freq: Every day | ORAL | 1 refills | Status: DC

## 2021-01-25 NOTE — Progress Notes (Signed)
   01/25/21  CC:  Chief Complaint  Patient presents with   Cysto    HPI: History chronic right scrotal pain and incidentally noted to have microhematuria.  His scrotal pain persists.  CTU 01/15/2021 showed a small, fat-containing left inguinal hernia no upper tract abnormalities were noted  Blood pressure (!) 187/94, pulse 85, height 5\' 11"  (1.803 m), weight 292 lb (132.5 kg). NED. A&Ox3.   No respiratory distress   Abd soft, NT, ND Normal phallus with bilateral descended testicles  Cystoscopy Procedure Note  Patient identification was confirmed, informed consent was obtained, and patient was prepped using Betadine solution.  Lidocaine jelly was administered per urethral meatus.     Pre-Procedure: - Inspection reveals a normal caliber urethral meatus.  Procedure: The flexible cystoscope was introduced without difficulty - No urethral strictures/lesions are present. -  Mild lateral lobe enlargement  prostate with inflammatory change - Normal bladder neck - Bilateral ureteral orifices identified - Bladder mucosa  reveals no ulcers, tumors, or lesions - No bladder stones - No trabeculation  Retroflexion shows no abnormalities   Post-Procedure: - Patient tolerated the procedure well  Assessment/ Plan: No bladder mucosal lesions noted on cystoscopy No significant upper tract abnormalities CTU He did have inflammatory changes of the prostatic urethra.  We discussed possibility of referred scrotal pain secondary to prostatic inflammation Trial tamsulosin 0.4 mg daily Follow-up 1 month for symptom recheck.  If still symptomatic repeat DRE to evaluate pelvic floor and consider diagnostic cord block   Abbie Sons, MD

## 2021-01-28 ENCOUNTER — Encounter: Payer: Self-pay | Admitting: Urology

## 2021-02-17 ENCOUNTER — Other Ambulatory Visit: Payer: Self-pay | Admitting: Urology

## 2021-02-24 ENCOUNTER — Other Ambulatory Visit: Payer: Self-pay | Admitting: Cardiovascular Disease

## 2021-02-28 ENCOUNTER — Other Ambulatory Visit: Payer: Self-pay

## 2021-02-28 ENCOUNTER — Encounter: Payer: Self-pay | Admitting: Urology

## 2021-02-28 ENCOUNTER — Ambulatory Visit (INDEPENDENT_AMBULATORY_CARE_PROVIDER_SITE_OTHER): Payer: BC Managed Care – PPO | Admitting: Urology

## 2021-02-28 VITALS — BP 171/79 | HR 76 | Ht 70.0 in | Wt 292.0 lb

## 2021-02-28 DIAGNOSIS — R300 Dysuria: Secondary | ICD-10-CM | POA: Diagnosis not present

## 2021-02-28 DIAGNOSIS — R399 Unspecified symptoms and signs involving the genitourinary system: Secondary | ICD-10-CM | POA: Diagnosis not present

## 2021-02-28 MED ORDER — TAMSULOSIN HCL 0.4 MG PO CAPS: 0.4000 mg | ORAL_CAPSULE | Freq: Every day | ORAL | 3 refills | Status: DC

## 2021-02-28 NOTE — Progress Notes (Signed)
02/28/2021 11:22 AM   Eduardo Poole 22-Jan-1971 [id number]  Referring provider: Birdie Sons, MD [address] [place],  [place] [address]  Chief Complaint  Patient presents with   Medication Refill    HPI: 51 y.o. male presents for follow-up visit.  Initially seen 12/21/2020 with a 1 year history of intermittent right hemiscrotal pain Noted to have microhematuria and CTU showed no upper tract abnormalities and a small, fat-containing left inguinal hernia Cystoscopy with mild BPH and inflammatory changes of the prostatic urethra 30-day trial tamsulosin and he presents today for recheck He states his right testis/groin pain resolved while taking tamsulosin and currently not bothersome He has recently noted some mild dysuria and frequency/urgency.  Completed tamsulosin 2 days ago   PMH: Past Medical History:  Diagnosis Date   CAD (coronary artery disease)    a. 07/2014 Inf STEMI/PTCA:  LM nl, LAD nl, LCX nl, RCA ulcerated plaque (PTCA only);  b. 10/2014 MV: intermediate risk, ? inflat isch/scar;  c. 10/2014 Cath: Nl cors w/o RCA restenosis.   Diastolic dysfunction    a. 07/2014 Echo: EF 60-65%, Gr1 DD.   DVT of lower extremity (deep venous thrombosis) ([place])    a. ~ 2009, per pt was on lovenox/coumadin   GERD (gastroesophageal reflux disease)    History of dysplastic nevus 12/07/2018   right post waistline/moderate, left low back/moderate, left mid back lateral near side/moderate    Hyperlipidemia    Hypertension    Hypertensive heart disease    ST elevation myocardial infarction (STEMI) involving right coronary artery with complication ([place]) 03/30/7987   TIA (transient ischemic attack) 07/23/2014    Surgical History: Past Surgical History:  Procedure Laterality Date   ARTHROPLASTY Right 07/09/2010   CARDIAC CATHETERIZATION N/A 07/23/2014   Procedure: Left Heart Cath;  Surgeon: Troy Sine, MD;  Location: [facility];  Service: Cardiovascular;   Laterality: N/A;   CARDIAC CATHETERIZATION N/A 07/23/2014   Procedure: Coronary Balloon Angioplasty;  Surgeon: Troy Sine, MD;  Location: [facility];  Service: Cardiovascular;  Laterality: N/A;   CARDIAC CATHETERIZATION N/A 11/10/2014   Procedure: Left Heart Cath and Coronary Angiography;  Surgeon: Troy Sine, MD;  Location: [facility];  Service: Cardiovascular;  Laterality: N/A;   CERVICAL DISC SURGERY  2015   COLONOSCOPY     COLONOSCOPY     COLONOSCOPY WITH PROPOFOL N/A 11/23/2020   Procedure: COLONOSCOPY WITH PROPOFOL;  Surgeon: Virgel Manifold, MD;  Location: [facility];  Service: Endoscopy;  Laterality: N/A;   JOINT REPLACEMENT Right    Right knee   MENISCUS REPAIR Right 11/03/1995   Meniscus Lateral Tear: repaired by Dr. Sabra Heck at [facility]   REPLACEMENT TOTAL KNEE Right 2011   [facility] Medications:  Allergies as of 02/28/2021   No Known Allergies      Medication List        Accurate as of February 28, 2021 11:22 AM. If you have any questions, ask your nurse or doctor.          amLODipine 5 MG tablet Commonly known as: NORVASC Take 1 tablet (5 mg total) by mouth daily.   aspirin 81 MG EC tablet Take 1 tablet (81 mg total) by mouth daily. What changed: when to take this   atorvastatin 80 MG tablet Commonly known as: LIPITOR TAKE 1 TABLET BY  MOUTH EVERY DAY   clopidogrel 75 MG tablet Commonly known as: PLAVIX TAKE 1 TABLET BY MOUTH EVERY DAY   gabapentin 300 MG capsule Commonly known as: NEURONTIN gabapentin 300 mg capsule  TAKE 1 CAPSULE BY MOUTH TWICE A DAY   meloxicam 7.5 MG tablet Commonly known as: MOBIC meloxicam 7.5 mg tablet  TAKE 1 TABLET BY MOUTH EVERY DAY   metoprolol succinate 50 MG 24 hr tablet Commonly known as: TOPROL-XL Take 1.5 tablets (75 mg total) by mouth daily. Take with or immediately following a meal.   nitroGLYCERIN  0.4 MG SL tablet Commonly known as: NITROSTAT Place 1 tablet (0.4 mg total) under the tongue every 5 (five) minutes as needed for chest pain.   sertraline 100 MG tablet Commonly known as: ZOLOFT Take 2 tablets (200 mg total) by mouth daily.   tamsulosin 0.4 MG Caps capsule Commonly known as: FLOMAX TAKE 1 CAPSULE BY MOUTH EVERY DAY   tiZANidine 4 MG tablet Commonly known as: ZANAFLEX tizanidine 4 mg tablet  TAKE 1 TABLET BY MOUTH EVERY 6 HOURS AS NEEDED   valsartan 320 MG tablet Commonly known as: DIOVAN Take 1 tablet (320 mg total) by mouth daily.        Allergies: No Known Allergies  Family History: Family History  Problem Relation Age of Onset   Hypertension Mother    Arthritis Mother    Diverticulitis Mother    Gout Father    Autoimmune disease Father    Arthritis Sister    Diabetes Paternal Grandfather    Diabetes Maternal Grandfather    Cancer Maternal Grandfather        unknown    Social History:  reports that he has never smoked. He has quit using smokeless tobacco.  His smokeless tobacco use included chew. He reports current alcohol use of about 3.0 standard drinks per week. He reports that he does not use drugs.   Physical Exam: BP (!) 171/79    Pulse 76    Ht 5\' 10"  (1.778 m)    Wt 292 lb (132.5 kg)    BMI 41.90 kg/m   Constitutional:  Alert and oriented, No acute distress. HEENT: [place] AT, moist mucus membranes.  Trachea midline, no masses. Cardiovascular: No clubbing, cyanosis, or edema. Respiratory: Normal respiratory effort, no increased work of breathing. Psychiatric: Normal mood and affect.  Laboratory Data:  Urinalysis Dipstick-trace blood Microscopy negative   Assessment & Plan:    1.  Scrotal pain Resolved  2.  Lower urinary tract symptoms We discussed potential etiologies of BPH and prostatic inflammation Recommend restarting tamsulosin.  If he achieves benefit may continue to take daily or as needed for intermittent symptoms 1  year follow-up and instructed call earlier for worsening symptoms  3. Dysuria UA today shows no evidence of infection or microscopic hematuria   Abbie Sons, MD  [facility] [address] [place], [place] [address] [phone]

## 2021-03-01 LAB — URINALYSIS, COMPLETE
Bilirubin, UA: NEGATIVE
Glucose, UA: NEGATIVE
Ketones, UA: NEGATIVE
Leukocytes,UA: NEGATIVE
Nitrite, UA: NEGATIVE
Protein,UA: NEGATIVE
Specific Gravity, UA: 1.01 (ref 1.005–1.030)
Urobilinogen, Ur: 0.2 mg/dL (ref 0.2–1.0)
pH, UA: 6 (ref 5.0–7.5)

## 2021-03-01 LAB — MICROSCOPIC EXAMINATION
Bacteria, UA: NONE SEEN
Epithelial Cells (non renal): NONE SEEN /hpf (ref 0–10)
WBC, UA: NONE SEEN /hpf (ref 0–5)

## 2021-03-02 ENCOUNTER — Telehealth: Payer: Self-pay | Admitting: *Deleted

## 2021-03-02 NOTE — Telephone Encounter (Signed)
-----   Message from Abbie Sons, MD sent at 03/02/2021  7:05 AM EST ----- Urinalysis showed no evidence of infection

## 2021-03-02 NOTE — Telephone Encounter (Signed)
Notified patient as instructed, patient pleased °

## 2021-03-08 ENCOUNTER — Ambulatory Visit (INDEPENDENT_AMBULATORY_CARE_PROVIDER_SITE_OTHER): Payer: BC Managed Care – PPO | Admitting: Dermatology

## 2021-03-08 ENCOUNTER — Other Ambulatory Visit: Payer: Self-pay

## 2021-03-08 DIAGNOSIS — L578 Other skin changes due to chronic exposure to nonionizing radiation: Secondary | ICD-10-CM

## 2021-03-08 DIAGNOSIS — L730 Acne keloid: Secondary | ICD-10-CM

## 2021-03-08 DIAGNOSIS — L821 Other seborrheic keratosis: Secondary | ICD-10-CM

## 2021-03-08 DIAGNOSIS — Z1283 Encounter for screening for malignant neoplasm of skin: Secondary | ICD-10-CM | POA: Diagnosis not present

## 2021-03-08 DIAGNOSIS — Z86018 Personal history of other benign neoplasm: Secondary | ICD-10-CM

## 2021-03-08 DIAGNOSIS — L820 Inflamed seborrheic keratosis: Secondary | ICD-10-CM | POA: Diagnosis not present

## 2021-03-08 DIAGNOSIS — L209 Atopic dermatitis, unspecified: Secondary | ICD-10-CM

## 2021-03-08 DIAGNOSIS — D1800 Hemangioma unspecified site: Secondary | ICD-10-CM

## 2021-03-08 DIAGNOSIS — D229 Melanocytic nevi, unspecified: Secondary | ICD-10-CM

## 2021-03-08 DIAGNOSIS — L814 Other melanin hyperpigmentation: Secondary | ICD-10-CM

## 2021-03-08 MED ORDER — CLINDAMYCIN PHOSPHATE 1 % EX SOLN: Freq: Every day | CUTANEOUS | 11 refills | Status: AC

## 2021-03-08 MED ORDER — HALOBETASOL PROPIONATE 0.05 % EX FOAM
1.0000 | CUTANEOUS | 6 refills | Status: DC
Start: 2021-03-08 — End: 2021-03-08

## 2021-03-08 MED ORDER — HALOBETASOL PROPIONATE 0.05 % EX FOAM: 1.0000 "application " | CUTANEOUS | 6 refills | Status: AC

## 2021-03-08 NOTE — Progress Notes (Signed)
Follow-Up Visit   Subjective  Eduardo Poole is a 51 y.o. male who presents for the following: Follow-up (Patient here today for 1 year tbse. ). The patient presents for Total-Body Skin Exam (TBSE) for skin cancer screening and mole check.  The patient has spots, moles and lesions to be evaluated, some may be new or changing and the patient has concerns that these could be cancer.  The following portions of the chart were reviewed this encounter and updated as appropriate:  Tobacco   Allergies   Meds   Problems   Med Hx   Surg Hx   Fam Hx      Review of Systems: No other skin or systemic complaints except as noted in HPI or Assessment and Plan.  Objective  Well appearing patient in no apparent distress; mood and affect are within normal limits.  A full examination was performed including scalp, head, eyes, ears, nose, lips, neck, chest, axillae, abdomen, back, buttocks, bilateral upper extremities, bilateral lower extremities, hands, feet, fingers, toes, fingernails, and toenails. All findings within normal limits unless otherwise noted below.  posterior scalp and neck posterior scalp and neck with red papules and pustules and small firm keloidal scars  Left Antecubital area Scaly erythematous papules and patches +/- dyspigmentation, lichenification, excoriations.   right scapula x 1 Erythematous stuck-on, waxy papule or plaque   Assessment & Plan  Acne keloidalis nuchae posterior scalp and neck Chronic, non curable condition   Start Clindamycin 1 % external solution - apply topically to affected areas at back of neck on Monday, Wednesday, and Friday weekly.  Start Lexette 0.05 % foam - apply topically to affected areas of back of neck on Tuesday, Thursday, and Saturday weekly.   Lexet foam to [place]  Clindamycin solution   clindamycin (CLEOCIN T) 1 % external solution - posterior scalp and neck Apply topically daily. Use at effected at back of neck on Monday, Wednesday,  and Friday weekly Halobetasol Propionate (LEXETTE) 0.05 % FOAM - posterior scalp and neck Apply 1 application topically See admin instructions. Apply to affected areas at back of neck daily on Tuesday, Thursday, Saturdays weekly. Can also apply daily as needed for eczema at left arm see handout.  Atopic dermatitis,  Left Antecubital area Atopic dermatitis (eczema) is a chronic, relapsing, pruritic condition that can significantly affect quality of life. It is often associated with allergic rhinitis and/or asthma and can require treatment with topical medications, phototherapy, or in severe cases biologic injectable medication (Dupixent; Adbry) or Oral JAK inhibitors.  Start Lexette 0.05 % foam - apply topically under a thin layer of cerave daily up to 5 days a week as needed for eczema  Inflamed seborrheic keratosis right scapula x 1 Irritated  Destruction of lesion - right scapula x 1 Complexity: simple   Destruction method: cryotherapy   Informed consent: discussed and consent obtained   Timeout:  patient name, date of birth, surgical site, and procedure verified Lesion destroyed using liquid nitrogen: Yes   Region frozen until ice ball extended beyond lesion: Yes   Outcome: patient tolerated procedure well with no complications   Post-procedure details: wound care instructions given   Additional details:  Prior to procedure, discussed risks of blister formation, small wound, skin dyspigmentation, or rare scar following cryotherapy. Recommend Vaseline ointment to treated areas while healing.  Lentigines - Scattered tan macules - Due to sun exposure - Benign-appearing, observe - Recommend daily broad spectrum sunscreen SPF 30+ to sun-exposed areas, reapply every  2 hours as needed. - Call for any changes  Seborrheic Keratoses - Stuck-on, waxy, tan-brown papules and/or plaques  - Benign-appearing - Discussed benign etiology and prognosis. - Observe - Call for any  changes  Melanocytic Nevi - Tan-brown and/or pink-flesh-colored symmetric macules and papules - Benign appearing on exam today - Observation - Call clinic for new or changing moles - Recommend daily use of broad spectrum spf 30+ sunscreen to sun-exposed areas.   Hemangiomas - Red papules - Discussed benign nature - Observe - Call for any changes  Actinic Damage - Chronic condition, secondary to cumulative UV/sun exposure - diffuse scaly erythematous macules with underlying dyspigmentation - Recommend daily broad spectrum sunscreen SPF 30+ to sun-exposed areas, reapply every 2 hours as needed.  - Staying in the shade or wearing long sleeves, sun glasses (UVA+UVB protection) and wide brim hats (4-inch brim around the entire circumference of the hat) are also recommended for sun protection.  - Call for new or changing lesions.  History of Dysplastic Nevi - No evidence of recurrence today at multiple locations see history  - Recommend regular full body skin exams - Recommend daily broad spectrum sunscreen SPF 30+ to sun-exposed areas, reapply every 2 hours as needed.  - Call if any new or changing lesions are noted between office visits  Skin cancer screening performed today.  Return for 1 year tbse. IRuthell Rummage, CMA, am acting as scribe for Sarina Ser, MD.. Documentation: I have reviewed the above documentation for accuracy and completeness, and I agree with the above.  Sarina Ser, MD

## 2021-03-08 NOTE — Patient Instructions (Signed)
For Eczema at left arm use lexette foam under a thin layer a cerave cream daily  Melanoma ABCDEs  Melanoma is the most dangerous type of skin cancer, and is the leading cause of death from skin disease.  You are more likely to develop melanoma if you: Have light-colored skin, light-colored eyes, or red or blond hair Spend a lot of time in the sun Tan regularly, either outdoors or in a tanning bed Have had blistering sunburns, especially during childhood Have a close family member who has had a melanoma Have atypical moles or large birthmarks  Early detection of melanoma is key since treatment is typically straightforward and cure rates are extremely high if we catch it early.   The first sign of melanoma is often a change in a mole or a new dark spot.  The ABCDE system is a way of remembering the signs of melanoma.  A for asymmetry:  The two halves do not match. B for border:  The edges of the growth are irregular. C for color:  A mixture of colors are present instead of an even brown color. D for diameter:  Melanomas are usually (but not always) greater than 75mm - the size of a pencil eraser. E for evolution:  The spot keeps changing in size, shape, and color.  Please check your skin once per month between visits. You can use a small mirror in front and a large mirror behind you to keep an eye on the back side or your body.   If you see any new or changing lesions before your next follow-up, please call to schedule a visit.  Please continue daily skin protection including broad spectrum sunscreen SPF 30+ to sun-exposed areas, reapplying every 2 hours as needed when you're outdoors.   Staying in the shade or wearing long sleeves, sun glasses (UVA+UVB protection) and wide brim hats (4-inch brim around the entire circumference of the hat) are also recommended for sun protection.    If You Need Anything After Your Visit  If you have any questions or concerns for your doctor, please call  our main line at [phone] and press option 4 to reach your doctor's medical assistant. If no one answers, please leave a voicemail as directed and we will return your call as soon as possible. Messages left after 4 pm will be answered the following business day.   You may also send [place] a message via [place]. We typically respond to MyChart messages within 1-2 business days.  For prescription refills, please ask your pharmacy to contact our office. Our fax number is [phone].  If you have an urgent issue when the clinic is closed that cannot wait until the next business day, you can page your doctor at the number below.    Please note that while we do our best to be available for urgent issues outside of office hours, we are not available 24/7.   If you have an urgent issue and are unable to reach [place], you may choose to seek medical care at your doctor's office, retail clinic, urgent care center, or emergency room.  If you have a medical emergency, please immediately call 911 or go to the emergency department.  Pager Numbers  - Dr. Nehemiah Massed: [phone]  - Dr. Laurence Ferrari: [phone]  - Dr. Nicole Kindred: [phone]  In the event of inclement weather, please call our main line at [phone] for an update on the status of any delays or closures.  Dermatology Medication Tips: Please keep  the boxes that topical medications come in in order to help keep track of the instructions about where and how to use these. Pharmacies typically print the medication instructions only on the boxes and not directly on the medication tubes.   If your medication is too expensive, please contact our office at [phone] option 4 or send [place] a message through [place].   We are unable to tell what your co-pay for medications will be in advance as this is different depending on your insurance coverage. However, we may be able to find a substitute medication at lower cost or fill out paperwork to get insurance to  cover a needed medication.   If a prior authorization is required to get your medication covered by your insurance company, please allow [place] 1-2 business days to complete this process.  Drug prices often vary depending on where the prescription is filled and some pharmacies may offer cheaper prices.  The website www.goodrx.com contains coupons for medications through different pharmacies. The prices here do not account for what the cost may be with help from insurance (it may be cheaper with your insurance), but the website can give you the price if you did not use any insurance.  - You can print the associated coupon and take it with your prescription to the pharmacy.  - You may also stop by our office during regular business hours and pick up a GoodRx coupon card.  - If you need your prescription sent electronically to a different pharmacy, notify our office through [facility] or by phone at [phone] option 4.     Si Usted Necesita Algo Despus de Su Visita  Tambin puede enviarnos un mensaje a travs de Pharmacist, community. Por lo general respondemos a los mensajes de MyChart en el transcurso de 1 a 2 das hbiles.  Para renovar recetas, por favor pida a su farmacia que se ponga en contacto con nuestra oficina. Harland Dingwall de fax es [place] [phone].  Si tiene un asunto urgente cuando la clnica est cerrada y que no puede esperar hasta el siguiente da hbil, puede llamar/localizar a su doctor(a) al nmero que aparece a continuacin.   Por favor, tenga en cuenta que aunque hacemos todo lo posible para estar disponibles para asuntos urgentes fuera del horario de [place], no estamos disponibles las 24 horas del da, los 7 das de la [place].   Si tiene un problema urgente y no puede comunicarse con nosotros, puede optar por buscar atencin mdica  en el consultorio de su doctor(a), en una clnica privada, en un centro de atencin urgente o en una sala de emergencias.  Si tiene Conservator, museum/gallery, por favor llame inmediatamente al 911 o vaya a la sala de emergencias.  Nmeros de bper  - Dr. Nehemiah Massed: [phone]  - Dra. Moye: [phone]  - Dra. Nicole Kindred: [phone]  En caso de inclemencias del [place], por favor llame a Johnsie Kindred principal al [phone] para una actualizacin sobre el [place] de cualquier retraso o cierre.  Consejos para la medicacin en dermatologa: Por favor, guarde las cajas en las que vienen los medicamentos de uso tpico para ayudarle a seguir las instrucciones sobre dnde y cmo usarlos. Las farmacias generalmente imprimen las instrucciones del medicamento slo en las cajas y no directamente en los tubos del [place].   Si su medicamento es muy caro, por favor, pngase en contacto con Zigmund Daniel llamando al [phone] y presione la opcin 4 o envenos un mensaje a travs de Pharmacist, community.  No podemos decirle cul ser su copago por los medicamentos por adelantado ya que esto es diferente dependiendo de la cobertura de su seguro. Sin embargo, es posible que podamos encontrar un medicamento sustituto a Electrical engineer un formulario para que el seguro cubra el medicamento que se considera necesario.   Si se requiere una autorizacin previa para que su compaa de seguros [place] su medicamento, por favor permtanos de 1 a 2 das hbiles para completar este proceso.  Los precios de los medicamentos varan con frecuencia dependiendo del Environmental consultant de dnde se surte la receta y alguna farmacias pueden ofrecer precios ms baratos.  El sitio web www.goodrx.com tiene cupones para medicamentos de Airline pilot. Los precios aqu no tienen en cuenta lo que podra costar con la ayuda del seguro (puede ser ms barato con su seguro), pero el sitio web puede darle el precio si no utiliz Research scientist (physical sciences).  - Puede imprimir el cupn correspondiente y llevarlo con su receta a la farmacia.  - Tambin puede pasar por nuestra oficina durante el  horario de atencin regular y Charity fundraiser una tarjeta de cupones de GoodRx.  - Si necesita que su receta se enve electrnicamente a una farmacia diferente, informe a nuestra oficina a travs de MyChart de [place] o por telfono llamando al [phone] y presione la opcin 4.

## 2021-03-13 ENCOUNTER — Encounter: Payer: Self-pay | Admitting: Dermatology

## 2021-03-24 ENCOUNTER — Other Ambulatory Visit: Payer: Self-pay | Admitting: Cardiovascular Disease

## 2021-04-20 DIAGNOSIS — M545 Low back pain, unspecified: Secondary | ICD-10-CM | POA: Diagnosis not present

## 2021-04-20 DIAGNOSIS — M25551 Pain in right hip: Secondary | ICD-10-CM | POA: Diagnosis not present

## 2021-04-20 DIAGNOSIS — M5416 Radiculopathy, lumbar region: Secondary | ICD-10-CM | POA: Diagnosis not present

## 2021-05-28 ENCOUNTER — Other Ambulatory Visit: Payer: Self-pay | Admitting: Cardiovascular Disease

## 2021-06-19 ENCOUNTER — Other Ambulatory Visit: Payer: Self-pay | Admitting: Urology

## 2021-06-22 ENCOUNTER — Other Ambulatory Visit (HOSPITAL_COMMUNITY): Payer: Self-pay | Admitting: Hematology & Oncology

## 2021-06-24 ENCOUNTER — Other Ambulatory Visit: Payer: Self-pay | Admitting: Cardiovascular Disease

## 2021-07-09 ENCOUNTER — Encounter: Payer: Self-pay | Admitting: Family Medicine

## 2021-07-09 ENCOUNTER — Ambulatory Visit: Payer: Self-pay | Admitting: *Deleted

## 2021-07-09 ENCOUNTER — Ambulatory Visit (INDEPENDENT_AMBULATORY_CARE_PROVIDER_SITE_OTHER): Payer: BC Managed Care – PPO | Admitting: Family Medicine

## 2021-07-09 VITALS — BP 128/78 | HR 72 | Temp 98.0°F | Resp 16 | Wt 300.9 lb

## 2021-07-09 DIAGNOSIS — K5792 Diverticulitis of intestine, part unspecified, without perforation or abscess without bleeding: Secondary | ICD-10-CM | POA: Diagnosis not present

## 2021-07-09 MED ORDER — METRONIDAZOLE 500 MG PO TABS: 500.0000 mg | ORAL_TABLET | Freq: Two times a day (BID) | ORAL | 0 refills | Status: AC

## 2021-07-09 MED ORDER — CIPROFLOXACIN HCL 500 MG PO TABS: 500.0000 mg | ORAL_TABLET | Freq: Two times a day (BID) | ORAL | 0 refills | Status: AC

## 2021-07-09 MED ORDER — KETOROLAC TROMETHAMINE 60 MG/2ML IM SOLN
60.0000 mg | Freq: Once | INTRAMUSCULAR | Status: AC
  Administered 2021-07-09: 60 mg via INTRAMUSCULAR

## 2021-07-09 NOTE — Telephone Encounter (Signed)
  Chief Complaint: left lower abd pain dull pain all the time. Symptoms: When stands or sits up the pain is much worse and when bends over Frequency: Dull pain all the time worse with movement Pertinent Negatives: Patient denies urinary symptoms, diarrhea or fever. Disposition: '[]'$ ED /'[]'$ Urgent Care (no appt availability in office) / '[x]'$ Appointment(In office/virtual)/ '[]'$  [facility]/ '[]'$ Home Care/ '[]'$ Refused Recommended Disposition /'[]'$  Mobile Bus/ '[]'$  Follow-up with PCP Additional Notes: Appt made for today with Dr. Caryn Section for 10:40.

## 2021-07-09 NOTE — Progress Notes (Signed)
I,Eduardo Poole,acting as a scribe for Eduardo Huh, MD.,have documented all relevant documentation on the behalf of Eduardo Huh, MD,as directed by  Eduardo Huh, MD while in the presence of Eduardo Huh, MD.   Established patient visit   Patient: Eduardo Poole   DOB: Sep 15, 1970   51 y.o. Male  MRN: [id number] Visit Date: 07/09/2021  Today's healthcare provider: Lelon Huh, MD   Chief Complaint  Patient presents with   Abdominal Pain  Abdominal pain Subjective     Abdominal Pain  He reports new onset abdominal pain. The most recent episode started a few days ago and is worsening. The abdominal pain is located in the left lower quadrant and does not radiate. It has not moved in location or changed in character since onset, other than being more intense. It is described as aching, burning, hot, sharp, shooting, and stabbing, is severe in intensity, occurring constantly. It is aggravated by moving and is relieved by nothing. He has tried  muscle relaxer with no relief. Pain does not radiate into inguinal area, groin , or back. He does have known sigmoid diverticulosis seen on most recent colonoscopy.   Associated symptoms: No anorexia  No belching  No bloody stool No blood in urine   No constipation No diarrhea  No dysuria No fever  No flatus No headaches  No headaches No joint pains  No myalgias No nausea  No vomiting No weight loss     Recent GI studies:colonoscopy Relevant medical history includes: mother has diverticulitis    Previous labs Lab Results  Component Value Date   WBC 7.0 10/27/2020   HGB 15.1 10/27/2020   HCT 45.5 10/27/2020   MCV 89 10/27/2020   MCH 29.6 10/27/2020   RDW 13.1 10/27/2020   PLT 278 10/27/2020   Lab Results  Component Value Date   GLUCOSE 103 (H) 10/27/2020   NA 138 10/27/2020   K 4.5 10/27/2020   CL 99 10/27/2020   CO2 23 10/27/2020   BUN 15 10/27/2020   CREATININE 1.00 01/15/2021   GFRNONAA 87 10/12/2019   GFRAA  100 10/12/2019   CALCIUM 9.6 10/27/2020   PROT 7.1 10/27/2020   ALBUMIN 4.5 10/27/2020   LABGLOB 2.6 10/27/2020   AGRATIO 1.7 10/27/2020   BILITOT 0.3 10/27/2020   ALKPHOS 85 10/27/2020   AST 25 10/27/2020   ALT 37 10/27/2020   ANIONGAP 9 11/07/2015   No results found for: AMYLASE -----------------------------------------------------------------------------------------   Medications: Outpatient Medications Prior to Visit  Medication Sig   amLODipine (NORVASC) 5 MG tablet TAKE 1 TABLET (5 MG TOTAL) BY MOUTH DAILY.   aspirin EC 81 MG EC tablet Take 1 tablet (81 mg total) by mouth daily. (Patient taking differently: Take 81 mg by mouth at bedtime.)   atorvastatin (LIPITOR) 80 MG tablet TAKE 1 TABLET BY MOUTH EVERY DAY   clindamycin (CLEOCIN T) 1 % external solution Apply topically daily. Use at effected at back of neck on Monday, Wednesday, and Friday weekly   clopidogrel (PLAVIX) 75 MG tablet TAKE 1 TABLET BY MOUTH EVERY DAY   gabapentin (NEURONTIN) 300 MG capsule gabapentin 300 mg capsule  TAKE 1 CAPSULE BY MOUTH TWICE A DAY   Halobetasol Propionate (LEXETTE) 0.05 % FOAM Apply 1 application topically See admin instructions. Apply to affected areas at back of neck daily on Tuesday, Thursday, Saturdays weekly. Can also apply daily as needed for eczema at left arm see handout.   meloxicam (MOBIC) 7.5 MG tablet meloxicam  7.5 mg tablet  TAKE 1 TABLET BY MOUTH EVERY DAY   metoprolol succinate (TOPROL-XL) 50 MG 24 hr tablet Take 1.5 tablets (75 mg total) by mouth daily. Take with or immediately following a meal.   nitroGLYCERIN (NITROSTAT) 0.4 MG SL tablet Place 1 tablet (0.4 mg total) under the tongue every 5 (five) minutes as needed for chest pain.   sertraline (ZOLOFT) 100 MG tablet Take 2 tablets (200 mg total) by mouth daily.   tamsulosin (FLOMAX) 0.4 MG CAPS capsule TAKE 1 CAPSULE BY MOUTH EVERY DAY   tiZANidine (ZANAFLEX) 4 MG tablet tizanidine 4 mg tablet  TAKE 1 TABLET BY MOUTH  EVERY 6 HOURS AS NEEDED   valsartan (DIOVAN) 320 MG tablet Take 1 tablet (320 mg total) by mouth daily.   No facility-administered medications prior to visit.         Objective    BP 128/78 (BP Location: Right Arm, Patient Position: Supine, Cuff Size: Large)   Pulse 72   Temp 98 F (36.7 C) (Oral)   Resp 16   Wt (!) 300 lb 14.4 oz (136.5 kg)   SpO2 98%   BMI 43.17 kg/m    Physical Exam   General: Appearance:    Mildly obese male in no acute distress  Abd:    Tender left lower quadrant. No rebound. No guarding. No masses, no erythema. BS normoactive in all quadrants. No tenderness of inguinal area or other areas of abdomen. No CVAT.       Assessment & Plan     1. Diverticulitis  - ketorolac (TORADOL) injection 60 mg  - metroNIDAZOLE (FLAGYL) 500 MG tablet; Take 1 tablet (500 mg total) by mouth 2 (two) times daily for 10 days.  Dispense: 20 tablet; Refill: 0 - ciprofloxacin (CIPRO) 500 MG tablet; Take 1 tablet (500 mg total) by mouth 2 (two) times daily for 10 days.  Dispense: 20 tablet; Refill: 0   Given strict verbal instructions to go to ER if any new symptoms develop, if pain worsens, or if not improving within 24 hours.      The entirety of the information documented in the History of Present Illness, Review of Systems and Physical Exam were personally obtained by me. Portions of this information were initially documented by the CMA and reviewed by me for thoroughness and accuracy.     Eduardo Huh, MD  [facility] [phone] (phone) [phone] (fax)  [place]

## 2021-07-09 NOTE — Patient Instructions (Signed)
Please review the attached list of medications and notify my office if there are any errors.   Go to the emergency room if you develop any new symptoms such as fever, dizziness, nausea, vomiting, dark of bloody stool, or if your pain is not improved with 24 hours.

## 2021-07-09 NOTE — Telephone Encounter (Signed)
Reason for Disposition  [1] MODERATE pain (e.g., interferes with normal activities) AND [2] pain comes and goes (cramps) AND [3] present > 24 hours  (Exception: pain with Vomiting or Diarrhea - see that Guideline)  Answer Assessment - Initial Assessment Questions 1. LOCATION: "Where does it hurt?"      Having left sided abd pain off to side of my belly button.   When laying down not so bad.   Sitting up it's painful.    2. RADIATION: "Does the pain shoot anywhere else?" (e.g., chest, back)     My lower back is hurting too.    I have a displaced vertebra that puts pressure on a nerve.   It catches my breath when I move. 3. ONSET: "When did the pain begin?" (Minutes, hours or days ago)      Sat.    Got better after having BM that afternoon.   'Sunday morning in a lot of pain.    If try to bend over it would hurt bad.   This morning it\'s hurting in my side. 4. SUDDEN: "Gradual or sudden onset?"     Sudden 5. PATTERN "Does the pain come and go, or is it constant?"    - If constant: "Is it getting better, staying the same, or worsening?"      (Note: Constant means the pain never goes away completely; most serious pain is constant and it progresses)     - If intermittent: "How long does it last?" "Do you have pain now?"     (Note: Intermittent means the pain goes away completely between bouts)      6. SEVERITY: "How bad is the pain?"  (e.g., Scale 1-10; mild, moderate, or severe)    - MILD (1-3): doesn\'t interfere with normal activities, abdomen soft and not tender to touch     - MODERATE (4-7): interferes with normal activities or awakens from sleep, abdomen tender to touch     - SEVERE (8-10): excruciating pain, doubled over, unable to do any normal activities       Dull pain all the time except with bending or leaning forward.   I took a muscle relaxer on Sun. Hoping that would help but it did not. 7. RECURRENT SYMPTOM: "Have you ever had this type of stomach pain before?" If Yes, ask: "When was  the last time?" and "What happened that time?"       8. CAUSE: "What do you think is causing the stomach pain?"      9. RELIEVING/AGGRAVATING FACTORS: "What makes it better or worse?" (e.g., movement, antacids, bowel movement)      10'$ . OTHER SYMPTOMS: "Do you have any other symptoms?" (e.g., back pain, diarrhea, fever, urination pain, vomiting)       I'm seeing a urologist for enlarged prostate.   But no urinary symptoms.  Protocols used: Abdominal Pain - Male-A-AH

## 2021-07-10 ENCOUNTER — Emergency Department (HOSPITAL_COMMUNITY): Payer: BC Managed Care – PPO

## 2021-07-10 ENCOUNTER — Emergency Department (HOSPITAL_COMMUNITY)
Admission: EM | Admit: 2021-07-10 | Discharge: 2021-07-10 | Disposition: A | Discharge status: Home / Self Care | Payer: BC Managed Care – PPO | Attending: Emergency Medicine | Admitting: Emergency Medicine

## 2021-07-10 DIAGNOSIS — K760 Fatty (change of) liver, not elsewhere classified: Secondary | ICD-10-CM | POA: Diagnosis not present

## 2021-07-10 DIAGNOSIS — K5792 Diverticulitis of intestine, part unspecified, without perforation or abscess without bleeding: Secondary | ICD-10-CM | POA: Diagnosis not present

## 2021-07-10 DIAGNOSIS — R1032 Left lower quadrant pain: Secondary | ICD-10-CM | POA: Diagnosis not present

## 2021-07-10 DIAGNOSIS — Z7901 Long term (current) use of anticoagulants: Secondary | ICD-10-CM | POA: Diagnosis not present

## 2021-07-10 DIAGNOSIS — Z7982 Long term (current) use of aspirin: Secondary | ICD-10-CM | POA: Insufficient documentation

## 2021-07-10 DIAGNOSIS — K5732 Diverticulitis of large intestine without perforation or abscess without bleeding: Secondary | ICD-10-CM | POA: Insufficient documentation

## 2021-07-10 DIAGNOSIS — K5730 Diverticulosis of large intestine without perforation or abscess without bleeding: Secondary | ICD-10-CM | POA: Diagnosis not present

## 2021-07-10 DIAGNOSIS — K769 Liver disease, unspecified: Secondary | ICD-10-CM | POA: Diagnosis not present

## 2021-07-10 LAB — CBC WITH DIFFERENTIAL/PLATELET
Abs Immature Granulocytes: 0.03 10*3/uL (ref 0.00–0.07)
Basophils Absolute: 0.1 10*3/uL (ref 0.0–0.1)
Basophils Relative: 0 %
Eosinophils Absolute: 0.3 10*3/uL (ref 0.0–0.5)
Eosinophils Relative: 3 %
HCT: 41.8 % (ref 39.0–52.0)
Hemoglobin: 13.8 g/dL (ref 13.0–17.0)
Immature Granulocytes: 0 %
Lymphocytes Relative: 18 %
Lymphs Abs: 2.1 10*3/uL (ref 0.7–4.0)
MCH: 30 pg (ref 26.0–34.0)
MCHC: 33 g/dL (ref 30.0–36.0)
MCV: 90.9 fL (ref 80.0–100.0)
Monocytes Absolute: 0.7 10*3/uL (ref 0.1–1.0)
Monocytes Relative: 6 %
Neutro Abs: 8 10*3/uL — ABNORMAL HIGH (ref 1.7–7.7)
Neutrophils Relative %: 73 %
Platelets: 280 10*3/uL (ref 150–400)
RBC: 4.6 MIL/uL (ref 4.22–5.81)
RDW: 13 % (ref 11.5–15.5)
WBC: 11.2 10*3/uL — ABNORMAL HIGH (ref 4.0–10.5)
nRBC: 0 % (ref 0.0–0.2)

## 2021-07-10 LAB — COMPREHENSIVE METABOLIC PANEL
ALT: 32 U/L (ref 0–44)
AST: 24 U/L (ref 15–41)
Albumin: 3.8 g/dL (ref 3.5–5.0)
Alkaline Phosphatase: 75 U/L (ref 38–126)
Anion gap: 8 (ref 5–15)
BUN: 16 mg/dL (ref 6–20)
CO2: 25 mmol/L (ref 22–32)
Calcium: 9.1 mg/dL (ref 8.9–10.3)
Chloride: 105 mmol/L (ref 98–111)
Creatinine, Ser: 0.94 mg/dL (ref 0.61–1.24)
GFR, Estimated: >60 mL/min (ref >60)
Glucose, Bld: 105 mg/dL — ABNORMAL HIGH (ref 70–99)
Potassium: 3.7 mmol/L (ref 3.5–5.1)
Sodium: 138 mmol/L (ref 135–145)
Total Bilirubin: 0.9 mg/dL (ref 0.3–1.2)
Total Protein: 6.8 g/dL (ref 6.5–8.1)

## 2021-07-10 LAB — URINALYSIS, ROUTINE W REFLEX MICROSCOPIC
Bacteria, UA: NONE SEEN
Bilirubin Urine: NEGATIVE
Glucose, UA: NEGATIVE mg/dL
Ketones, ur: NEGATIVE mg/dL
Leukocytes,Ua: NEGATIVE
Nitrite: NEGATIVE
Protein, ur: NEGATIVE mg/dL
Specific Gravity, Urine: 1.012 (ref 1.005–1.030)
pH: 5 (ref 5.0–8.0)

## 2021-07-10 LAB — LIPASE, BLOOD: Lipase: 22 U/L (ref 11–51)

## 2021-07-10 MED ORDER — IOHEXOL 350 MG/ML SOLN
100.0000 mL | Freq: Once | INTRAVENOUS | Status: AC | PRN
  Administered 2021-07-10: 100 mL via INTRAVENOUS

## 2021-07-10 NOTE — ED Provider Triage Note (Signed)
Emergency Medicine Provider Triage Evaluation Note  Eduardo Poole , a 51 y.o. male  was evaluated in triage.  Pt complains of left lower quadrant abdominal pain.  Symptoms began on 07/07/2021.  He had an episode of none bloody diarrhea but this subsided.  Reports nausea but denies any vomiting and this began today.  He was seen and evaluated by PCP yesterday, diagnosed with diverticulitis based on physical exam and was placed on ciprofloxacin and Flagyl.  States that his pain has not improved and he is still nauseous and was sent to the ER by PCP for potential imaging.  He has never been diagnosed with diverticulitis in the past.  He has been compliant with all of his medications including his anticoagulation.  No sick contacts with similar symptoms.  No prior abdominal surgeries.  Review of Systems  Positive: Abdominal pain, nausea Negative: Bloody stools, diarrhea, emesis  Physical Exam  BP 138/78 (BP Location: Right Arm)   Pulse 74   Temp 98.4 F (36.9 C) (Oral)   Resp 17   SpO2 96%  Gen:   Awake, no distress   Resp:  Normal effort  MSK:   Moves extremities without difficulty  Other:  Tenderness of the left lower quadrant without rebound or guarding  Medical Decision Making  Medically screening exam initiated at 1:59 PM.  Appropriate orders placed.  Eduardo Poole was informed that the remainder of the evaluation will be completed by another provider, this initial triage assessment does not replace that evaluation, and the importance of remaining in the ED until their evaluation is complete.  Labs and imaging ordered   Delia Heady, PA-C 07/10/21 1400

## 2021-07-10 NOTE — Discharge Instructions (Signed)
Your CT scan of your abdomen showed that you have uncomplicated diverticulitis.  This should improve with the antibiotics that were given by your primary care provider yesterday.  Continue taking these antibiotics as prescribed. You can take Tylenol as needed for pain. Your symptoms may take 48 hours after starting the antibiotics to improve. Return to the ER at any point if your symptoms worsen, you have bloody stools, fever or continued vomiting. There is an incidental finding of a lesion on your liver on your CT scan.  Inform your primary care provider about this however your liver labs today are normal.

## 2021-07-10 NOTE — ED Triage Notes (Signed)
Pt. Stated, Im having LLQ pain for the past few days.  Had some diarrhea on Sunday but nothing since.

## 2021-07-10 NOTE — ED Provider Notes (Signed)
[facility] Provider Note   CSN: [id number] Arrival date & time: 07/10/21  1319     History  Chief Complaint  Patient presents with   Abdominal Pain    Eduardo Poole is a 51 y.o. male presenting to the ED with a chief complaint of left lower quadrant abdominal pain.  Symptoms began 3 days ago.  He had 1 episode of nonbloody diarrhea.  Reports nausea but denies any vomiting.  Seen and evaluated by PCP yesterday, diagnosed with diverticulitis based on physical exam and was prescribed Cipro and Flagyl.  Today he continues to be symptomatic and is now nauseous.  No history of diverticulitis in the past.  Denies any hematemesis, fever, chest pain or prior abdominal surgeries.  Abdominal Pain Associated symptoms: nausea   Associated symptoms: no chest pain, no chills, no constipation, no cough, no diarrhea, no dysuria, no fever, no hematuria, no shortness of breath, no sore throat and no vomiting       Home Medications Prior to Admission medications   Medication Sig Start Date End Date Taking? Authorizing Provider  amLODipine (NORVASC) 5 MG tablet TAKE 1 TABLET (5 MG TOTAL) BY MOUTH DAILY. 06/25/21   Troy Sine, MD  aspirin EC 81 MG EC tablet Take 1 tablet (81 mg total) by mouth daily. Patient taking differently: Take 81 mg by mouth at bedtime. 07/26/14   Brett Canales, PA-C  atorvastatin (LIPITOR) 80 MG tablet TAKE 1 TABLET BY MOUTH EVERY DAY 05/28/21   Troy Sine, MD  ciprofloxacin (CIPRO) 500 MG tablet Take 1 tablet (500 mg total) by mouth 2 (two) times daily for 10 days. 07/09/21 07/19/21  Birdie Sons, MD  clindamycin (CLEOCIN T) 1 % external solution Apply topically daily. Use at effected at back of neck on Monday, Wednesday, and Friday weekly 03/08/21 03/08/22  Ralene Bathe, MD  clopidogrel (PLAVIX) 75 MG tablet TAKE 1 TABLET BY MOUTH EVERY DAY 03/26/21   Troy Sine, MD  gabapentin (NEURONTIN) 300 MG capsule gabapentin 300 mg  capsule  TAKE 1 CAPSULE BY MOUTH TWICE A DAY    [provider]  Halobetasol Propionate (LEXETTE) 0.05 % FOAM Apply 1 application topically See admin instructions. Apply to affected areas at back of neck daily on Tuesday, Thursday, Saturdays weekly. Can also apply daily as needed for eczema at left arm see handout. 03/08/21   Ralene Bathe, MD  metoprolol succinate (TOPROL-XL) 50 MG 24 hr tablet Take 1.5 tablets (75 mg total) by mouth daily. Take with or immediately following a meal. 09/11/20   Troy Sine, MD  metroNIDAZOLE (FLAGYL) 500 MG tablet Take 1 tablet (500 mg total) by mouth 2 (two) times daily for 10 days. 07/09/21 07/19/21  Birdie Sons, MD  nitroGLYCERIN (NITROSTAT) 0.4 MG SL tablet Place 1 tablet (0.4 mg total) under the tongue every 5 (five) minutes as needed for chest pain. 07/26/14   Brett Canales, PA-C  sertraline (ZOLOFT) 100 MG tablet Take 2 tablets (200 mg total) by mouth daily. 11/20/20   Gwyneth Sprout, FNP  tamsulosin (FLOMAX) 0.4 MG CAPS capsule TAKE 1 CAPSULE BY MOUTH EVERY DAY 06/19/21   Stoioff, Ronda Fairly, MD  tiZANidine (ZANAFLEX) 4 MG tablet tizanidine 4 mg tablet  TAKE 1 TABLET BY MOUTH EVERY 6 HOURS AS NEEDED    [provider]  valsartan (DIOVAN) 320 MG tablet Take 1 tablet (320 mg total) by mouth daily. 09/11/20   Claiborne Billings,  Joyice Faster, MD      Allergies    Patient has no known allergies.    Review of Systems   Review of Systems  Constitutional:  Negative for appetite change, chills and fever.  HENT:  Negative for ear pain, rhinorrhea, sneezing and sore throat.   Eyes:  Negative for photophobia and visual disturbance.  Respiratory:  Negative for cough, chest tightness, shortness of breath and wheezing.   Cardiovascular:  Negative for chest pain and palpitations.  Gastrointestinal:  Positive for abdominal pain and nausea. Negative for blood in stool, constipation, diarrhea and vomiting.  Genitourinary:  Negative for dysuria, hematuria and urgency.   Musculoskeletal:  Negative for myalgias.  Skin:  Negative for rash.  Neurological:  Negative for dizziness, weakness and light-headedness.   Physical Exam Updated Vital Signs BP (!) 154/95   Pulse 69   Temp 98.4 F (36.9 C) (Oral)   Resp 17   Ht '5\' 10"'$  (1.778 m)   Wt (!) 136.5 kg   SpO2 96%   BMI 43.19 kg/m  Physical Exam Vitals and nursing note reviewed.  Constitutional:      General: He is not in acute distress.    Appearance: He is well-developed.  HENT:     Head: Normocephalic and atraumatic.     Nose: Nose normal.  Eyes:     General: No scleral icterus.       Left eye: No discharge.     Conjunctiva/sclera: Conjunctivae normal.  Cardiovascular:     Rate and Rhythm: Normal rate and regular rhythm.     Heart sounds: Normal heart sounds. No murmur heard.   No friction rub. No gallop.  Pulmonary:     Effort: Pulmonary effort is normal. No respiratory distress.     Breath sounds: Normal breath sounds.  Abdominal:     General: Bowel sounds are normal. There is no distension.     Palpations: Abdomen is soft.     Tenderness: There is abdominal tenderness in the left lower quadrant. There is no guarding.  Musculoskeletal:        General: Normal range of motion.     Cervical back: Normal range of motion and neck supple.  Skin:    General: Skin is warm and dry.     Findings: No rash.  Neurological:     Mental Status: He is alert.     Motor: No abnormal muscle tone.     Coordination: Coordination normal.    ED Results / Procedures / Treatments   Labs (all labs ordered are listed, but only abnormal results are displayed) Labs Reviewed  COMPREHENSIVE METABOLIC PANEL - Abnormal; Notable for the following components:      Result Value   Glucose, Bld 105 (*)    All other components within normal limits  CBC WITH DIFFERENTIAL/PLATELET - Abnormal; Notable for the following components:   WBC 11.2 (*)    Neutro Abs 8.0 (*)    All other components within normal limits   URINALYSIS, ROUTINE W REFLEX MICROSCOPIC - Abnormal; Notable for the following components:   Hgb urine dipstick SMALL (*)    All other components within normal limits  LIPASE, BLOOD    EKG None  Radiology CT ABDOMEN PELVIS W CONTRAST  Result Date: 07/10/2021 CLINICAL DATA:  Left lower quadrant abdominal pain. EXAM: CT ABDOMEN AND PELVIS WITH CONTRAST TECHNIQUE: Multidetector CT imaging of the abdomen and pelvis was performed using the standard protocol following bolus administration of intravenous contrast. RADIATION DOSE REDUCTION:  This exam was performed according to the departmental dose-optimization program which includes automated exposure control, adjustment of the mA and/or kV according to patient size and/or use of iterative reconstruction technique. CONTRAST:  134m OMNIPAQUE IOHEXOL 350 MG/ML SOLN COMPARISON:  January 15, 2021 FINDINGS: Lower chest: No acute abnormality. Hepatobiliary: Diffuse hepatic steatosis. Subcentimeter hypodense lesion in the right lobe of the liver is technically too small to accurately characterize but statistically likely to reflect cysts. Gallbladder is unremarkable. No biliary ductal dilation. Pancreas: No pancreatic ductal dilation or evidence of acute inflammation. Spleen: No splenomegaly or focal splenic lesion. Adrenals/Urinary Tract: Bilateral adrenal glands appear normal. No hydronephrosis. Kidneys demonstrate symmetric enhancement and excretion of contrast material. Urinary bladder is unremarkable for degree of distension. Stomach/Bowel: No radiopaque enteric contrast material was administered. Stomach is unremarkable for degree of distension. There is no pathologic dilation of large or small bowel. Colonic diverticulosis with acute descending/sigmoid colonic diverticulitis. Vascular/Lymphatic: Normal caliber abdominal aorta. No pathologically enlarged abdominal or pelvic lymph nodes. Reproductive: Prostate is unremarkable. Other: No walled off fluid  collections.  No pneumoperitoneum. Musculoskeletal: Degenerative changes spine and bilateral hips. No acute osseous abnormality. IMPRESSION: 1. Acute uncomplicated descending/sigmoid colonic diverticulitis. 2. Diffuse hepatic steatosis. Electronically Signed   By: JDahlia BailiffM.D.   On: 07/10/2021 16:51    Procedures Procedures    Medications Ordered in ED Medications  iohexol (OMNIPAQUE) 350 MG/ML injection 100 mL (100 mLs Intravenous Contrast Given 07/10/21 1647)    ED Course/ Medical Decision Making/ A&P                           Medical Decision Making Amount and/or Complexity of Data Reviewed Labs: ordered. Radiology: ordered.  Risk Prescription drug management.   51year old male presenting to the ED for left lower quadrant pain and nausea.  I evaluated patient in triage.  Order labs.  He had tenderness palpation of the left lower quadrant without rebound or guarding.  He was hemodynamically stable.  Lab work significant for mild leukocytosis of 11.2.  Urinalysis is unremarkable.  CMP and lipase unremarkable.  CT of the abdomen pelvis was done due to continued pain in the left lower quadrant without known history of diverticulitis.  This demonstrates acute uncomplicated sigmoid diverticulitis.  Incidental liver lesion as well.  Patient was informed of this.  On recheck patient appears overall well remains hemodynamically stable.  I offered antiemetic but he declines.  He has treatment for diverticulitis given by his PCP already so I informed him that he should continue with this treatment and that his symptoms are likely due to the diverticulitis.  Patient is agreeable to the plan.  Advised Tylenol as needed for pain.  Return precautions given.    Patient is hemodynamically stable, in NAD, and able to ambulate in the ED. Evaluation does not show pathology that would require ongoing emergent intervention or inpatient treatment. I explained the diagnosis to the patient. Pain has been  managed and has no complaints prior to discharge. Patient is comfortable with above plan and is stable for discharge at this time. All questions were answered prior to disposition. Strict return precautions for returning to the ED were discussed. Encouraged follow up with PCP.   An After Visit Summary was printed and given to the patient.   Portions of this note were generated with [employer] Dictation errors may occur despite best attempts at proofreading.         Final  Clinical Impression(s) / ED Diagnoses Final diagnoses:  Diverticulitis  Liver lesion    Rx / DC Orders ED Discharge Orders     None         Delia Heady, PA-C 07/10/21 1950    Regan Lemming, MD 07/10/21 2046

## 2021-09-07 ENCOUNTER — Other Ambulatory Visit: Payer: Self-pay | Admitting: Cardiovascular Disease

## 2021-09-13 ENCOUNTER — Other Ambulatory Visit: Payer: Self-pay | Admitting: Cardiovascular Disease

## 2021-10-30 ENCOUNTER — Encounter: Payer: Self-pay | Admitting: Family Medicine

## 2021-10-30 ENCOUNTER — Ambulatory Visit (INDEPENDENT_AMBULATORY_CARE_PROVIDER_SITE_OTHER): Payer: BC Managed Care – PPO | Admitting: Family Medicine

## 2021-10-30 VITALS — BP 133/80 | HR 72 | Resp 16 | Ht 70.0 in | Wt 298.0 lb

## 2021-10-30 DIAGNOSIS — I2510 Atherosclerotic heart disease of native coronary artery without angina pectoris: Secondary | ICD-10-CM | POA: Diagnosis not present

## 2021-10-30 DIAGNOSIS — Z23 Encounter for immunization: Secondary | ICD-10-CM

## 2021-10-30 DIAGNOSIS — Z0000 Encounter for general adult medical examination without abnormal findings: Secondary | ICD-10-CM | POA: Diagnosis not present

## 2021-10-30 DIAGNOSIS — I10 Essential (primary) hypertension: Secondary | ICD-10-CM | POA: Diagnosis not present

## 2021-10-30 DIAGNOSIS — R739 Hyperglycemia, unspecified: Secondary | ICD-10-CM

## 2021-10-30 DIAGNOSIS — Z125 Encounter for screening for malignant neoplasm of prostate: Secondary | ICD-10-CM

## 2021-10-30 DIAGNOSIS — F32A Depression, unspecified: Secondary | ICD-10-CM

## 2021-10-30 MED ORDER — WEGOVY 0.25 MG/0.5ML ~~LOC~~ SOAJ: SUBCUTANEOUS | 0 refills | Status: DC

## 2021-10-30 NOTE — Progress Notes (Signed)
I,Tiffany J Bragg,acting as a scribe for Lelon Huh, MD.,have documented all relevant documentation on the behalf of Lelon Huh, MD,as directed by  Lelon Huh, MD while in the presence of Lelon Huh, MD.  Complete physical exam   Patient: Eduardo Poole   DOB: Aug 02, 1970   51 y.o. Male  MRN: [id number] Visit Date: 10/30/2021  Today's healthcare provider: Lelon Huh, MD   Chief Complaint  Patient presents with   Annual Exam   Subjective    Eduardo Poole is a 51 y.o. male who presents today for a complete physical exam.  He reports consuming a general diet. The patient does not participate in regular exercise at present. He generally feels fairly well. He reports sleeping well. He does not have additional problems to discuss today.    Past Medical History:  Diagnosis Date   CAD (coronary artery disease)    a. 07/2014 Inf STEMI/PTCA:  LM nl, LAD nl, LCX nl, RCA ulcerated plaque (PTCA only);  b. 10/2014 MV: intermediate risk, ? inflat isch/scar;  c. 10/2014 Cath: Nl cors w/o RCA restenosis.   Diastolic dysfunction    a. 07/2014 Echo: EF 60-65%, Gr1 DD.   DVT of lower extremity (deep venous thrombosis) ([place])    a. ~ 2009, per pt was on lovenox/coumadin   GERD (gastroesophageal reflux disease)    History of dysplastic nevus 12/07/2018   right post waistline/moderate, left low back/moderate, left mid back lateral near side/moderate    Hyperlipidemia    Hypertension    Hypertensive heart disease    ST elevation myocardial infarction (STEMI) involving right coronary artery with complication ([place]) 07/26/3417   TIA (transient ischemic attack) 07/23/2014   Past Surgical History:  Procedure Laterality Date   ARTHROPLASTY Right 07/09/2010   CARDIAC CATHETERIZATION N/A 07/23/2014   Procedure: Left Heart Cath;  Surgeon: Troy Sine, MD;  Location: [place] [facility];  Service: Cardiovascular;  Laterality: N/A;   CARDIAC CATHETERIZATION N/A 07/23/2014   Procedure:  Coronary Balloon Angioplasty;  Surgeon: Troy Sine, MD;  Location: [place] [facility];  Service: Cardiovascular;  Laterality: N/A;   CARDIAC CATHETERIZATION N/A 11/10/2014   Procedure: Left Heart Cath and Coronary Angiography;  Surgeon: Troy Sine, MD;  Location: [place] [facility];  Service: Cardiovascular;  Laterality: N/A;   CERVICAL DISC SURGERY  2015   COLONOSCOPY     COLONOSCOPY     COLONOSCOPY WITH PROPOFOL N/A 11/23/2020   Procedure: COLONOSCOPY WITH PROPOFOL;  Surgeon: Virgel Manifold, MD;  Location: [facility];  Service: Endoscopy;  Laterality: N/A;   JOINT REPLACEMENT Right    Right knee   MENISCUS REPAIR Right 11/03/1995   Meniscus Lateral Tear: repaired by Dr. Sabra Heck at [facility]   REPLACEMENT TOTAL KNEE Right 2011   [facility]   Social History   Socioeconomic History   Marital status: Married    Spouse name: Not on file   Number of children: 2   Years of education: 12   Highest education level: Not on file  Occupational History   Occupation: Employed    Comment: Works at [employer] Use   Smoking status: Never   Smokeless tobacco: Former    Types: Nurse, children's Use: Never used  Substance and Sexual Activity   Alcohol use: Yes    Alcohol/week: 3.0 standard drinks of alcohol  Types: 2 Cans of beer, 1 Standard drinks or equivalent per week    Comment: none last 24 hrs   Drug use: No   Sexual activity: Yes  Other Topics Concern   Not on file  Social History Narrative   Not on file   Social Determinants of Health   Financial Resource Strain: Not on file  Food Insecurity: Not on file  Transportation Needs: Not on file  Physical Activity: Not on file  Stress: Not on file  Social Connections: Not on file  Intimate Partner Violence: Not on file   Family Status  Relation Name Status   Mother  Alive   Father  Alive   Sister  Alive   PGF  (Not  Specified)   MGF  Deceased   Family History  Problem Relation Age of Onset   Hypertension Mother    Arthritis Mother    Diverticulitis Mother    Gout Father    Autoimmune disease Father    Arthritis Sister    Diabetes Paternal Grandfather    Diabetes Maternal Grandfather    Cancer Maternal Grandfather        unknown   No Known Allergies  Patient Care Team: Birdie Sons, MD as PCP - General (Family Medicine) Troy Sine, MD as PCP - Cardiology (Cardiology) Burnis Kingfisher, MD as Referring Physician (Orthopedic Surgery) Beverly Gust, MD (Unknown Physician Specialty) Troy Sine, MD as Consulting Physician (Cardiology) Virgel Manifold, MD (Inactive) as Consulting Physician (Gastroenterology)   Medications: Outpatient Medications Prior to Visit  Medication Sig   amLODipine (NORVASC) 5 MG tablet TAKE 1 TABLET (5 MG TOTAL) BY MOUTH DAILY.   aspirin EC 81 MG EC tablet Take 1 tablet (81 mg total) by mouth daily. (Patient taking differently: Take 81 mg by mouth at bedtime.)   atorvastatin (LIPITOR) 80 MG tablet TAKE 1 TABLET BY MOUTH EVERY DAY   clindamycin (CLEOCIN T) 1 % external solution Apply topically daily. Use at effected at back of neck on Monday, Wednesday, and Friday weekly   clopidogrel (PLAVIX) 75 MG tablet TAKE 1 TABLET BY MOUTH EVERY DAY   gabapentin (NEURONTIN) 300 MG capsule gabapentin 300 mg capsule  TAKE 1 CAPSULE BY MOUTH TWICE A DAY   Halobetasol Propionate (LEXETTE) 0.05 % FOAM Apply 1 application topically See admin instructions. Apply to affected areas at back of neck daily on Tuesday, Thursday, Saturdays weekly. Can also apply daily as needed for eczema at left arm see handout.   metoprolol succinate (TOPROL-XL) 50 MG 24 hr tablet Take 1 tablet (50 mg total) by mouth daily. Please schedule an appointment for further refills. 1st attempt.   nitroGLYCERIN (NITROSTAT) 0.4 MG SL tablet Place 1 tablet (0.4 mg total) under the tongue every 5 (five)  minutes as needed for chest pain.   sertraline (ZOLOFT) 100 MG tablet Take 2 tablets (200 mg total) by mouth daily.   tamsulosin (FLOMAX) 0.4 MG CAPS capsule TAKE 1 CAPSULE BY MOUTH EVERY DAY   tiZANidine (ZANAFLEX) 4 MG tablet tizanidine 4 mg tablet  TAKE 1 TABLET BY MOUTH EVERY 6 HOURS AS NEEDED   valsartan (DIOVAN) 320 MG tablet TAKE 1 TABLET BY MOUTH EVERY DAY   No facility-administered medications prior to visit.    Review of Systems  Constitutional:  Positive for fatigue.  HENT:  Positive for ear discharge, ear pain, rhinorrhea, sinus pressure and tinnitus.   Respiratory:  Positive for apnea and shortness of breath.   Cardiovascular:  Positive for  chest pain and leg swelling.  Gastrointestinal:  Positive for abdominal pain and nausea.  Endocrine: Positive for polydipsia.  Musculoskeletal:  Positive for arthralgias, back pain, joint swelling, myalgias, neck pain and neck stiffness.  Neurological:  Positive for numbness.  Psychiatric/Behavioral:  Positive for agitation, confusion, decreased concentration and dysphoric mood. The patient is nervous/anxious.       Objective     BP 133/80 (BP Location: Left Arm, Patient Position: Sitting, Cuff Size: Large)   Pulse 72   Resp 16   Ht '5\' 10"'$  (1.778 m)   Wt 298 lb (135.2 kg)   SpO2 98%   BMI 42.76 kg/m     Physical Exam   General Appearance:    Severely obese male. Alert, cooperative, in no acute distress, appears stated age  Head:    Normocephalic, without obvious abnormality, atraumatic  Eyes:    PERRL, conjunctiva/corneas clear, EOM's intact, fundi    benign, both eyes       Ears:    Normal TM's and external ear canals, both ears  Nose:   Nares normal, septum midline, mucosa normal, no drainage   or sinus tenderness  Throat:   Lips, mucosa, and tongue normal; teeth and gums normal  Neck:   Supple, symmetrical, trachea midline, no adenopathy;       thyroid:  No enlargement/tenderness/nodules; no carotid   bruit or JVD   Back:     Symmetric, no curvature, ROM normal, no CVA tenderness  Lungs:     Clear to auscultation bilaterally, respirations unlabored  Chest wall:    No tenderness or deformity  Heart:    Normal heart rate. Normal rhythm. No murmurs, rubs, or gallops.  S1 and S2 normal  Abdomen:     Soft, non-tender, bowel sounds active all four quadrants,    no masses, no organomegaly  Genitalia:    deferred  Rectal:    deferred  Extremities:   All extremities are intact. No cyanosis. Trace bilateral LE edema.   Pulses:   2+ and symmetric all extremities  Skin:   Skin color, texture, turgor normal, no rashes or lesions  Lymph nodes:   Cervical, supraclavicular, and axillary nodes normal  Neurologic:   CNII-XII intact. Normal strength, sensation and reflexes      throughout     Last depression screening scores    10/30/2021    2:20 PM 10/25/2020   11:25 AM 10/12/2019    9:18 AM  PHQ 2/9 Scores  PHQ - 2 Score '5 6 4  '$ PHQ- 9 Score '16 25 15   '$ Last fall risk screening    10/30/2021    2:20 PM  [place] in the past year? 0  Number falls in past yr: 0  Injury with Fall? 0  Risk for fall due to : No Fall Risks  Follow up Falls evaluation completed   Last Audit-C alcohol use screening    10/30/2021    2:21 PM  Alcohol Use Disorder Test (AUDIT)  1. How often do you have a drink containing alcohol? 1  2. How many drinks containing alcohol do you have on a typical day when you are drinking? 4  3. How often do you have six or more drinks on one occasion? 3  AUDIT-C Score 8   A score of 3 or more in women, and 4 or more in men indicates increased risk for alcohol abuse, EXCEPT if all of the points are from question 1  No results found for any visits on 10/30/21.  Assessment & Plan    Routine Health Maintenance and Physical Exam  Exercise Activities and Dietary recommendations  Goals   None     Immunization History  Administered Date(s) Administered   Influenza,inj,Quad PF,6+ Mos  12/21/2012, 12/02/2014, 04/23/2019, 10/12/2019, 10/25/2020   Influenza-Unspecified 12/19/2017   Td 06/19/2010   Tdap 10/12/2019    Health Maintenance  Topic Date Due   COVID-19 Vaccine (1) Never done   HIV Screening  Never done   Zoster Vaccines- Shingrix (1 of 2) Never done   INFLUENZA VACCINE  09/18/2021   COLONOSCOPY (Pts 45-45yr Insurance coverage will need to be confirmed)  11/24/2023   TETANUS/TDAP  10/11/2029   Hepatitis C Screening  Completed   HPV VACCINES  Aged Out    Discussed health benefits of physical activity, and encouraged him to engage in regular exercise appropriate for his age and condition.  2. Primary hypertension Fairly well controlled. Continue current medications.    3. Coronary artery disease involving native coronary artery of native heart without angina pectoris Asymptomatic. Compliant with medication.  Continue aggressive risk factor modification.   - CBC - Comprehensive metabolic panel - Lipid panel  4. Morbid obesity ([place]  - Semaglutide-Weight Management (WEGOVY) 0.25 MG/0.5ML SOAJ; 0.'25mg'$  once a week x 4. For BMI =42  Dispense: 2 mL; Refill: 0  5. Hyperglycemia  - Hemoglobin A1c  6. Need for influenza vaccination  - Flu Vaccine QUAD 6+ mos PF IM (Fluarix Quad PF)  7. Depression/anxiety He feels sertraline is working well and wishes to continue current dose.   8. Prostate cancer screening  - PSA Total (Reflex To Free) ([facility] only)     The entirety of the information documented in the History of Present Illness, Review of Systems and Physical Exam were personally obtained by me. Portions of this information were initially documented by the CMA and reviewed by me for thoroughness and accuracy.     DLelon Huh MD  [phone](phone) [phone](fax)  [place]

## 2021-10-30 NOTE — Patient Instructions (Addendum)
Please review the attached list of medications and notify my office if there are any errors.   Please go to the lab draw station in [address] on the second floor of [facility]  when you are fasting for 8 hours. Normal hours are 8:00am to 11:30am and 1:00pm to 4:00pm Monday through Friday   Go the [email or web address] to look for copay discount programs for [facility]  Once you get [facility] prescription filled, take 0.'25mg'$  once a week for 4 weeks, then we'll increase the dose to 0.'5mg'$ 

## 2021-10-31 DIAGNOSIS — Z125 Encounter for screening for malignant neoplasm of prostate: Secondary | ICD-10-CM | POA: Diagnosis not present

## 2021-10-31 DIAGNOSIS — I2510 Atherosclerotic heart disease of native coronary artery without angina pectoris: Secondary | ICD-10-CM | POA: Diagnosis not present

## 2021-10-31 DIAGNOSIS — R739 Hyperglycemia, unspecified: Secondary | ICD-10-CM | POA: Diagnosis not present

## 2021-11-01 LAB — CBC
Hematocrit: 45.6 % (ref 37.5–51.0)
Hemoglobin: 15 g/dL (ref 13.0–17.7)
MCH: 29.4 pg (ref 26.6–33.0)
MCHC: 32.9 g/dL (ref 31.5–35.7)
MCV: 89 fL (ref 79–97)
Platelets: 311 10*3/uL (ref 150–450)
RBC: 5.11 x10E6/uL (ref 4.14–5.80)
RDW: 12.2 % (ref 11.6–15.4)
WBC: 8.2 10*3/uL (ref 3.4–10.8)

## 2021-11-01 LAB — COMPREHENSIVE METABOLIC PANEL
ALT: 34 IU/L (ref 0–44)
AST: 24 IU/L (ref 0–40)
Albumin/Globulin Ratio: 1.6 (ref 1.2–2.2)
Albumin: 4.5 g/dL (ref 3.8–4.9)
Alkaline Phosphatase: 91 IU/L (ref 44–121)
BUN/Creatinine Ratio: 12 (ref 9–20)
BUN: 11 mg/dL (ref 6–24)
Bilirubin Total: 0.4 mg/dL (ref 0.0–1.2)
CO2: 21 mmol/L (ref 20–29)
Calcium: 9.7 mg/dL (ref 8.7–10.2)
Chloride: 101 mmol/L (ref 96–106)
Creatinine, Ser: 0.89 mg/dL (ref 0.76–1.27)
Globulin, Total: 2.9 g/dL (ref 1.5–4.5)
Glucose: 102 mg/dL — ABNORMAL HIGH (ref 70–99)
Potassium: 4.5 mmol/L (ref 3.5–5.2)
Sodium: 139 mmol/L (ref 134–144)
Total Protein: 7.4 g/dL (ref 6.0–8.5)
eGFR: 104 mL/min/{1.73_m2} (ref >59)

## 2021-11-01 LAB — HEMOGLOBIN A1C
Est. average glucose Bld gHb Est-mCnc: 123 mg/dL
Hgb A1c MFr Bld: 5.9 % — ABNORMAL HIGH (ref 4.8–5.6)

## 2021-11-01 LAB — LIPID PANEL
Chol/HDL Ratio: 4 ratio (ref 0.0–5.0)
Cholesterol, Total: 128 mg/dL (ref 100–199)
HDL: 32 mg/dL — ABNORMAL LOW (ref >39)
LDL Chol Calc (NIH): 75 mg/dL (ref 0–99)
Triglycerides: 113 mg/dL (ref 0–149)
VLDL Cholesterol Cal: 21 mg/dL (ref 5–40)

## 2021-11-01 LAB — PSA TOTAL (REFLEX TO FREE): Prostate Specific Ag, Serum: 1.5 ng/mL (ref 0.0–4.0)

## 2021-11-02 ENCOUNTER — Ambulatory Visit: Payer: Self-pay

## 2021-11-02 NOTE — Patient Instructions (Signed)
Visit Information  Thank you for taking time to visit with me today. Please don't hesitate to contact me if I can be of assistance to you.   Following are the goals we discussed today:   Goals Addressed             This Visit's Progress    COMPLETED: RNCM: Effective Management of pain       Care Coordination Interventions: Reviewed provider established plan for pain management. The patient limits his activity and knows his limitations. Deals with a lot of chronic pain and needs a knee replacement but has decided not to do this until he absolutely needs to do so. His 1 years old son mows the yard for him so he does not have to deal with the shaking and bouncing up and down. The patient is waiting for approval for [facility] for weight loss. He feels if he can lose some weight this will be helpful for him. Education and support given.  Discussed importance of adherence to all scheduled medical appointments Counseled on the importance of reporting any/all new or changed pain symptoms or management strategies to pain management provider Advised patient to report to care team affect of pain on daily activities Discussed use of relaxation techniques and/or diversional activities to assist with pain reduction (distraction, imagery, relaxation, massage, acupressure, TENS, heat, and cold application Reviewed with patient prescribed pharmacological and nonpharmacological pain relief strategies Advised patient to discuss unresolved pain, changes in level of intensity of pain  with provider Screening for signs and symptoms of depression related to chronic disease state  Assessed social determinant of health barriers Review of the role of the [facility] and the care coordination program. The patient instructed on how to get in touch with the [facility] for any new questions, concerns, or educational needs             Please call the care guide team at [phone] if you need to schedule an appointment.   If  you are experiencing a Mental Health or [place] or need someone to talk to, please call the Suicide and Crisis Lifeline: 988 call the [place] National Suicide Prevention Lifeline: 440-745-4194 or TTY: (347) 097-5286 TTY 450-198-0602) to talk to a trained counselor call 1-800-273-TALK (toll free, 24 hour hotline)  Patient verbalizes understanding of instructions and care plan provided today and agrees to view in [place]. Active MyChart status and patient understanding of how to access instructions and care plan via MyChart confirmed with patient.     No further follow up required: the patient is stable with his chronic conditions and knows to call for changes or needs.  Noreene Larsson RN, MSN, CCM Community Care Coordinator [facility] Mobile: [phone]

## 2021-11-02 NOTE — Patient Outreach (Signed)
  Care Coordination   Initial Visit Note   11/02/2021 Name: Eduardo Poole MRN: [id number] DOB: [birth year]  Eduardo Poole is a 51 y.o. year old male who sees Fisher, Kirstie Peri, MD for primary care. I spoke with  Eduardo Poole by phone today.  What matters to the patients health and wellness today?  Managing pain and losing weight that will hopefully help with pain management also    Goals Addressed             This Visit's Progress    RNCM: Effective Management of pain       Care Coordination Interventions: Reviewed provider established plan for pain management. The patient limits his activity and knows his limitations. Deals with a lot of chronic pain and needs a knee replacement but has decided not to do this until he absolutely needs to do so. His 3 years old son mows the yard for him so he does not have to deal with the shaking and bouncing up and down. The patient is waiting for approval for [facility] for weight loss. He feels if he can lose some weight this will be helpful for him. Education and support given.  Discussed importance of adherence to all scheduled medical appointments Counseled on the importance of reporting any/all new or changed pain symptoms or management strategies to pain management provider Advised patient to report to care team affect of pain on daily activities Discussed use of relaxation techniques and/or diversional activities to assist with pain reduction (distraction, imagery, relaxation, massage, acupressure, TENS, heat, and cold application Reviewed with patient prescribed pharmacological and nonpharmacological pain relief strategies Advised patient to discuss unresolved pain, changes in level of intensity of pain  with provider Screening for signs and symptoms of depression related to chronic disease state  Assessed social determinant of health barriers Review of the role of the RNCM and the care coordination program. The patient instructed on how to  get in touch with the [facility] for any new questions, concerns, or educational needs           SDOH assessments and interventions completed:  Yes  SDOH Interventions Today    Flowsheet Row Most Recent Value  SDOH Interventions   Food Insecurity Interventions Intervention Not Indicated  Housing Interventions Intervention Not Indicated  Transportation Interventions Intervention Not Indicated  Utilities Interventions Intervention Not Indicated  Financial Strain Interventions Intervention Not Indicated  Physical Activity Interventions Other (Comments)  [unable to do a lot of activity due to chronic pain and discomfort]        Care Coordination Interventions Activated:  Yes  Care Coordination Interventions:  Yes, provided   Follow up plan: No further intervention required.   Encounter Outcome:  Pt. Visit Completed   Eduardo Larsson RN, MSN, Eduardo Poole Mobile: [phone]

## 2021-11-22 ENCOUNTER — Other Ambulatory Visit: Payer: Self-pay | Admitting: Family Medicine

## 2021-11-22 ENCOUNTER — Other Ambulatory Visit: Payer: Self-pay | Admitting: Cardiovascular Disease

## 2021-11-22 DIAGNOSIS — F419 Anxiety disorder, unspecified: Secondary | ICD-10-CM

## 2021-11-22 NOTE — Telephone Encounter (Signed)
Requested Prescriptions  Pending Prescriptions Disp Refills  . sertraline (ZOLOFT) 100 MG tablet [Pharmacy Med Name: SERTRALINE HCL 100 MG TABLET] 180 tablet 0    Sig: TAKE 2 TABLETS BY MOUTH EVERY DAY     Psychiatry:  Antidepressants - SSRI - sertraline Passed - 11/22/2021  2:45 AM      Passed - AST in normal range and within 360 days    AST  Date Value Ref Range Status  10/31/2021 24 0 - 40 IU/L Final   SGOT(AST)  Date Value Ref Range Status  04/26/2013 48 (H) 15 - 37 Unit/L Final         Passed - ALT in normal range and within 360 days    ALT  Date Value Ref Range Status  10/31/2021 34 0 - 44 IU/L Final   SGPT (ALT)  Date Value Ref Range Status  04/26/2013 66 12 - 78 U/L Final         Passed - Completed PHQ-2 or PHQ-9 in the last 360 days      Passed - Valid encounter within last 6 months    Recent Outpatient Visits          3 weeks ago Annual physical exam   [facility] Birdie Sons, MD   4 months ago Diverticulitis   [facility] Birdie Sons, MD   1 year ago Right inguinal hernia   [facility] Birdie Sons, MD   1 year ago Annual physical exam   [facility] Birdie Sons, MD   1 year ago Subacute pansinusitis   [place], PA-C      Future Appointments            In 3 months Stoioff, Ronda Fairly, MD [place]   In 3 months Ralene Bathe, MD [place]

## 2021-11-26 ENCOUNTER — Telehealth: Payer: BC Managed Care – PPO | Admitting: Emergency Medicine

## 2021-11-26 ENCOUNTER — Ambulatory Visit: Payer: Self-pay | Admitting: *Deleted

## 2021-11-26 DIAGNOSIS — U071 COVID-19: Secondary | ICD-10-CM

## 2021-11-26 MED ORDER — BENZONATATE 100 MG PO CAPS: 100.0000 mg | ORAL_CAPSULE | Freq: Two times a day (BID) | ORAL | 0 refills | Status: DC | PRN

## 2021-11-26 MED ORDER — MOLNUPIRAVIR EUA 200MG CAPSULE: 4.0000 | ORAL_CAPSULE | Freq: Two times a day (BID) | ORAL | 0 refills | Status: AC

## 2021-11-26 NOTE — Telephone Encounter (Signed)
Summary: covid positive   Pt tested positive for covid Friday night. Wife states pt has some underlying health issues which are concerning to them both. Had fever Friday night.  Having hot flashes and chills since then. Wife would like call back to advise on what pt needs to do.      Chief Complaint: cough, high risk, yellow green phlegm  Symptoms: cough, hot flashes, sweating, joint pain, lower back pain Frequency: Friday Pertinent Negatives: Patient denies SOB or dehydration Disposition: '[]'$ ED /'[]'$ Urgent Care (no appt availability in office) / '[]'$ Appointment(In office/virtual)/ '[x]'$  [place] [facility]/ '[]'$ Home Care/ '[]'$ Refused Recommended Disposition /'[]'$ [place] [facility]/ '[]'$  Follow-up with PCP Additional Notes: Virtual appt made Appt available tomorrow in office pt wants to be seen today Reason for Disposition  [1] HIGH RISK patient (e.g., weak immune system, age > 49 years, obesity with BMI 30 or higher, pregnant, chronic lung disease or other chronic medical condition) AND [2] COVID symptoms (e.g., cough, fever)  (Exceptions: Already seen by PCP and no new or worsening symptoms.)  Answer Assessment - Initial Assessment Questions 1. COVID-19 DIAGNOSIS: "How do you know that you have COVID?" (e.g., positive lab test or self-test, diagnosed by doctor or NP/PA, symptoms after exposure).     Self test  2. COVID-19 EXPOSURE: "Was there any known exposure to COVID before the symptoms began?" CDC Definition of close contact: within 6 feet (2 meters) for a total of 15 minutes or more over a 24-hour period.      no 3. ONSET: "When did the COVID-19 symptoms start?"      Sx started Friday 4. WORST SYMPTOM: "What is your worst symptom?" (e.g., cough, fever, shortness of breath, muscle aches)     Muscle aches and joint pain, hot and cold flashes 5. COUGH: "Do you have a cough?" If Yes, ask: "How bad is the cough?"       Yes-frequent greenish yellow- 6. FEVER: "Do you have a fever?" If Yes, ask:  "What is your temperature, how was it measured, and when did it start?"     no 7. RESPIRATORY STATUS: "Describe your breathing?" (e.g., normal; shortness of breath, wheezing, unable to speak)      normal 8. BETTER-SAME-WORSE: "Are you getting better, staying the same or getting worse compared to yesterday?"  If getting worse, ask, "In what way?"     same 9. OTHER SYMPTOMS: "Do you have any other symptoms?"  (e.g., chills, fatigue, headache, loss of smell or taste, muscle pain, sore throat)     Hot flashes, sweating, joint pain, lower back pain 10. HIGH RISK DISEASE: "Do you have any chronic medical problems?" (e.g., asthma, heart or lung disease, weak immune system, obesity, etc.)       Heart CVA obesity 11. VACCINE: "Have you had the COVID-19 vaccine?" If Yes, ask: "Which one, how many shots, when did you get it?"       2 yes 12. PREGNANCY: "Is there any chance you are pregnant?" "When was your last menstrual period?"       N/a 13. O2 SATURATION MONITOR:  "Do you use an oxygen saturation monitor (pulse oximeter) at home?" If Yes, ask "What is your reading (oxygen level) today?" "What is your usual oxygen saturation reading?" (e.g., 95%)       N/a  Protocols used: Coronavirus (COVID-19) Diagnosed or Suspected-A-AH

## 2021-11-26 NOTE — Progress Notes (Signed)
Virtual Visit Consent   SEVERO BEBER, you are scheduled for a virtual visit with a [facility] provider today. Just as with appointments in the office, your consent must be obtained to participate. Your consent will be active for this visit and any virtual visit you may have with one of our providers in the next 365 days. If you have a MyChart account, a copy of this consent can be sent to you electronically.  As this is a virtual visit, video technology does not allow for your provider to perform a traditional examination. This may limit your provider's ability to fully assess your condition. If your provider identifies any concerns that need to be evaluated in person or the need to arrange testing (such as labs, EKG, etc.), we will make arrangements to do so. Although advances in technology are sophisticated, we cannot ensure that it will always work on either your end or our end. If the connection with a video visit is poor, the visit may have to be switched to a telephone visit. With either a video or telephone visit, we are not always able to ensure that we have a secure connection.  By engaging in this virtual visit, you consent to the provision of healthcare and authorize for your insurance to be billed (if applicable) for the services provided during this visit. Depending on your insurance coverage, you may receive a charge related to this service.  I need to obtain your verbal consent now. Are you willing to proceed with your visit today? Eduardo Poole has provided verbal consent on 11/26/2021 for a virtual visit (video or telephone). Montine Circle, PA-C  Date: 11/26/2021 10:57 AM  Virtual Visit via Video Note   I, Montine Circle, connected with  Eduardo Poole  ([id number], 04-17-1970) on 11/26/21 at 11:00 AM EDT by a video-enabled telemedicine application and verified that I am speaking with the correct person using two identifiers.  Location: Patient: Virtual Visit Location  Patient: Home Provider: Virtual Visit Location Provider: Home   I discussed the limitations of evaluation and management by telemedicine and the availability of in person appointments. The patient expressed understanding and agreed to proceed.    History of Present Illness: Eduardo Poole is a 51 y.o. who identifies as a male who was assigned male at birth, and is being seen today for COVID.  States that symptoms started Friday (3 days ago).  Reports having some cough, body aches, subjective fevers, and diarrhea.  Has been taking Tylenol and cough medication. Denies any vomiting.  States that his diarrhea is tapering off, but the cough is worsening.  HPI: HPI  Problems:  Patient Active Problem List   Diagnosis Date Noted   Liver lesion 07/10/2021   Diverticulitis 07/10/2021   History of colonic polyps    Polyp of colon    Otalgia 02/13/2020   Wheezing 02/13/2020   Morbid obesity due to excess calories ([place]) 10/05/2018   Left knee pain 04/09/2017   Myalgia and myositis 04/09/2017   OSA on CPAP 01/18/2016   Hyperlipidemia    CAD (coronary artery disease)    Hypertensive heart disease without heart failure    DDD (degenerative disc disease), cervical 09/19/2015   DDD (degenerative disc disease), lumbar 09/19/2015   Frequent PVCs 12/02/2014   History of CVA in adulthood 10/20/2014   Tremor 10/04/2014   History of TIA (transient ischemic attack) 82/42/3536   Diastolic dysfunction 14/43/1540   Anxiety 08/02/2014   Depression 08/02/2014   Diplopia  08/02/2014   Fatigue 08/02/2014   H/O adenomatous polyp of colon 08/02/2014   History of DVT (deep vein thrombosis) 08/02/2014   Hypertension 08/02/2014   Insomnia 08/02/2014   Personal history of other venous thrombosis and embolism 08/02/2014   Breathlessness on exertion 03/24/2014   History of knee surgery 07/18/2011   Idiopathic localized osteoarthropathy 06/14/2011   Pain in joint involving lower leg 06/14/2011    Allergies: No  Known Allergies Medications:  Current Outpatient Medications:    amLODipine (NORVASC) 5 MG tablet, TAKE 1 TABLET (5 MG TOTAL) BY MOUTH DAILY., Disp: 90 tablet, Rfl: 3   aspirin EC 81 MG EC tablet, Take 1 tablet (81 mg total) by mouth daily. (Patient taking differently: Take 81 mg by mouth at bedtime.), Disp: , Rfl:    atorvastatin (LIPITOR) 80 MG tablet, Take 1 tablet (80 mg total) by mouth daily. Patient need to schedule appointment for future refills first attempt, Disp: 30 tablet, Rfl: 0   clindamycin (CLEOCIN T) 1 % external solution, Apply topically daily. Use at effected at back of neck on Monday, Wednesday, and Friday weekly, Disp: 30 mL, Rfl: 11   clopidogrel (PLAVIX) 75 MG tablet, TAKE 1 TABLET BY MOUTH EVERY DAY, Disp: 90 tablet, Rfl: 2   gabapentin (NEURONTIN) 300 MG capsule, gabapentin 300 mg capsule  TAKE 1 CAPSULE BY MOUTH TWICE A DAY, Disp: , Rfl:    Halobetasol Propionate (LEXETTE) 0.05 % FOAM, Apply 1 application topically See admin instructions. Apply to affected areas at back of neck daily on Tuesday, Thursday, Saturdays weekly. Can also apply daily as needed for eczema at left arm see handout., Disp: 50 g, Rfl: 6   metoprolol succinate (TOPROL-XL) 50 MG 24 hr tablet, Take 1 tablet (50 mg total) by mouth daily. Please schedule an appointment for further refills. 1st attempt., Disp: 135 tablet, Rfl: 0   nitroGLYCERIN (NITROSTAT) 0.4 MG SL tablet, Place 1 tablet (0.4 mg total) under the tongue every 5 (five) minutes as needed for chest pain., Disp: 25 tablet, Rfl: 12   Semaglutide-Weight Management (WEGOVY) 0.25 MG/0.5ML SOAJ, 0.'25mg'$  once a week x 4. For BMI =42, Disp: 2 mL, Rfl: 0   sertraline (ZOLOFT) 100 MG tablet, TAKE 2 TABLETS BY MOUTH EVERY DAY, Disp: 180 tablet, Rfl: 0   tamsulosin (FLOMAX) 0.4 MG CAPS capsule, TAKE 1 CAPSULE BY MOUTH EVERY DAY, Disp: 90 capsule, Rfl: 1   tiZANidine (ZANAFLEX) 4 MG tablet, tizanidine 4 mg tablet  TAKE 1 TABLET BY MOUTH EVERY 6 HOURS AS NEEDED,  Disp: , Rfl:    valsartan (DIOVAN) 320 MG tablet, TAKE 1 TABLET BY MOUTH EVERY DAY, Disp: 90 tablet, Rfl: 0  Observations/Objective: Patient is well-developed, well-nourished in no acute distress.  Resting comfortably at home.  Head is normocephalic, atraumatic.  No labored breathing.  Speech is clear and coherent with logical content.  Patient is alert and oriented at baseline.    Assessment and Plan: 1. COVID-19  - Molnupiravir - Tessalon for cough - Tylenol for fever and body aches - continue supportive care at home  Follow Up Instructions: I discussed the assessment and treatment plan with the patient. The patient was provided an opportunity to ask questions and all were answered. The patient agreed with the plan and demonstrated an understanding of the instructions.  A copy of instructions were sent to the patient via MyChart unless otherwise noted below.    The patient was advised to call back or seek an in-person evaluation if the symptoms  worsen or if the condition fails to improve as anticipated.  Time:  I spent 11 minutes with the patient via telehealth technology discussing the above problems/concerns.    Montine Circle, PA-C

## 2021-11-26 NOTE — Patient Instructions (Signed)
     Please continue isolation at home, for at least 10 days since the start of your symptoms and until you have had 24 hours with no fever (without taking a fever reducer) and with improving of symptoms.  If you have no symptoms but tested positive (or all symptoms resolve after 5 days and you have no fever) you can leave your house but continue to wear a mask around others for an additional 5 days. If you have a fever,continue to stay home until you have had 24 hours of no fever. Most cases improve 5-10 days from onset but we have seen a small number of patients who have gotten worse after the 10 days.  Please be sure to watch for worsening symptoms and remain taking the proper precautions.   Go to the nearest hospital ED for assessment if fever/cough/breathlessness are severe or illness seems like a threat to life.    The following symptoms may appear 2-14 days after exposure: Fever Cough Shortness of breath or difficulty breathing Chills Repeated shaking with chills Muscle pain Headache Sore throat New loss of taste or smell Fatigue Congestion or runny nose Nausea or vomiting Diarrhea  You may also take acetaminophen (Tylenol) as needed for fever.  HOME CARE: Only take medications as instructed by your medical team. Drink plenty of fluids and get plenty of rest. A steam or ultrasonic humidifier can help if you have congestion.   GET HELP RIGHT AWAY IF YOU HAVE EMERGENCY WARNING SIGNS.  Call 911 or proceed to your closest emergency facility if: You develop worsening high fever. Trouble breathing Bluish lips or face Persistent pain or pressure in the chest New confusion Inability to wake or stay awake You cough up blood. Your symptoms become more severe Inability to hold down food or fluids  This list is not all possible symptoms. Contact your medical provider for any symptoms that are severe or concerning to you.

## 2021-11-26 NOTE — Telephone Encounter (Signed)
Message left to call back and say returning a nurses call. If we do not here back from her we will try her again.

## 2021-12-06 ENCOUNTER — Other Ambulatory Visit: Payer: Self-pay | Admitting: Cardiovascular Disease

## 2021-12-10 ENCOUNTER — Other Ambulatory Visit: Payer: Self-pay | Admitting: Cardiovascular Disease

## 2021-12-10 ENCOUNTER — Other Ambulatory Visit: Payer: Self-pay | Admitting: Urology

## 2021-12-14 ENCOUNTER — Emergency Department: Payer: BC Managed Care – PPO

## 2021-12-14 ENCOUNTER — Emergency Department
Admission: EM | Admit: 2021-12-14 | Discharge: 2021-12-14 | Disposition: A | Discharge status: Home / Self Care | Payer: BC Managed Care – PPO | Attending: Emergency Medicine | Admitting: Emergency Medicine

## 2021-12-14 ENCOUNTER — Other Ambulatory Visit: Payer: Self-pay

## 2021-12-14 ENCOUNTER — Telehealth: Payer: Self-pay | Admitting: Cardiovascular Disease

## 2021-12-14 DIAGNOSIS — R002 Palpitations: Secondary | ICD-10-CM | POA: Diagnosis not present

## 2021-12-14 DIAGNOSIS — R079 Chest pain, unspecified: Secondary | ICD-10-CM | POA: Diagnosis not present

## 2021-12-14 DIAGNOSIS — R0789 Other chest pain: Secondary | ICD-10-CM | POA: Diagnosis not present

## 2021-12-14 LAB — CBC
HCT: 44.9 % (ref 39.0–52.0)
Hemoglobin: 14.9 g/dL (ref 13.0–17.0)
MCH: 29.4 pg (ref 26.0–34.0)
MCHC: 33.2 g/dL (ref 30.0–36.0)
MCV: 88.7 fL (ref 80.0–100.0)
Platelets: 304 10*3/uL (ref 150–400)
RBC: 5.06 MIL/uL (ref 4.22–5.81)
RDW: 12.8 % (ref 11.5–15.5)
WBC: 7.4 10*3/uL (ref 4.0–10.5)
nRBC: 0 % (ref 0.0–0.2)

## 2021-12-14 LAB — TROPONIN I (HIGH SENSITIVITY)
Troponin I (High Sensitivity): 13 ng/L (ref <18)
Troponin I (High Sensitivity): 12 ng/L (ref <18)

## 2021-12-14 LAB — BASIC METABOLIC PANEL
Anion gap: 8 (ref 5–15)
BUN: 16 mg/dL (ref 6–20)
CO2: 24 mmol/L (ref 22–32)
Calcium: 9.5 mg/dL (ref 8.9–10.3)
Chloride: 107 mmol/L (ref 98–111)
Creatinine, Ser: 0.88 mg/dL (ref 0.61–1.24)
GFR, Estimated: >60 mL/min (ref >60)
Glucose, Bld: 111 mg/dL — ABNORMAL HIGH (ref 70–99)
Potassium: 3.9 mmol/L (ref 3.5–5.1)
Sodium: 139 mmol/L (ref 135–145)

## 2021-12-14 MED ORDER — METOPROLOL SUCCINATE ER 50 MG PO TB24: 75.0000 mg | ORAL_TABLET | Freq: Every day | ORAL | 3 refills | Status: DC

## 2021-12-14 MED ORDER — ASPIRIN 81 MG PO CHEW
324.0000 mg | CHEWABLE_TABLET | Freq: Once | ORAL | Status: AC
  Administered 2021-12-14: 324 mg via ORAL
  Filled 2021-12-14: qty 4

## 2021-12-14 MED ORDER — NITROGLYCERIN 0.4 MG SL SUBL: SUBLINGUAL_TABLET | SUBLINGUAL | 12 refills | Status: DC

## 2021-12-14 NOTE — ED Provider Notes (Addendum)
[facility] Provider Note    Event Date/Time   First MD Initiated Contact with Patient 12/14/21 0935     (approximate)   History   Palpitations   HPI  Eduardo Poole is a 51 y.o. male who reports some palpitations starting yesterday.  He says his heart feels like it is beating fast.  He says he has some feeling of shortness of breath and chest tightness.  It lasted few minutes and goes away.  Heart racing seems to do the same thing.  Heart racing makes the chest tightness worse.  Patient reports it feels something like when he had his heart attack with a heart attack was very much worse.  More severe. Patient also has a past history of blood clots.     Physical Exam   Triage Vital Signs: ED Triage Vitals  Enc Vitals Group     BP 12/14/21 0929 (!) 145/94     Pulse Rate 12/14/21 0929 65     Resp 12/14/21 0929 20     Temp 12/14/21 0929 98.3 F (36.8 C)     Temp Source 12/14/21 0929 Oral     SpO2 12/14/21 0929 96 %     Weight 12/14/21 0923 292 lb (132.5 kg)     Height 12/14/21 0923 '5\' 11"'$  (1.803 m)     Head Circumference --      Peak Flow --      Pain Score 12/14/21 0923 4     Pain Loc --      Pain Edu? --      Excl. in [place]? --     Most recent vital signs: Vitals:   12/14/21 1300 12/14/21 1327  BP: 137/81   Pulse: (!) 59   Resp: (!) 23   Temp:  98.3 F (36.8 C)  SpO2: 96%     General: Awake, no distress.  CV:  Good peripheral perfusion.  Heart regular rate and rhythm no audible murmurs Resp:  Normal effort.  Lungs are clear Abd:  No distention.  Soft and nontender 1/2+ swelling of the right leg patient reports this is old due to blood clots in the past   ED Results / Procedures / Treatments   Labs (all labs ordered are listed, but only abnormal results are displayed) Labs Reviewed  BASIC METABOLIC PANEL - Abnormal; Notable for the following components:      Result Value   Glucose, Bld 111 (*)    All other components within  normal limits  CBC  TROPONIN I (HIGH SENSITIVITY)  TROPONIN I (HIGH SENSITIVITY)     EKG  EKG read interpreted by me shows normal sinus rhythm rate of 65 normal axis patient has 1 PVC on the EKG.  Normal sinus beats show no acute ST-T changes somewhat decreased R wave progression. Monitor strip shows some PVCs and also some what appear to be PACs.  RADIOLOGY Chest x-ray read by radiology reviewed and interpreted by me shows no acute disease  PROCEDURES:  Critical Care performed:   Procedures   MEDICATIONS ORDERED IN ED: Medications  aspirin chewable tablet 324 mg (324 mg Oral Given 12/14/21 1002)     IMPRESSION / MDM / ASSESSMENT AND PLAN / ED COURSE  I reviewed the triage vital signs and the nursing notes.  Patient has not been tachycardic or tachypneic.  His EKG does show occasional PVCs and premature atrial or junctional beats are seen on the monitor but these are very infrequent.  His  heart rates almost always in the 60s are sometimes in the 50s.  Patient has not had any pleuritic discomfort at all.  Chest tightness only lasts for few minutes.  Patient has had repeated visits for this and while he had 1 angioplasty repeat cath had not shown any further disease.  Patient himself asked if this could be due to anxiety as it has been in the past.  I think this is most likely the case. Differential diagnosis includes, but is not limited to, angina is a possibility esophageal spasm is a possibility although I think both of these are unlikely.  Chest pain due to anxiety is also a possibility.  I will have the patient follow-up with cardiology in the next few days.  I put in both a consult and given the patient the on-call cardiologist's name and phone number and asked him to call the on-call cardiologist if he has not heard back from the consult service with an appointment after 2 days.  Patient's presentation is most consistent with acute complicated illness / injury requiring  diagnostic workup.  The patient is on the cardiac monitor to evaluate for evidence of arrhythmia and/or significant heart rate changes.  Only some premature contractions are seen.    FINAL CLINICAL IMPRESSION(S) / ED DIAGNOSES   Final diagnoses:  Palpitations  Chest pain, unspecified type     Rx / DC Orders   ED Discharge Orders          Ordered    Ambulatory referral to Cardiology       Comments: If you have not heard from the Cardiology office within the next 72 hours please call [phone].   12/14/21 1248             Note:  This document was prepared using Dragon voice recognition software and may include unintentional dictation errors.   Nena Polio, MD 12/14/21 1606 Showed that the patient has had no change in his baseline leg swelling and no pain in his legs.  I do not believe he is having PEs.   Nena Polio, MD 12/14/21 [id number]

## 2021-12-14 NOTE — ED Triage Notes (Signed)
Pt arrives with c/o palpitations that started yesterday afternoon. Pt endorses SOB and CP. Pt has hx of MI.

## 2021-12-14 NOTE — Telephone Encounter (Signed)
Patient reports that yesterday, around 2 pm, his heart started racing. It eased when lying down, but with activity, started racing again. This AM BP 165/106, P 76. While on phone 151/104. Currently has chest tightness. His NTG is a year old. Recommended he be taken to the ED. He said he would have someone take him. Correction made on med list for metoprolol succinate '75mg'$  daily, new refill for NTG sent as well.

## 2021-12-14 NOTE — Discharge Instructions (Addendum)
Your EKG looked good.  The monitor showed some premature ventricular contractions and other irregular beats occasionally but no rapid heart rate.  Your troponins were negative.  I think this may be related to anxiety.  I do not see any sign of an acute heart attack.  I want you to follow-up with cardiology as soon as possible.  I have put a cardiology consult in.  If they do not call you back within 2 days please give Dr. Rockey Situ a call.  I have included his callback information with this discharge paperwork.  He is on-call for [place] today.

## 2021-12-14 NOTE — Telephone Encounter (Signed)
Patient c/o Palpitations:  High priority if patient c/o lightheadedness, shortness of breath, or chest pain  How long have you had palpitations/irregular HR/ Afib? Are you having the symptoms now?  Heart racing at this time, it started yesterday   Are you currently experiencing lightheadedness, SOB or CP?  A little chest pain , but not at this time  Do you have a history of afib (atrial fibrillation) or irregular heart rhythm?   Have you checked your BP or HR? (document readings if available): blood pressure is high, this morning it is 165/106, last night 176/108  Are you experiencing any other symptoms?

## 2021-12-15 ENCOUNTER — Other Ambulatory Visit: Payer: Self-pay | Admitting: Cardiovascular Disease

## 2021-12-16 NOTE — Progress Notes (Unsigned)
Cardiology Clinic Note   Patient Name: Eduardo Poole Date of Encounter: 12/17/2021  Primary Care Provider:  Birdie Sons, MD Primary Cardiologist:  Shelva Majestic, MD  Patient Profile    Eduardo Poole 51 year old male presents to the clinic today for follow-up evaluation of his hypertension, coronary artery disease and PVCs.  Past Medical History    Past Medical History:  Diagnosis Date   CAD (coronary artery disease)    a. 07/2014 Inf STEMI/PTCA:  LM nl, LAD nl, LCX nl, RCA ulcerated plaque (PTCA only);  b. 10/2014 MV: intermediate risk, ? inflat isch/scar;  c. 10/2014 Cath: Nl cors w/o RCA restenosis.   Diastolic dysfunction    a. 07/2014 Echo: EF 60-65%, Gr1 DD.   DVT of lower extremity (deep venous thrombosis) ([place])    a. ~ 2009, per pt was on lovenox/coumadin   GERD (gastroesophageal reflux disease)    History of dysplastic nevus 12/07/2018   right post waistline/moderate, left low back/moderate, left mid back lateral near side/moderate    Hyperlipidemia    Hypertension    Hypertensive heart disease    ST elevation myocardial infarction (STEMI) involving right coronary artery with complication ([place]) 03/21/6242   TIA (transient ischemic attack) 07/23/2014   Past Surgical History:  Procedure Laterality Date   ARTHROPLASTY Right 07/09/2010   CARDIAC CATHETERIZATION N/A 07/23/2014   Procedure: Left Heart Cath;  Surgeon: Troy Sine, MD;  Location: [place];  Service: Cardiovascular;  Laterality: N/A;   CARDIAC CATHETERIZATION N/A 07/23/2014   Procedure: Coronary Balloon Angioplasty;  Surgeon: Troy Sine, MD;  Location: [facility] [facility];  Service: Cardiovascular;  Laterality: N/A;   CARDIAC CATHETERIZATION N/A 11/10/2014   Procedure: Left Heart Cath and Coronary Angiography;  Surgeon: Troy Sine, MD;  Location: [facility];  Service: Cardiovascular;  Laterality: N/A;   CERVICAL DISC SURGERY  2015   COLONOSCOPY     COLONOSCOPY      COLONOSCOPY WITH PROPOFOL N/A 11/23/2020   Procedure: COLONOSCOPY WITH PROPOFOL;  Surgeon: Virgel Manifold, MD;  Location: [facility];  Service: Endoscopy;  Laterality: N/A;   JOINT REPLACEMENT Right    Right knee   MENISCUS REPAIR Right 11/03/1995   Meniscus Lateral Tear: repaired by Dr. Sabra Heck at [facility]   REPLACEMENT TOTAL KNEE Right 2011   [facility]    Allergies  No Known Allergies  History of Present Illness    ATA PECHA coronary artery disease with status post STEMI 6/16, (normal left main, normal LAD, normal circumflex, ulcerated plaque RCA, PTCA only, repeat catheterization 9/16 with normal coronaries without RCA restenosis.,  Diastolic dysfunction, DVT 2009, GERD, HLD, HTN, TIA.  He was seen by Dr. Claiborne Billings on 09/11/2020.  During that time he reported monitoring his blood pressure regularly.  His blood pressure was ranging in the 695-072 systolic range.  He reported that prior to his visit he had slipped on bleachers while at his son's baseball game.  Since that time he had noted bilateral knee discomfort.  He is status post prior knee surgeries.  He reported a 10 pound weight gain due to not being as physically active.  He denied chest pain.  He was unaware of palpitations.  He was compliant with his CPAP.  His DVT resolved and he was taken off anticoagulation.  Target LDL less than 70.  He was seen in the  emergency department on 12/14/2021.  He reported palpitations starting the previous day.  He indicated that it felt like his heart was beating fast.  He reported shortness of breath and chest tightness.  His symptoms lasted few minutes and then go away.  He reported similar symptoms to when he had his heart attack.  He felt these were more severe.  His BMP and CBC were unremarkable.  His high-sensitivity troponins were 13 and 12.  His chest x-ray showed no acute changes.  His EKG showed normal sinus  rhythm with PVCs and PACs.  He denied pleuritic discomfort.  His cardiac catheterization x2 in 2016 were reviewed.  Patient felt like his symptoms may be related to anxiety.  It was felt that that was the most likely case.  It was also felt that differential could be esophageal spasm however was felt to be unlikely.  Outpatient follow-up with cardiology was planned.  He presents to the clinic today for follow-up evaluation states he had another episode of chest discomfort yesterday.  He noticed this discomfort with waking up in the morning and to continue until about midday until it resolved.  He reports that he had COVID about 4 weeks ago.  He has been increasing his physical activity with regular exercise prior to COVID infection discontinued exercise after his infection.  His main symptom with this was GI upset.  He maintained hydration throughout his acute illness.  He followed up with his PCP prescribed medication to help with his frequent diarrhea..  We reviewed his recent ED visit.  Recently his blood pressure is slightly elevated.  He expressed understanding.  We also reviewed his cardiac catheterizations from 2006 and his lipid panel.  His chest discomfort appears to be atypical in nature.  I will order an echocardiogram, give month of stress reduction sheet, increase his metoprolol to 100 mg daily plan follow-up in 2 to 3 months.  Today he denies chest pain, shortness of breath, lower extremity edema, fatigue, palpitations, melena, hematuria, hemoptysis, diaphoresis, weakness, presyncope, syncope, orthopnea, and PND.     Home Medications    Prior to Admission medications   Medication Sig Start Date End Date Taking? Authorizing Provider  amLODipine (NORVASC) 5 MG tablet TAKE 1 TABLET (5 MG TOTAL) BY MOUTH DAILY. 06/25/21   Troy Sine, MD  aspirin EC 81 MG EC tablet Take 1 tablet (81 mg total) by mouth daily. Patient taking differently: Take 81 mg by mouth at bedtime. 07/26/14   Brett Canales, PA-C  atorvastatin (LIPITOR) 80 MG tablet Take 1 tablet (80 mg total) by mouth daily. Patient need to schedule appointment for future refills first attempt 11/22/21   Troy Sine, MD  benzonatate (TESSALON) 100 MG capsule Take 1 capsule (100 mg total) by mouth 2 (two) times daily as needed for cough. 11/26/21   Montine Circle, PA-C  clindamycin (CLEOCIN T) 1 % external solution Apply topically daily. Use at effected at back of neck on Monday, Wednesday, and Friday weekly 03/08/21 03/08/22  Ralene Bathe, MD  clopidogrel (PLAVIX) 75 MG tablet TAKE 1 TABLET BY MOUTH EVERY DAY 03/26/21   Troy Sine, MD  gabapentin (NEURONTIN) 300 MG capsule gabapentin 300 mg capsule  TAKE 1 CAPSULE BY MOUTH TWICE A DAY    [provider]  Halobetasol Propionate (LEXETTE) 0.05 % FOAM Apply 1 application topically See admin instructions. Apply to affected areas at back of neck daily on Tuesday, Thursday, Saturdays weekly. Can also apply daily as  needed for eczema at left arm see handout. 03/08/21   Ralene Bathe, MD  metoprolol succinate (TOPROL-XL) 50 MG 24 hr tablet Take 1.5 tablets (75 mg total) by mouth daily. Take with or immediately following a meal. 12/14/21   Troy Sine, MD  nitroGLYCERIN (NITROSTAT) 0.4 MG SL tablet For chest pain, tightness, or pressure. While sitting, place 1 tablet under tongue. May be used every 5 minutes as needed, for up to 15 minutes. Do not use more than 3 tablets. 12/14/21   Troy Sine, MD  Semaglutide-Weight Management (WEGOVY) 0.25 MG/0.5ML SOAJ 0.'25mg'$  once a week x 4. For BMI =42 10/30/21   Birdie Sons, MD  sertraline (ZOLOFT) 100 MG tablet TAKE 2 TABLETS BY MOUTH EVERY DAY 11/22/21   Birdie Sons, MD  tamsulosin (FLOMAX) 0.4 MG CAPS capsule TAKE 1 CAPSULE BY MOUTH EVERY DAY 12/10/21   Stoioff, Ronda Fairly, MD  tiZANidine (ZANAFLEX) 4 MG tablet tizanidine 4 mg tablet  TAKE 1 TABLET BY MOUTH EVERY 6 HOURS AS NEEDED    [provider]   valsartan (DIOVAN) 320 MG tablet Take 1 tablet (320 mg total) by mouth daily. NEED OV. 12/10/21   Troy Sine, MD    Family History    Family History  Problem Relation Age of Onset   Hypertension Mother    Arthritis Mother    Diverticulitis Mother    Gout Father    Autoimmune disease Father    Arthritis Sister    Diabetes Paternal Grandfather    Diabetes Maternal Grandfather    Cancer Maternal Grandfather        unknown   He indicated that his mother is alive. He indicated that his father is alive. He indicated that his sister is alive. He indicated that his maternal grandfather is deceased. He indicated that the status of his paternal grandfather is unknown.  Social History    Social History   Socioeconomic History   Marital status: Married    Spouse name: Not on file   Number of children: 2   Years of education: 12   Highest education level: Not on file  Occupational History   Occupation: Employed    Comment: Works at [employer] Use   Smoking status: Never   Smokeless tobacco: Former    Types: Nurse, children's Use: Never used  Substance and Sexual Activity   Alcohol use: Yes    Alcohol/week: 3.0 standard drinks of alcohol    Types: 2 Cans of beer, 1 Standard drinks or equivalent per week    Comment: none last 24 hrs   Drug use: No   Sexual activity: Yes  Other Topics Concern   Not on file  Social History Narrative   Not on file   Social Determinants of Health   Financial Resource Strain: Low Risk  (11/02/2021)   Overall Financial Resource Strain (CARDIA)    Difficulty of Paying Living Expenses: Not hard at all  Food Insecurity: No Food Insecurity (11/02/2021)   Hunger Vital Sign    Worried About Running Out of Food in the Last Year: Never true    Ran Out of Food in the Last Year: Never true  Transportation Needs: No Transportation Needs (11/02/2021)   PRAPARE - Hydrologist (Medical): No    Lack of  Transportation (Non-Medical): No  Physical Activity: Inactive (11/02/2021)   Exercise Vital Sign    Days of Exercise  per Week: 0 days    Minutes of Exercise per Session: 0 min  Stress: Not on file  Social Connections: Not on file  Intimate Partner Violence: Not At Risk (11/02/2021)   Humiliation, Afraid, Rape, and Kick questionnaire    Fear of Current or Ex-Partner: No    Emotionally Abused: No    Physically Abused: No    Sexually Abused: No     Review of Systems    General:  No chills, fever, night sweats or weight changes.  Cardiovascular:  No chest pain, dyspnea on exertion, edema, orthopnea, palpitations, paroxysmal nocturnal dyspnea. Dermatological: No rash, lesions/masses Respiratory: No cough, dyspnea Urologic: No hematuria, dysuria Abdominal:   No nausea, vomiting, diarrhea, bright red blood per rectum, melena, or hematemesis Neurologic:  No visual changes, wkns, changes in mental status. All other systems reviewed and are otherwise negative except as noted above.  Physical Exam    VS:  BP 138/88 (BP Location: Left Arm, Patient Position: Sitting, Cuff Size: Large)   Pulse 68   Ht '5\' 10"'$  (1.778 m)   Wt (!) 301 lb (136.5 kg)   BMI 43.19 kg/m  , BMI Body mass index is 43.19 kg/m. GEN: Well nourished, well developed, in no acute distress. HEENT: normal. Neck: Supple, no JVD, carotid bruits, or masses. Cardiac: RRR, no murmurs, rubs, or gallops. No clubbing, cyanosis, edema.  Radials/DP/PT 2+ and equal bilaterally.  Respiratory:  Respirations regular and unlabored, clear to auscultation bilaterally. GI: Soft, nontender, nondistended, BS + x 4. MS: no deformity or atrophy. Skin: warm and dry, no rash. Neuro:  Strength and sensation are intact. Psych: Normal affect.  Accessory Clinical Findings    Recent Labs: 10/31/2021: ALT 34 12/14/2021: BUN 16; Creatinine, Ser 0.88; Hemoglobin 14.9; Platelets 304; Potassium 3.9; Sodium 139   Recent Lipid Panel    Component  Value Date/Time   CHOL 128 10/31/2021 0946   TRIG 113 10/31/2021 0946   HDL 32 (L) 10/31/2021 0946   CHOLHDL 4.0 10/31/2021 0946   CHOLHDL 3.9 02/06/2016 1058   VLDL 30 02/06/2016 1058   LDLCALC 75 10/31/2021 0946         ECG personally reviewed by me today-normal sinus rhythm no ST or T wave deviation 68 bpm  EKG 12/14/2021 Normal sinus rhythm with PVC 65 bpm  Cath  The left ventricular systolic function is normal.   Normal LV function with an ejection fraction in the 50-55% range without residual wall motion abnormality.   Normal coronary arteries without evidence for restenosis in the distal RCA at the site of prior ulcerated plaque which had been remotely treated with PTCA.   RECOMMENDATION: Medical therapy.  Consider GI evaluation if recurrent symptomatology.  Diagnostic Dominance: Right  Intervention   Assessment & Plan   1.  Palpitations-no further episodes of increased or irregular heartbeat.  Episodes of palpitations happened in the setting of increased stress related to work.  Felt to be related to anxiety versus esophageal spasm. Continue metoprolol Heart healthy low-sodium diet Increase physical activity as tolerated Avoid caffeine, chocolate, EtOH, dehydration etc. Mindfulness stress reduction-instructions given  Coronary artery disease-no chest pain today.  Reviewed previous cardiac catheterizations.  Remains somewhat physically active and denies exertional chest discomfort. Continue metoprolol, atorvastatin, Plavix, aspirin, amlodipine Heart healthy low-sodium diet-salty 6 given Increase physical activity as tolerated Order echocardiogram  Essential hypertension-BP today 138/88.  Reports elevated blood pressure at home in the 160s-170s over 80s-100s. Increase metoprolol to 100 mg daily Maintain blood pressure log  Hyperlipidemia-LDL 75 on 10/31/21 Continue atorvastatin, aspirin Heart healthy low-sodium high-fiber diet Increase physical activity  as tolerated  PVCs, PACs-notes increased palpitations with increased anxiety..  EKG 12/14/2021 showed sinus rhythm with PVC 65 bpm.  EKG today shows normal sinus rhythm 68 bpm no ST or T wave deviation. Continue metoprolol  Maintain p.o. hydration    Disposition: Follow-up with Dr. Claiborne Billings in 2-3 months.   Jossie Ng. Adalynn Corne NP-C     12/17/2021, 9:09 AM [address] Office [phone] Fax [phone]  Notice: This dictation was prepared with Dragon dictation along with smaller phrase technology. Any transcriptional errors that result from this process are unintentional and may not be corrected upon review.  I spent 14 minutes examining this patient, reviewing medications, and using patient centered shared decision making involving her cardiac care.  Prior to her visit I spent greater than 20 minutes reviewing her past medical history,  medications, and prior cardiac tests.

## 2021-12-17 ENCOUNTER — Ambulatory Visit: Payer: BC Managed Care – PPO | Attending: General Practice | Admitting: General Practice

## 2021-12-17 ENCOUNTER — Encounter: Payer: Self-pay | Admitting: General Practice

## 2021-12-17 VITALS — BP 138/88 | HR 68 | Ht 70.0 in | Wt 301.0 lb

## 2021-12-17 DIAGNOSIS — E785 Hyperlipidemia, unspecified: Secondary | ICD-10-CM | POA: Diagnosis not present

## 2021-12-17 DIAGNOSIS — I2510 Atherosclerotic heart disease of native coronary artery without angina pectoris: Secondary | ICD-10-CM | POA: Diagnosis not present

## 2021-12-17 DIAGNOSIS — R002 Palpitations: Secondary | ICD-10-CM

## 2021-12-17 DIAGNOSIS — G4733 Obstructive sleep apnea (adult) (pediatric): Secondary | ICD-10-CM

## 2021-12-17 DIAGNOSIS — I493 Ventricular premature depolarization: Secondary | ICD-10-CM

## 2021-12-17 MED ORDER — METOPROLOL SUCCINATE ER 100 MG PO TB24
100.0000 mg | ORAL_TABLET | Freq: Every day | ORAL | 3 refills | Status: DC
Start: 2021-12-17 — End: 2022-03-15

## 2021-12-17 NOTE — Patient Instructions (Signed)
Medication Instructions:  INCREASE METOPROLOL '100MG'$  DAILY *If you need a refill on your cardiac medications before your next appointment, please call your pharmacy*  Lab Work: NONE  Testing/Procedures: Echocardiogram - Your physician has requested that you have an echocardiogram. Echocardiography is a painless test that uses sound waves to create images of your heart. It provides your doctor with information about the size and shape of your heart and how well your heart's chambers and valves are working. This procedure takes approximately one hour. There are no restrictions for this procedure.   Follow-Up: At [facility], you and your health needs are our priority.  As part of our continuing mission to provide you with exceptional heart care, we have created designated Provider Care Teams.  These Care Teams include your primary Cardiologist (physician) and Advanced Practice Providers (APPs -  Physician Assistants and Nurse Practitioners) who all work together to provide you with the care you need, when you need it.  Your next appointment:   2-3 month(s)  The format for your next appointment:   In Person  Provider:   Shelva Majestic, MD  or Coletta Memos, FNP       Other Instructions PLEASE READ AND FOLLOW ATTACHED  SALTY 6   PLEASE READ AND FOLLOW STRESS REDUCTION TIPS-ATTACHED  TAKE AND LOG YOUR BLOOD PRESSURE-BRING TO YOUR FOLLOW UP APPOINTMENT  DISCUSS ANXIETY OPTIONS WITH YOUR PRIMARY CARE MD  MAINTAIN YOUR HYDRATION  INCREASE YOUR PHYSICAL ACTIVITY-AS TOLERATED  Important Information About Sugar         Mindfulness-Based Stress Reduction Mindfulness-based stress reduction (MBSR) is a program that helps people learn to practice mindfulness. Mindfulness is the practice of consciously paying attention to the present moment. MBSR focuses on developing self-awareness, which lets you respond to life stress without judgment or negative feelings. It can be learned and  practiced through techniques such as education, breathing exercises, meditation, and yoga. MBSR includes several mindfulness techniques in one program. MBSR works best when you understand the treatment, are willing to try new things, and can commit to spending time practicing what you learn. MBSR training may include learning about: How your feelings, thoughts, and reactions affect your body. New ways to respond to things that cause negative thoughts to start (triggers). How to notice your thoughts and let go of them. Practicing awareness of everyday things that you normally do without thinking. The techniques and goals of different types of meditation. What are the benefits of MBSR? MBSR can have many benefits, which include helping you to: Develop self-awareness. This means knowing and understanding yourself. Learn skills and attitudes that help you to take part in your own health care. Learn new ways to care for yourself. Be more accepting about how things are, and let things go. Be less judgmental and approach things with an open mind. Be patient with yourself and trust yourself more. MBSR has also been shown to: Reduce negative emotions, such as sadness, overwhelm, and worry. Improve memory and focus. Change how you sense and react to pain. Boost your body's ability to fight infections. Help you connect better with other people. Improve your sense of well-being. How to practice mindfulness To do a basic awareness exercise: Find a comfortable place to sit. Pay attention to the present moment. Notice your thoughts, feelings, and surroundings just as they are. Avoid judging yourself, your feelings, or your surroundings. Make note of any judgment that comes up and let it go. Your mind may wander, and that is okay.  Make note of when your thoughts drift, and return your attention to the present moment. To do basic mindfulness meditation: Find a comfortable place to sit. This may include a  stable chair or a firm floor cushion. Sit upright with your back straight. Let your arms fall next to your sides, with your hands resting on your legs. If you are sitting in a chair, rest your feet flat on the floor. If you are sitting on a cushion, cross your legs in front of you. Keep your head in a neutral position with your chin dropped slightly. Relax your jaw and rest the tip of your tongue on the roof of your mouth. Drop your gaze to the floor or close your eyes. Breathe normally and pay attention to your breath. Feel the air moving in and out of your nose. Feel your belly expanding and relaxing with each breath. Your mind may wander, and that is okay. Make note of when your thoughts drift, and return your attention to your breath. Avoid judging yourself, your feelings, or your surroundings. Make note of any judgment or feelings that come up, let them go, and bring your Follow these instructions at home:  Find a local in-person or online MBSR program. Set aside some time regularly for mindfulness practice. Practice every day if you can. Even 10 minutes of practice is helpful. Find a mindfulness practice that works best for you. This may include one or more of the following: Meditation. This involves focusing your mind on a certain thought or activity. Breathing awareness exercises. These help you to stay present by focusing on your breath. Body scan. For this practice, you lie down and pay attention to each part of your body from head to toe. You can identify tension and soreness and consciously relax parts of your body. Yoga. Yoga involves stretching and breathing, and it can improve your ability to move and be flexible. It can also help you to test your body's limits, which can help you release stress. Mindful eating. This way of eating involves focusing on the taste, texture, color, and smell of each bite of food. This slows down eating and helps you feel full sooner. For this reason, it  can be an important part of a weight loss plan. Find a podcast or recording that provides guidance for breathing awareness, body scan, or meditation exercises. You can listen to these any time when you have a free moment to rest without distractions. Follow your treatment plan as told by your health care provider. This may include taking regular medicines and making changes to your diet or lifestyle as recommended. Where to find more information You can find more information about MBSR from: Your health care provider. Community-based meditation centers or programs. Programs offered near you. Summary Mindfulness-based stress reduction (MBSR) is a program that teaches you how to consciously pay attention to the present moment. It is used to help you deal better with daily stress, feelings, and pain. MBSR focuses on developing self-awareness, which allows you to respond to life stress without judgment or negative feelings. MBSR programs may involve learning different mindfulness practices, such as breathing exercises, meditation, yoga, body scan, or mindful eating. Find a mindfulness practice that works best for you, and set aside time for it on a regular basis. This information is not intended to replace advice given to you by your health care provider. Make sure you discuss any questions you have with your health care provider. Document Revised: 09/14/2020 Document Reviewed: 09/14/2020  Elsevier Patient Education  2023 Elsevier Inc.  

## 2021-12-24 ENCOUNTER — Ambulatory Visit (HOSPITAL_COMMUNITY)
Admission: RE | Admit: 2021-12-24 | Discharge: 2021-12-24 | Disposition: A | Discharge status: Home / Self Care | Payer: BC Managed Care – PPO | Source: Ambulatory Visit | Attending: General Practice | Admitting: General Practice

## 2021-12-24 DIAGNOSIS — I2510 Atherosclerotic heart disease of native coronary artery without angina pectoris: Secondary | ICD-10-CM | POA: Diagnosis not present

## 2021-12-24 LAB — ECHOCARDIOGRAM COMPLETE
Area-P 1/2: 3.48 cm2
S' Lateral: 3.3 cm

## 2021-12-24 NOTE — Progress Notes (Signed)
*  PRELIMINARY RESULTS* Echocardiogram 2D Echocardiogram has been performed.  Eduardo Poole 12/24/2021, 9:41 AM

## 2021-12-26 ENCOUNTER — Other Ambulatory Visit: Payer: Self-pay | Admitting: Cardiovascular Disease

## 2022-01-03 ENCOUNTER — Telehealth: Payer: Self-pay | Admitting: Cardiovascular Disease

## 2022-01-03 NOTE — Telephone Encounter (Signed)
Called patient, made aware of ECHO results.   Patient verbalized understanding, thankful for call back.

## 2022-01-03 NOTE — Telephone Encounter (Signed)
Patient is requesting a call back to discuss echo results. 

## 2022-01-06 ENCOUNTER — Other Ambulatory Visit: Payer: Self-pay | Admitting: Cardiovascular Disease

## 2022-01-12 NOTE — Telephone Encounter (Signed)
Acknowledged.

## 2022-01-16 DIAGNOSIS — M5416 Radiculopathy, lumbar region: Secondary | ICD-10-CM | POA: Diagnosis not present

## 2022-01-16 DIAGNOSIS — M545 Low back pain, unspecified: Secondary | ICD-10-CM | POA: Diagnosis not present

## 2022-01-16 DIAGNOSIS — M25551 Pain in right hip: Secondary | ICD-10-CM | POA: Diagnosis not present

## 2022-01-22 DIAGNOSIS — M5416 Radiculopathy, lumbar region: Secondary | ICD-10-CM | POA: Diagnosis not present

## 2022-01-22 DIAGNOSIS — M25551 Pain in right hip: Secondary | ICD-10-CM | POA: Diagnosis not present

## 2022-01-22 DIAGNOSIS — M545 Low back pain, unspecified: Secondary | ICD-10-CM | POA: Diagnosis not present

## 2022-02-08 DIAGNOSIS — M5416 Radiculopathy, lumbar region: Secondary | ICD-10-CM | POA: Diagnosis not present

## 2022-02-08 DIAGNOSIS — M545 Low back pain, unspecified: Secondary | ICD-10-CM | POA: Diagnosis not present

## 2022-02-08 DIAGNOSIS — M25551 Pain in right hip: Secondary | ICD-10-CM | POA: Diagnosis not present

## 2022-02-17 ENCOUNTER — Other Ambulatory Visit: Payer: Self-pay | Admitting: Family Medicine

## 2022-02-17 DIAGNOSIS — F419 Anxiety disorder, unspecified: Secondary | ICD-10-CM

## 2022-02-19 NOTE — Telephone Encounter (Signed)
Requested Prescriptions  Pending Prescriptions Disp Refills   sertraline (ZOLOFT) 100 MG tablet [Pharmacy Med Name: SERTRALINE HCL 100 MG TABLET] 180 tablet 0    Sig: TAKE 2 TABLETS BY MOUTH EVERY DAY     Psychiatry:  Antidepressants - SSRI - sertraline Passed - 02/17/2022  9:20 AM      Passed - AST in normal range and within 360 days    AST  Date Value Ref Range Status  10/31/2021 24 0 - 40 IU/L Final   SGOT(AST)  Date Value Ref Range Status  04/26/2013 48 (H) 15 - 37 Unit/L Final         Passed - ALT in normal range and within 360 days    ALT  Date Value Ref Range Status  10/31/2021 34 0 - 44 IU/L Final   SGPT (ALT)  Date Value Ref Range Status  04/26/2013 66 12 - 78 U/L Final         Passed - Completed PHQ-2 or PHQ-9 in the last 360 days      Passed - Valid encounter within last 6 months    Recent Outpatient Visits           3 months ago Annual physical exam   [facility] Birdie Sons, MD   7 months ago Diverticulitis   [facility] Birdie Sons, MD   1 year ago Right inguinal hernia   [facility] Birdie Sons, MD   1 year ago Annual physical exam   [facility] Birdie Sons, MD   1 year ago Subacute pansinusitis   [place], Vickki Muff, PA-C       Future Appointments             In 1 week Stoioff, Ronda Fairly, MD [place]   In 2 weeks Cleaver, Jossie Ng, NP [facility] [place] [place]   In 3 weeks Ralene Bathe, MD [place]

## 2022-02-28 ENCOUNTER — Ambulatory Visit: Payer: BC Managed Care – PPO | Admitting: Urology

## 2022-03-04 NOTE — Progress Notes (Signed)
Cardiology Clinic Note   Patient Name: Eduardo Poole Date of Encounter: 03/06/2022  Primary Care Provider:  Birdie Sons, MD Primary Cardiologist:  Shelva Majestic, MD  Patient Profile    Eduardo Poole 52 year old male presents to the clinic today for follow-up evaluation of his hypertension, coronary artery disease and PVCs.  Past Medical History    Past Medical History:  Diagnosis Date   CAD (coronary artery disease)    a. 07/2014 Inf STEMI/PTCA:  LM nl, LAD nl, LCX nl, RCA ulcerated plaque (PTCA only);  b. 10/2014 MV: intermediate risk, ? inflat isch/scar;  c. 10/2014 Cath: Nl cors w/o RCA restenosis.   Diastolic dysfunction    a. 07/2014 Echo: EF 60-65%, Gr1 DD.   DVT of lower extremity (deep venous thrombosis) ([place])    a. ~ 2009, per pt was on lovenox/coumadin   GERD (gastroesophageal reflux disease)    History of dysplastic nevus 12/07/2018   right post waistline/moderate, left low back/moderate, left mid back lateral near side/moderate    Hyperlipidemia    Hypertension    Hypertensive heart disease    ST elevation myocardial infarction (STEMI) involving right coronary artery with complication ([place]) 08/19/945   TIA (transient ischemic attack) 07/23/2014   Past Surgical History:  Procedure Laterality Date   ARTHROPLASTY Right 07/09/2010   CARDIAC CATHETERIZATION N/A 07/23/2014   Procedure: Left Heart Cath;  Surgeon: Troy Sine, MD;  Location: [facility];  Service: Cardiovascular;  Laterality: N/A;   CARDIAC CATHETERIZATION N/A 07/23/2014   Procedure: Coronary Balloon Angioplasty;  Surgeon: Troy Sine, MD;  Location: [facility];  Service: Cardiovascular;  Laterality: N/A;   CARDIAC CATHETERIZATION N/A 11/10/2014   Procedure: Left Heart Cath and Coronary Angiography;  Surgeon: Troy Sine, MD;  Location: [facility];  Service: Cardiovascular;  Laterality: N/A;   CERVICAL DISC SURGERY  2015   COLONOSCOPY     COLONOSCOPY      COLONOSCOPY WITH PROPOFOL N/A 11/23/2020   Procedure: COLONOSCOPY WITH PROPOFOL;  Surgeon: Virgel Manifold, MD;  Location: [facility];  Service: Endoscopy;  Laterality: N/A;   JOINT REPLACEMENT Right    Right knee   MENISCUS REPAIR Right 11/03/1995   Meniscus Lateral Tear: repaired by Dr. Sabra Heck at [facility]   REPLACEMENT TOTAL KNEE Right 2011   [facility]    Allergies  No Known Allergies  History of Present Illness    Eduardo Poole coronary artery disease with status post STEMI 6/16, (normal left main, normal LAD, normal circumflex, ulcerated plaque RCA, PTCA only, repeat catheterization 9/16 with normal coronaries without RCA restenosis.,  Diastolic dysfunction, DVT 2009, GERD, HLD, HTN, TIA.  He was seen by Dr. Claiborne Billings on 09/11/2020.  During that time he reported monitoring his blood pressure regularly.  His blood pressure was ranging in the 096-283 systolic range.  He reported that prior to his visit he had slipped on bleachers while at his son's baseball game.  Since that time he had noted bilateral knee discomfort.  He is status post prior knee surgeries.  He reported a 10 pound weight gain due to not being as physically active.  He denied chest pain.  He was unaware of palpitations.  He was compliant with his CPAP.  His DVT resolved and he was taken off anticoagulation.  Target LDL less than 70.  He was seen in the  emergency department on 12/14/2021.  He reported palpitations starting the previous day.  He indicated that it felt like his heart was beating fast.  He reported shortness of breath and chest tightness.  His symptoms lasted few minutes and then go away.  He reported similar symptoms to when he had his heart attack.  He felt these were more severe.  His BMP and CBC were unremarkable.  His high-sensitivity troponins were 13 and 12.  His chest x-ray showed no acute changes.  His EKG showed normal sinus  rhythm with PVCs and PACs.  He denied pleuritic discomfort.  His cardiac catheterization x2 in 2016 were reviewed.  Patient felt like his symptoms may be related to anxiety.  It was felt that that was the most likely case.  It was also felt that differential could be esophageal spasm however was felt to be unlikely.  Outpatient follow-up with cardiology was planned.  He presented to the clinic 12/17/21 for follow-up evaluation stated he had another episode of chest discomfort the prior day.  He noticed this discomfort with waking up in the morning and it continued until about midday.  It then resolved on its own.   He reported that he had COVID about 4 weeks prior.  He had been increasing his physical activity with regular exercise prior to COVID infection and discontinued exercise after his infection.  His main symptom  was GI upset.  He maintained hydration throughout his illness.  He followed up with his PCP .  We reviewed his recent ED visit and his blood pressure . He expressed understanding.  We also reviewed his cardiac catheterizations from 2016 and his lipid panel.  His chest discomfort appeared to be atypical in nature.  I  ordered an echocardiogram, gave  stress reduction sheet, increased his metoprolol to 100 mg daily and planned follow-up in 2 to 3 months.  Echocardiogram 12/24/2021 showed normal EF 55-60%, intermediate diastolic parameters and no significant valvular abnormalities.  He presents to the clinic today for follow-up evaluation and states he has been doing fairly well.  He has been working on hydration and a better diet.  He continues to be physically active.  Initially his blood pressure today in the clinic is 146/80 and on recheck is 136/84.  We reviewed his echocardiogram and current medications.  He is in the process of trying to obtain [facility] from the pharmacy.  He has not had luck obtaining medication due to his popularity.  I have asked him to continue to monitor his blood  pressure, continue to eat a low-sodium diet and continue his physical activity.  We will plan for repeat fasting lipids in September and have him follow-up in October.  Today he denies chest pain, shortness of breath, lower extremity edema, fatigue, palpitations, melena, hematuria, hemoptysis, diaphoresis, weakness, presyncope, syncope, orthopnea, and PND.     Home Medications    Prior to Admission medications   Medication Sig Start Date End Date Taking? Authorizing Provider  amLODipine (NORVASC) 5 MG tablet TAKE 1 TABLET (5 MG TOTAL) BY MOUTH DAILY. 06/25/21   Troy Sine, MD  aspirin EC 81 MG EC tablet Take 1 tablet (81 mg total) by mouth daily. Patient taking differently: Take 81 mg by mouth at bedtime. 07/26/14   Brett Canales, PA-C  atorvastatin (LIPITOR) 80 MG tablet Take 1 tablet (80 mg total) by mouth daily. Patient need to schedule appointment for future refills first attempt 11/22/21   Troy Sine, MD  benzonatate (  TESSALON) 100 MG capsule Take 1 capsule (100 mg total) by mouth 2 (two) times daily as needed for cough. 11/26/21   Montine Circle, PA-C  clindamycin (CLEOCIN T) 1 % external solution Apply topically daily. Use at effected at back of neck on Monday, Wednesday, and Friday weekly 03/08/21 03/08/22  Ralene Bathe, MD  clopidogrel (PLAVIX) 75 MG tablet TAKE 1 TABLET BY MOUTH EVERY DAY 03/26/21   Troy Sine, MD  gabapentin (NEURONTIN) 300 MG capsule gabapentin 300 mg capsule  TAKE 1 CAPSULE BY MOUTH TWICE A DAY    [provider]  Halobetasol Propionate (LEXETTE) 0.05 % FOAM Apply 1 application topically See admin instructions. Apply to affected areas at back of neck daily on Tuesday, Thursday, Saturdays weekly. Can also apply daily as needed for eczema at left arm see handout. 03/08/21   Ralene Bathe, MD  metoprolol succinate (TOPROL-XL) 50 MG 24 hr tablet Take 1.5 tablets (75 mg total) by mouth daily. Take with or immediately following a meal. 12/14/21    Troy Sine, MD  nitroGLYCERIN (NITROSTAT) 0.4 MG SL tablet For chest pain, tightness, or pressure. While sitting, place 1 tablet under tongue. May be used every 5 minutes as needed, for up to 15 minutes. Do not use more than 3 tablets. 12/14/21   Troy Sine, MD  Semaglutide-Weight Management (WEGOVY) 0.25 MG/0.5ML SOAJ 0.'25mg'$  once a week x 4. For BMI =42 10/30/21   Birdie Sons, MD  sertraline (ZOLOFT) 100 MG tablet TAKE 2 TABLETS BY MOUTH EVERY DAY 11/22/21   Birdie Sons, MD  tamsulosin (FLOMAX) 0.4 MG CAPS capsule TAKE 1 CAPSULE BY MOUTH EVERY DAY 12/10/21   Stoioff, Ronda Fairly, MD  tiZANidine (ZANAFLEX) 4 MG tablet tizanidine 4 mg tablet  TAKE 1 TABLET BY MOUTH EVERY 6 HOURS AS NEEDED    [provider]  valsartan (DIOVAN) 320 MG tablet Take 1 tablet (320 mg total) by mouth daily. NEED OV. 12/10/21   Troy Sine, MD    Family History    Family History  Problem Relation Age of Onset   Hypertension Mother    Arthritis Mother    Diverticulitis Mother    Gout Father    Autoimmune disease Father    Arthritis Sister    Diabetes Paternal Grandfather    Diabetes Maternal Grandfather    Cancer Maternal Grandfather        unknown   He indicated that his mother is alive. He indicated that his father is alive. He indicated that his sister is alive. He indicated that his maternal grandfather is deceased. He indicated that the status of his paternal grandfather is unknown.  Social History    Social History   Socioeconomic History   Marital status: Married    Spouse name: Not on file   Number of children: 2   Years of education: 12   Highest education level: Not on file  Occupational History   Occupation: Employed    Comment: Works at [employer] Use   Smoking status: Never   Smokeless tobacco: Former    Types: Nurse, children's Use: Never used  Substance and Sexual Activity   Alcohol use: Yes    Alcohol/week: 3.0 standard drinks of  alcohol    Types: 2 Cans of beer, 1 Standard drinks or equivalent per week    Comment: none last 24 hrs   Drug use: No   Sexual activity: Yes  Other Topics Concern   Not on file  Social History Narrative   Not on file   Social Determinants of Health   Financial Resource Strain: Low Risk  (11/02/2021)   Overall Financial Resource Strain (CARDIA)    Difficulty of Paying Living Expenses: Not hard at all  Food Insecurity: No Food Insecurity (11/02/2021)   Hunger Vital Sign    Worried About Running Out of Food in the Last Year: Never true    Ran Out of Food in the Last Year: Never true  Transportation Needs: No Transportation Needs (11/02/2021)   PRAPARE - Hydrologist (Medical): No    Lack of Transportation (Non-Medical): No  Physical Activity: Inactive (11/02/2021)   Exercise Vital Sign    Days of Exercise per Week: 0 days    Minutes of Exercise per Session: 0 min  Stress: Not on file  Social Connections: Not on file  Intimate Partner Violence: Not At Risk (11/02/2021)   Humiliation, Afraid, Rape, and Kick questionnaire    Fear of Current or Ex-Partner: No    Emotionally Abused: No    Physically Abused: No    Sexually Abused: No     Review of Systems    General:  No chills, fever, night sweats or weight changes.  Cardiovascular:  No chest pain, dyspnea on exertion, edema, orthopnea, palpitations, paroxysmal nocturnal dyspnea. Dermatological: No rash, lesions/masses Respiratory: No cough, dyspnea Urologic: No hematuria, dysuria Abdominal:   No nausea, vomiting, diarrhea, bright red blood per rectum, melena, or hematemesis Neurologic:  No visual changes, wkns, changes in mental status. All other systems reviewed and are otherwise negative except as noted above.  Physical Exam    VS:  BP 136/84   Pulse 63   Ht '5\' 10"'$  (1.778 m)   Wt 296 lb 6.4 oz (134.4 kg)   SpO2 97%   BMI 42.53 kg/m  , BMI Body mass index is 42.53 kg/m. GEN: Well nourished,  well developed, in no acute distress. HEENT: normal. Neck: Supple, no JVD, carotid bruits, or masses. Cardiac: RRR, no murmurs, rubs, or gallops. No clubbing, cyanosis, slight nonpitting right lower extremity edema edema.  Radials/DP/PT 2+ and equal bilaterally.  Respiratory:  Respirations regular and unlabored, clear to auscultation bilaterally. GI: Soft, nontender, nondistended, BS + x 4. MS: no deformity or atrophy. Skin: warm and dry, no rash. Neuro:  Strength and sensation are intact. Psych: Normal affect.  Accessory Clinical Findings    Recent Labs: 10/31/2021: ALT 34 12/14/2021: BUN 16; Creatinine, Ser 0.88; Hemoglobin 14.9; Platelets 304; Potassium 3.9; Sodium 139   Recent Lipid Panel    Component Value Date/Time   CHOL 128 10/31/2021 0946   TRIG 113 10/31/2021 0946   HDL 32 (L) 10/31/2021 0946   CHOLHDL 4.0 10/31/2021 0946   CHOLHDL 3.9 02/06/2016 1058   VLDL 30 02/06/2016 1058   LDLCALC 75 10/31/2021 0946         ECG personally reviewed by me today-none today.  EKG 12/17/2021 normal sinus rhythm no ST or T wave deviation 68 bpm  EKG 12/14/2021 Normal sinus rhythm with PVC 65 bpm  Cath 9/16  The left ventricular systolic function is normal.   Normal LV function with an ejection fraction in the 50-55% range without residual wall motion abnormality.   Normal coronary arteries without evidence for restenosis in the distal RCA at the site of prior ulcerated plaque which had been remotely treated with PTCA.   RECOMMENDATION: Medical therapy.  Consider  GI evaluation if recurrent symptomatology.  Diagnostic Dominance: Right  Intervention  Echocardiogram 12/24/2021   IMPRESSIONS     1. Left ventricular ejection fraction, by estimation, is 55 to 60%. The  left ventricle has normal function. The left ventricle has no regional  wall motion abnormalities. Left ventricular diastolic parameters are  indeterminate.   2. Right ventricular systolic function is  normal. The right ventricular  size is normal.   3. Valvular stenoses or regurgitation: None   4. The inferior vena cava is normal in size with greater than 50%  respiratory variability, suggesting right atrial pressure of 3 mmHg.   FINDINGS   Left Ventricle: Left ventricular ejection fraction, by estimation, is 55  to 60%. The left ventricle has normal function. The left ventricle has no  regional wall motion abnormalities. The left ventricular internal cavity  size was normal in size. There is   no left ventricular hypertrophy. Left ventricular diastolic parameters  are indeterminate.   Right Ventricle: The right ventricular size is normal. No increase in  right ventricular wall thickness. Right ventricular systolic function is  normal.   Left Atrium: Left atrial size was mildly dilated.   Right Atrium: Right atrial size was normal in size.   Pericardium: There is no evidence of pericardial effusion.   Mitral Valve: The mitral valve is grossly normal. No evidence of mitral  valve regurgitation. No evidence of mitral valve stenosis.   Tricuspid Valve: The tricuspid valve is not well visualized. Tricuspid  valve regurgitation is not demonstrated. No evidence of tricuspid  stenosis.   Aortic Valve: The aortic valve is normal in structure. Aortic valve  regurgitation is not visualized. No aortic stenosis is present.   Pulmonic Valve: The pulmonic valve was not well visualized. Pulmonic valve  regurgitation is not visualized. No evidence of pulmonic stenosis.   Aorta: The aortic root is normal in size and structure.   Venous: The inferior vena cava is normal in size with greater than 50%  respiratory variability, suggesting right atrial pressure of 3 mmHg.   IAS/Shunts: No atrial level shunt detected by color flow Doppler.    Assessment & Plan   1.  Coronary artery disease- continues to denies recent episodes of chest discomfort. Repeat cardiac catheterization 2016 showed  normal coronary arteries without RCA restenosis.  Remains  physically active and denies exertional chest discomfort.  Echocardiogram 12/24/2021 showed normal LV function with intermediate diastolic parameters.  Details above. Continue current medical therapy Heart healthy low-sodium diet-salty reviewed Increase physical activity as tolerated   Palpitations, PVCs, PACs-well-controlled.  Previous episodes of palpitations happened in the setting of increased stress related to work.  Felt to be related to anxiety versus esophageal spasm. EKG 12/14/2021 showed sinus rhythm with PVC 65 bpm.   Continue metoprolol Heart healthy low-sodium diet Increase physical activity as tolerated Continue to avoid caffeine, chocolate, EtOH, dehydration etc.   Essential hypertension-BP today 136/84.   Continue metoprolol, amlodipine, valsartan Continue to monitor  Hyperlipidemia-LDL 75 on 10/31/21 Continue atorvastatin, aspirin Heart healthy low-sodium high-fiber diet Increase physical activity as tolerated Plan for repeat fasting lipids and LFTs 9/24     Disposition: Follow-up with Dr. Claiborne Billings in October.   Jossie Ng. Hartlee Amedee NP-C     03/06/2022, 1:42 PM [facility] [place] [address] Office [phone] Fax [phone]  Notice: This dictation was prepared with Dragon dictation along with smaller phrase technology. Any transcriptional errors that result from this process are unintentional and may  not be corrected upon review.  I spent 14 minutes examining this patient, reviewing medications, and using patient centered shared decision making involving her cardiac care.  Prior to her visit I spent greater than 20 minutes reviewing her past medical history,  medications, and prior cardiac tests.

## 2022-03-06 ENCOUNTER — Ambulatory Visit: Payer: BC Managed Care – PPO | Attending: General Practice | Admitting: General Practice

## 2022-03-06 ENCOUNTER — Encounter: Payer: Self-pay | Admitting: General Practice

## 2022-03-06 VITALS — BP 136/84 | HR 63 | Ht 70.0 in | Wt 296.4 lb

## 2022-03-06 DIAGNOSIS — I2510 Atherosclerotic heart disease of native coronary artery without angina pectoris: Secondary | ICD-10-CM

## 2022-03-06 DIAGNOSIS — E785 Hyperlipidemia, unspecified: Secondary | ICD-10-CM

## 2022-03-06 DIAGNOSIS — I10 Essential (primary) hypertension: Secondary | ICD-10-CM

## 2022-03-06 DIAGNOSIS — I493 Ventricular premature depolarization: Secondary | ICD-10-CM

## 2022-03-06 DIAGNOSIS — R002 Palpitations: Secondary | ICD-10-CM

## 2022-03-06 NOTE — Patient Instructions (Signed)
Medication Instructions:  The current medical regimen is effective;  continue present plan and medications as directed. Please refer to the Current Medication list given to you today.  *If you need a refill on your cardiac medications before your next appointment, please call your pharmacy*  Lab Work: FASTING LIPID AND LFT IN [place] If you have labs (blood work) drawn today and your tests are completely normal, you will receive your results only by: [place] (if you have MyChart) OR  A paper copy in the mail If you have any lab test that is abnormal or we need to change your treatment, we will call you to review the results.  Testing/Procedures: NONE  Follow-Up: At [facility], you and your health needs are our priority.  As part of our continuing mission to provide you with exceptional heart care, we have created designated Provider Care Teams.  These Care Teams include your primary Cardiologist (physician) and Advanced Practice Providers (APPs -  Physician Assistants and Nurse Practitioners) who all work together to provide you with the care you need, when you need it.  Your next appointment:   OCTOBER  2024  Provider:   Shelva Majestic, MD     Other Instructions PLEASE READ AND FOLLOW ATTACHED  SALTY 6   INCREASE PHYSICAL ACTIVITY AS TOLERATED

## 2022-03-07 ENCOUNTER — Other Ambulatory Visit: Payer: Self-pay | Admitting: Cardiovascular Disease

## 2022-03-08 DIAGNOSIS — M533 Sacrococcygeal disorders, not elsewhere classified: Secondary | ICD-10-CM | POA: Diagnosis not present

## 2022-03-14 ENCOUNTER — Ambulatory Visit: Payer: BC Managed Care – PPO | Admitting: Dermatology

## 2022-03-15 ENCOUNTER — Other Ambulatory Visit: Payer: Self-pay | Admitting: General Practice

## 2022-04-19 ENCOUNTER — Ambulatory Visit: Payer: Self-pay

## 2022-04-19 ENCOUNTER — Ambulatory Visit (INDEPENDENT_AMBULATORY_CARE_PROVIDER_SITE_OTHER): Payer: BC Managed Care – PPO | Admitting: Family Medicine

## 2022-04-19 VITALS — BP 140/86 | HR 63 | Temp 98.2°F | Wt 289.0 lb

## 2022-04-19 DIAGNOSIS — K5792 Diverticulitis of intestine, part unspecified, without perforation or abscess without bleeding: Secondary | ICD-10-CM

## 2022-04-19 DIAGNOSIS — R1032 Left lower quadrant pain: Secondary | ICD-10-CM

## 2022-04-19 MED ORDER — CIPROFLOXACIN HCL 500 MG PO TABS: 500.0000 mg | ORAL_TABLET | Freq: Two times a day (BID) | ORAL | 0 refills | Status: AC

## 2022-04-19 MED ORDER — METRONIDAZOLE 500 MG PO TABS: 500.0000 mg | ORAL_TABLET | Freq: Three times a day (TID) | ORAL | 0 refills | Status: AC

## 2022-04-19 NOTE — Progress Notes (Signed)
Ar[place]ina Ponder [place],acting as a scribe for Lelon Huh, MD.,have documented all relevant documentation on the behalf of Lelon Huh, MD,as directed by  Lelon Huh, MD while in the presence of Lelon Huh, MD.     Established patient visit   Patient: Eduardo Poole   DOB: 12-Nov-1970   52 y.o. Male  MRN: [id number] Visit Date: 04/19/2022  Today's healthcare provider: Lelon Huh, MD   Chief Complaint  Patient presents with   Abdominal Pain   Subjective    HPI  Abdominal pain Patient is a 52 year old male who presents for abdominal pain.  He reports that he started having sharp stabbing like pain for about 9-10 hours.  After that he states it eased up and since then has been an aching like pain.   Patient denies any fever, nausea or diarrhea during that time.  He states today he doesn't feel well.   Patient does have history of diverticulitis.  His last episode was about 5 months ago.  At that time he was treated with antibiotics after being seen in the ER. Patient reports this being similar to that episode.      Medications: Outpatient Medications Prior to Visit  Medication Sig   amLODipine (NORVASC) 5 MG tablet TAKE 1 TABLET (5 MG TOTAL) BY MOUTH DAILY.   aspirin EC 81 MG EC tablet Take 1 tablet (81 mg total) by mouth daily. (Patient taking differently: Take 81 mg by mouth at bedtime.)   atorvastatin (LIPITOR) 80 MG tablet Take 1 tablet (80 mg total) by mouth daily.   clopidogrel (PLAVIX) 75 MG tablet TAKE 1 TABLET BY MOUTH EVERY DAY   Halobetasol Propionate (LEXETTE) 0.05 % FOAM Apply 1 application topically See admin instructions. Apply to affected areas at back of neck daily on Tuesday, Thursday, Saturdays weekly. Can also apply daily as needed for eczema at left arm see handout.   metoprolol succinate (TOPROL-XL) 100 MG 24 hr tablet TAKE 1 TABLET BY MOUTH DAILY. TAKE WITH OR IMMEDIATELY FOLLOWING A MEAL.   nitroGLYCERIN (NITROSTAT) 0.4 MG SL tablet For chest pain,  tightness, or pressure. While sitting, place 1 tablet under tongue. May be used every 5 minutes as needed, for up to 15 minutes. Do not use more than 3 tablets.   sertraline (ZOLOFT) 100 MG tablet TAKE 2 TABLETS BY MOUTH EVERY DAY   tamsulosin (FLOMAX) 0.4 MG CAPS capsule TAKE 1 CAPSULE BY MOUTH EVERY DAY   tiZANidine (ZANAFLEX) 4 MG tablet tizanidine 4 mg tablet  TAKE 1 TABLET BY MOUTH EVERY 6 HOURS AS NEEDED   valsartan (DIOVAN) 320 MG tablet Take 1 tablet (320 mg total) by mouth daily.   benzonatate (TESSALON) 100 MG capsule Take 1 capsule (100 mg total) by mouth 2 (two) times daily as needed for cough.   gabapentin (NEURONTIN) 300 MG capsule gabapentin 300 mg capsule  TAKE 1 CAPSULE BY MOUTH TWICE A DAY   Semaglutide-Weight Management (WEGOVY) 0.25 MG/0.5ML SOAJ 0.'25mg'$  once a week x 4. For BMI =42   No facility-administered medications prior to visit.    Review of Systems  Constitutional:  Negative for chills and fever.  Respiratory: Negative.  Negative for cough, shortness of breath and wheezing.   Cardiovascular:  Negative for chest pain, palpitations and leg swelling.  Gastrointestinal:  Positive for abdominal pain and nausea. Negative for anal bleeding, blood in stool, constipation, diarrhea, rectal pain and vomiting.  Neurological:  Negative for weakness and headaches.  Objective    BP (!) 140/86 (BP Location: Left Arm, Patient Position: Sitting, Cuff Size: Large)   Pulse 63   Temp 98.2 F (36.8 C) (Oral)   Wt 289 lb (131.1 kg)   SpO2 99%   BMI 41.47 kg/m    Physical Exam   General Appearance:    Obese male, alert, cooperative, in no acute distress  Eyes:    PERRL, conjunctiva/corneas clear, EOM's intact       Lungs:     Clear to auscultation bilaterally, respirations unlabored  Heart:    Normal heart rate. Normal rhythm. No murmurs, rubs, or gallops.    Abdomen:   bowel sounds present and normal in all 4 quadrants, soft Tender LLQ. No CVA tenderness          Assessment & Plan     1. LLQ pain C/w with previous episode of CT confirmed diverticulitis. Has actually improved a bit since yesterday.   2. Diverticulitis  - ciprofloxacin (CIPRO) 500 MG tablet; Take 1 tablet (500 mg total) by mouth 2 (two) times daily for 10 days.  Dispense: 20 tablet; Refill: 0 - metroNIDAZOLE (FLAGYL) 500 MG tablet; Take 1 tablet (500 mg total) by mouth 3 (three) times daily for 10 days.  Dispense: 30 tablet; Refill: 0       The entirety of the information documented in the History of Present Illness, Review of Systems and Physical Exam were personally obtained by me. Portions of this information were initially documented by the CMA and reviewed by me for thoroughness and accuracy.     Lelon Huh, MD  [place] [phone] (phone) [phone] (fax)  [place]

## 2022-04-19 NOTE — Telephone Encounter (Signed)
  Chief Complaint: abdominal pain Symptoms: Above Frequency: 2 days Pertinent Negatives: Patient denies Diarrhea, nausea vomiting, dark stools,  Disposition: '[]'$ ED /'[]'$ Urgent Care (no appt availability in office) / '[x]'$ Appointment(In office/virtual)/ '[]'$  [place] [facility]/ '[]'$ Home Care/ '[]'$ Refused Recommended Disposition /'[]'$ [place] [facility]/ '[]'$  Follow-up with PCP Additional Notes: Pt has sharp abdominal pain on the lower left side when he bends forward. When he lays back pain is minimal. Pt had diverticulitis in the past and thinks this may be that again. Pt also states he is recovering from a sinus infection.  Reason for Disposition  [1] MILD-MODERATE pain AND [2] constant AND [3] present > 2 hours  Answer Assessment - Initial Assessment Questions 1. LOCATION: "Where does it hurt?"      Left lower abdomen - hurts when he leans forward 2. RADIATION: "Does the pain shoot anywhere else?" (e.g., chest, back)     no 3. ONSET: "When did the pain begin?" (Minutes, hours or days ago)      Yesterday morning - stabbing pain 4. SUDDEN: "Gradual or sudden onset?"     Sudden 5. PATTERN "Does the pain come and go, or is it constant?"    - If it comes and goes: "How long does it last?" "Do you have pain now?"     (Note: Comes and goes means the pain is intermittent. It goes away completely between bouts.)    - If constant: "Is it getting better, staying the same, or getting worse?"      (Note: Constant means the pain never goes away completely; most serious pain is constant and gets worse.)      Pain is always there - but much worse when he leans forward 6. SEVERITY: "How bad is the pain?"  (e.g., Scale 1-10; mild, moderate, or severe)    - MILD (1-3): Doesn't interfere with normal activities, abdomen soft and not tender to touch.     - MODERATE (4-7): Interferes with normal activities or awakens from sleep, abdomen tender to touch.     - SEVERE (8-10): Excruciating pain, doubled over, unable to  do any normal activities.       1-10/10 7. RECURRENT SYMPTOM: "Have you ever had this type of stomach pain before?" If Yes, ask: "When was the last time?" and "What happened that time?"      Yes - diverticulitis 8. CAUSE: "What do you think is causing the stomach pain?"     Diverticulitis 9. RELIEVING/AGGRAVATING FACTORS: "What makes it better or worse?" (e.g., antacids, bending or twisting motion, bowel movement)     Laying back better leaning forward worse 10. OTHER SYMPTOMS: "Do you have any other symptoms?" (e.g., back pain, diarrhea, fever, urination pain, vomiting)       no  Protocols used: Abdominal Pain - Male-A-AH

## 2022-04-25 ENCOUNTER — Ambulatory Visit: Payer: Self-pay | Admitting: *Deleted

## 2022-04-25 ENCOUNTER — Ambulatory Visit (INDEPENDENT_AMBULATORY_CARE_PROVIDER_SITE_OTHER): Payer: BC Managed Care – PPO | Admitting: Physician Assistant

## 2022-04-25 VITALS — BP 136/76 | HR 58 | Temp 98.4°F | Wt 287.0 lb

## 2022-04-25 DIAGNOSIS — K5792 Diverticulitis of intestine, part unspecified, without perforation or abscess without bleeding: Secondary | ICD-10-CM | POA: Diagnosis not present

## 2022-04-25 NOTE — Telephone Encounter (Signed)
  Chief Complaint: recent diagnosis diverticulitis  Symptoms: LLQ pain, diarrhea, bloating- increased gas- was feeling better- now pain is back Frequency: Tuesday- feeling worse Pertinent Negatives: Patient denies fever Disposition: '[]'$ ED /'[]'$ Urgent Care (no appt availability in office) / '[x]'$ Appointment(In office/virtual)/ '[]'$  [place] [facility]/ '[]'$ Home Care/ '[]'$ Refused Recommended Disposition /'[]'$ [place]/ '[]'$  Follow-up with PCP Additional Notes: Patient is still taking antibiotic- was feeling better- now having pain again- appointment given for evaluation of changing symptoms

## 2022-04-25 NOTE — Telephone Encounter (Signed)
Reason for Disposition  [1] MODERATE pain (e.g., interferes with normal activities) AND [2] pain comes and goes (cramps) AND [3] present > 24 hours  (Exception: Pain with Vomiting or Diarrhea - see that Guideline.)  Answer Assessment - Initial Assessment Questions 1. LOCATION: "Where does it hurt?"      LLQ 2. RADIATION: "Does the pain shoot anywhere else?" (e.g., chest, back)     Bloating above belly button 3. ONSET: "When did the pain begin?" (Minutes, hours or days ago)      Started medication Saturday- pain subsided and now is back 4. SUDDEN: "Gradual or sudden onset?"     Gradual- eased and now back 5. PATTERN "Does the pain come and go, or is it constant?"    - If it comes and goes: "How long does it last?" "Do you have pain now?"     (Note: Comes and goes means the pain is intermittent. It goes away completely between bouts.)    - If constant: "Is it getting better, staying the same, or getting worse?"      (Note: Constant means the pain never goes away completely; most serious pain is constant and gets worse.)      Worst pain comes and goes- but does have lingering pain 6. SEVERITY: "How bad is the pain?"  (e.g., Scale 1-10; mild, moderate, or severe)    - MILD (1-3): Doesn't interfere with normal activities, abdomen soft and not tender to touch.     - MODERATE (4-7): Interferes with normal activities or awakens from sleep, abdomen tender to touch.     - SEVERE (8-10): Excruciating pain, doubled over, unable to do any normal activities.       moderate 7. RECURRENT SYMPTOM: "Have you ever had this type of stomach pain before?" If Yes, ask: "When was the last time?" and "What happened that time?"      Recent treatment for diverticulitis  8. CAUSE: "What do you think is causing the stomach pain?"     Still taking antibiotics 9. RELIEVING/AGGRAVATING FACTORS: "What makes it better or worse?" (e.g., antacids, bending or twisting motion, bowel movement)     Laying flat 10. OTHER  SYMPTOMS: "Do you have any other symptoms?" (e.g., back pain, diarrhea, fever, urination pain, vomiting)       Diarrhea , nausea  Protocols used: Abdominal Pain - Male-A-AH

## 2022-04-25 NOTE — Progress Notes (Signed)
[place],acting as a Education administrator for [employer], PA-C.,have documented all relevant documentation on the behalf of Mardene Speak, PA-C,as directed by  [employer], PA-C while in the presence of [employer], PA-C.     Established patient visit   Patient: Eduardo Poole   DOB: December 28, 1970   52 y.o. Male  MRN: [id number] Visit Date: 04/25/2022  Today's healthcare provider: Mardene Speak, PA-C   CC: continued abdominal pain  Subjective    HPI  Patient is a 52 year old male who presents for continued abdominal pain.  He was last seen for this on 04/19/22.  He has history of diverticulitis and was treated on 04/19/22 with Ciprofloxacin and Metronidazole.   Patient states he started to feel better after being seen and starting treatment.  However for the last 2-3 days he has had increase in pain again.  Diarrhea 5-6 times per day.  He notes that the pain is no longer isolated to just the LLQ but all over the abdomen now.  He notices after eating the pain is worse.  He also relates increase in gas.  He denies any fever.   Medications: Outpatient Medications Prior to Visit  Medication Sig   amLODipine (NORVASC) 5 MG tablet TAKE 1 TABLET (5 MG TOTAL) BY MOUTH DAILY.   aspirin EC 81 MG EC tablet Take 1 tablet (81 mg total) by mouth daily. (Patient taking differently: Take 81 mg by mouth at bedtime.)   atorvastatin (LIPITOR) 80 MG tablet Take 1 tablet (80 mg total) by mouth daily.   ciprofloxacin (CIPRO) 500 MG tablet Take 1 tablet (500 mg total) by mouth 2 (two) times daily for 10 days.   clopidogrel (PLAVIX) 75 MG tablet TAKE 1 TABLET BY MOUTH EVERY DAY   Halobetasol Propionate (LEXETTE) 0.05 % FOAM Apply 1 application topically See admin instructions. Apply to affected areas at back of neck daily on Tuesday, Thursday, Saturdays weekly. Can also apply daily as needed for eczema at left arm see handout.   metoprolol succinate (TOPROL-XL) 100 MG 24 hr tablet TAKE 1 TABLET BY MOUTH DAILY.  TAKE WITH OR IMMEDIATELY FOLLOWING A MEAL.   metroNIDAZOLE (FLAGYL) 500 MG tablet Take 1 tablet (500 mg total) by mouth 3 (three) times daily for 10 days.   nitroGLYCERIN (NITROSTAT) 0.4 MG SL tablet For chest pain, tightness, or pressure. While sitting, place 1 tablet under tongue. May be used every 5 minutes as needed, for up to 15 minutes. Do not use more than 3 tablets.   sertraline (ZOLOFT) 100 MG tablet TAKE 2 TABLETS BY MOUTH EVERY DAY   tamsulosin (FLOMAX) 0.4 MG CAPS capsule TAKE 1 CAPSULE BY MOUTH EVERY DAY   tiZANidine (ZANAFLEX) 4 MG tablet tizanidine 4 mg tablet  TAKE 1 TABLET BY MOUTH EVERY 6 HOURS AS NEEDED   valsartan (DIOVAN) 320 MG tablet Take 1 tablet (320 mg total) by mouth daily.   benzonatate (TESSALON) 100 MG capsule Take 1 capsule (100 mg total) by mouth 2 (two) times daily as needed for cough.   gabapentin (NEURONTIN) 300 MG capsule gabapentin 300 mg capsule  TAKE 1 CAPSULE BY MOUTH TWICE A DAY   Semaglutide-Weight Management (WEGOVY) 0.25 MG/0.5ML SOAJ 0.'25mg'$  once a week x 4. For BMI =42   No facility-administered medications prior to visit.    Review of Systems  Gastrointestinal:  Positive for abdominal pain, diarrhea and nausea. Negative for blood in stool, constipation and vomiting.  Musculoskeletal:  Positive for back pain.  Objective    BP 136/76 (BP Location: Left Arm, Patient Position: Sitting, Cuff Size: Large)   Pulse (!) 58   Temp 98.4 F (36.9 C) (Oral)   Wt 287 lb (130.2 kg)   SpO2 97%   BMI 41.18 kg/m    Physical Exam Vitals reviewed.  Constitutional:      General: He is not in acute distress.    Appearance: Normal appearance. He is not diaphoretic.  HENT:     Head: Normocephalic and atraumatic.  Eyes:     General: No scleral icterus.    Conjunctiva/sclera: Conjunctivae normal.  Cardiovascular:     Rate and Rhythm: Normal rate and regular rhythm.     Pulses: Normal pulses.     Heart sounds: Normal heart sounds. No murmur  heard. Pulmonary:     Effort: Pulmonary effort is normal. No respiratory distress.     Breath sounds: Normal breath sounds. No wheezing or rhonchi.  Abdominal:     General: There is distension.     Palpations: There is no mass.     Tenderness: There is abdominal tenderness (Left side of abdoment, worse at LLQ). There is no right CVA tenderness, left CVA tenderness, guarding or rebound.     Hernia: No hernia is present.  Musculoskeletal:     Cervical back: Neck supple.     Right lower leg: No edema.     Left lower leg: No edema.  Lymphadenopathy:     Cervical: No cervical adenopathy.  Skin:    General: Skin is warm and dry.     Findings: No rash.  Neurological:     Mental Status: He is alert and oriented to person, place, and time. Mental status is at baseline.  Psychiatric:        Behavior: Behavior normal.        Thought Content: Thought content normal.        Judgment: Judgment normal.       No results found for any visits on 04/25/22.  Assessment & Plan     Diverticulitis - Plan: Comprehensive metabolic panel, CBC with Differential/Platelet, Sed Rate (ESR) Worsening since his last visit. He is still taking his abx: metronidazole '500mg'$  and ciprofloxacin '500mg'$  BID for 10 dyas Advised to continue to take medication but watch his diet Discussed a diet for diverticulitis , adequate hydration and his treatment plan Ordered labs: cbc, cmp, sed rate and Hold on CT abdomen Will reassess after receiving lab results  Return in about 1 week (around 05/02/2022) for diverticulitis FU.      The patient was advised to call back or seek an in-person evaluation if the symptoms worsen or if the condition fails to improve as anticipated.  I discussed the assessment and treatment plan with the patient. The patient was provided an opportunity to ask questions and all were answered. The patient agreed with the plan and demonstrated an understanding of the instructions.  I, Mardene Speak,  PA-C have reviewed all documentation for this visit. The documentation on 04/25/2022 for the exam, diagnosis, procedures, and orders are all accurate and complete.  Mardene Speak, [facility], [place] [phone] (phone) [phone] (fax)   [place]

## 2022-04-26 LAB — COMPREHENSIVE METABOLIC PANEL
ALT: 69 IU/L — ABNORMAL HIGH (ref 0–44)
AST: 45 IU/L — ABNORMAL HIGH (ref 0–40)
Albumin/Globulin Ratio: 1.8 (ref 1.2–2.2)
Albumin: 4.4 g/dL (ref 3.8–4.9)
Alkaline Phosphatase: 84 IU/L (ref 44–121)
BUN/Creatinine Ratio: 12 (ref 9–20)
BUN: 14 mg/dL (ref 6–24)
Bilirubin Total: 0.4 mg/dL (ref 0.0–1.2)
CO2: 23 mmol/L (ref 20–29)
Calcium: 9.8 mg/dL (ref 8.7–10.2)
Chloride: 103 mmol/L (ref 96–106)
Creatinine, Ser: 1.15 mg/dL (ref 0.76–1.27)
Globulin, Total: 2.5 g/dL (ref 1.5–4.5)
Glucose: 104 mg/dL — ABNORMAL HIGH (ref 70–99)
Potassium: 5 mmol/L (ref 3.5–5.2)
Sodium: 141 mmol/L (ref 134–144)
Total Protein: 6.9 g/dL (ref 6.0–8.5)
eGFR: 77 mL/min/{1.73_m2} (ref >59)

## 2022-04-26 LAB — CBC WITH DIFFERENTIAL/PLATELET
Basophils Absolute: 0.1 10*3/uL (ref 0.0–0.2)
Basos: 1 %
EOS (ABSOLUTE): 0.3 10*3/uL (ref 0.0–0.4)
Eos: 4 %
Hematocrit: 43.6 % (ref 37.5–51.0)
Hemoglobin: 14.6 g/dL (ref 13.0–17.7)
Immature Grans (Abs): 0 10*3/uL (ref 0.0–0.1)
Immature Granulocytes: 0 %
Lymphocytes Absolute: 2.2 10*3/uL (ref 0.7–3.1)
Lymphs: 27 %
MCH: 30 pg (ref 26.6–33.0)
MCHC: 33.5 g/dL (ref 31.5–35.7)
MCV: 90 fL (ref 79–97)
Monocytes Absolute: 0.6 10*3/uL (ref 0.1–0.9)
Monocytes: 7 %
Neutrophils Absolute: 4.9 10*3/uL (ref 1.4–7.0)
Neutrophils: 61 %
Platelets: 310 10*3/uL (ref 150–450)
RBC: 4.87 x10E6/uL (ref 4.14–5.80)
RDW: 12.5 % (ref 11.6–15.4)
WBC: 8 10*3/uL (ref 3.4–10.8)

## 2022-04-26 LAB — SEDIMENTATION RATE: Sed Rate: 11 mm/hr (ref 0–30)

## 2022-04-26 NOTE — Progress Notes (Signed)
Please, let pt know that all labs are Wnl including WBC/no infection except elevated liver enzymes , could be due to fatty liver. He might want to repeat the labs in 3-6 mo. Meanwhile, I would recommend to weight loss via healthy low-carb diet and daily exercise as tolerated. If he has any questions, he can message me via my chart  Mardene Speak, [facility], [place] [phone])

## 2022-04-27 ENCOUNTER — Encounter: Payer: Self-pay | Admitting: Physician Assistant

## 2022-05-03 ENCOUNTER — Ambulatory Visit: Payer: BC Managed Care – PPO | Admitting: Physician Assistant

## 2022-05-19 ENCOUNTER — Other Ambulatory Visit: Payer: Self-pay | Admitting: Family Medicine

## 2022-05-19 DIAGNOSIS — F32A Depression, unspecified: Secondary | ICD-10-CM

## 2022-06-04 ENCOUNTER — Other Ambulatory Visit: Payer: Self-pay | Admitting: Urology

## 2022-06-20 ENCOUNTER — Other Ambulatory Visit: Payer: Self-pay | Admitting: Cardiovascular Disease

## 2022-09-10 ENCOUNTER — Other Ambulatory Visit: Payer: Self-pay | Admitting: Cardiovascular Disease

## 2022-09-17 ENCOUNTER — Other Ambulatory Visit: Payer: Self-pay | Admitting: Cardiovascular Disease

## 2022-11-05 ENCOUNTER — Encounter: Payer: Self-pay | Admitting: Family Medicine

## 2022-11-05 ENCOUNTER — Telehealth: Payer: Self-pay | Admitting: Family Medicine

## 2022-11-05 ENCOUNTER — Ambulatory Visit (INDEPENDENT_AMBULATORY_CARE_PROVIDER_SITE_OTHER): Payer: BC Managed Care – PPO | Admitting: Family Medicine

## 2022-11-05 VITALS — BP 143/80 | HR 57 | Ht 70.0 in | Wt 284.0 lb

## 2022-11-05 DIAGNOSIS — Z23 Encounter for immunization: Secondary | ICD-10-CM

## 2022-11-05 DIAGNOSIS — F419 Anxiety disorder, unspecified: Secondary | ICD-10-CM

## 2022-11-05 DIAGNOSIS — G4733 Obstructive sleep apnea (adult) (pediatric): Secondary | ICD-10-CM

## 2022-11-05 DIAGNOSIS — I2510 Atherosclerotic heart disease of native coronary artery without angina pectoris: Secondary | ICD-10-CM | POA: Diagnosis not present

## 2022-11-05 DIAGNOSIS — I10 Essential (primary) hypertension: Secondary | ICD-10-CM

## 2022-11-05 DIAGNOSIS — R739 Hyperglycemia, unspecified: Secondary | ICD-10-CM

## 2022-11-05 DIAGNOSIS — R002 Palpitations: Secondary | ICD-10-CM | POA: Diagnosis not present

## 2022-11-05 DIAGNOSIS — Z0001 Encounter for general adult medical examination with abnormal findings: Secondary | ICD-10-CM

## 2022-11-05 DIAGNOSIS — I493 Ventricular premature depolarization: Secondary | ICD-10-CM | POA: Diagnosis not present

## 2022-11-05 DIAGNOSIS — Z125 Encounter for screening for malignant neoplasm of prostate: Secondary | ICD-10-CM

## 2022-11-05 DIAGNOSIS — E785 Hyperlipidemia, unspecified: Secondary | ICD-10-CM | POA: Diagnosis not present

## 2022-11-05 DIAGNOSIS — Z0000 Encounter for general adult medical examination without abnormal findings: Secondary | ICD-10-CM

## 2022-11-05 MED ORDER — WEGOVY 0.25 MG/0.5ML ~~LOC~~ SOAJ: SUBCUTANEOUS | 2 refills | Status: DC

## 2022-11-05 NOTE — Progress Notes (Unsigned)
Complete physical exam   Patient: Eduardo Poole   DOB: 18-Feb-1971   52 y.o. Male  MRN: [id number] Visit Date: 11/05/2022  Today's healthcare provider: Mila Merry, MD   Chief Complaint  Patient presents with   Annual Exam   Subjective    Eduardo Poole is a 52 y.o. male who presents today for a complete physical exam.  He reports consuming a  regular  diet.Stays physically active He generally feels well. He reports sleeping fairly well. He does not have additional problems to discuss today.  He was prescribed [facility] for weight loss last year which he reports was approved by his insurance, but he was never able to get his pharmacy to dispense the medication, apparently due to the shortage at the time.   Lab Results  Component Value Date   CHOL 128 10/31/2021   HDL 32 (L) 10/31/2021   LDLCALC 75 10/31/2021   TRIG 113 10/31/2021   CHOLHDL 4.0 10/31/2021   Lab Results  Component Value Date   NA 141 04/25/2022   CL 103 04/25/2022   K 5.0 04/25/2022   CO2 23 04/25/2022   BUN 14 04/25/2022   CREATININE 1.15 04/25/2022   EGFR 77 04/25/2022   CALCIUM 9.8 04/25/2022   ALBUMIN 4.4 04/25/2022   GLUCOSE 104 (H) 04/25/2022   Lab Results  Component Value Date   HGBA1C 5.9 (H) 10/31/2021     Past Medical History:  Diagnosis Date   CAD (coronary artery disease)    a. 07/2014 Inf STEMI/PTCA:  LM nl, LAD nl, LCX nl, RCA ulcerated plaque (PTCA only);  b. 10/2014 MV: intermediate risk, ? inflat isch/scar;  c. 10/2014 Cath: Nl cors w/o RCA restenosis.   Diastolic dysfunction    a. 07/2014 Echo: EF 60-65%, Gr1 DD.   DVT of lower extremity (deep venous thrombosis) (HCC)    a. ~ 2009, per pt was on lovenox/coumadin   GERD (gastroesophageal reflux disease)    History of dysplastic nevus 12/07/2018   right post waistline/moderate, left low back/moderate, left mid back lateral near side/moderate    Hyperlipidemia    Hypertension    Hypertensive heart disease    ST elevation  myocardial infarction (STEMI) involving right coronary artery with complication (HCC) 07/23/2014   TIA (transient ischemic attack) 07/23/2014   Past Surgical History:  Procedure Laterality Date   ARTHROPLASTY Right 07/09/2010   CARDIAC CATHETERIZATION N/A 07/23/2014   Procedure: Left Heart Cath;  Surgeon: Lennette Bihari, MD;  Location: [facility] INVASIVE CV LAB;  Service: Cardiovascular;  Laterality: N/A;   CARDIAC CATHETERIZATION N/A 07/23/2014   Procedure: Coronary Balloon Angioplasty;  Surgeon: Lennette Bihari, MD;  Location: [facility] INVASIVE CV LAB;  Service: Cardiovascular;  Laterality: N/A;   CARDIAC CATHETERIZATION N/A 11/10/2014   Procedure: Left Heart Cath and Coronary Angiography;  Surgeon: Lennette Bihari, MD;  Location: [facility] INVASIVE CV LAB;  Service: Cardiovascular;  Laterality: N/A;   CERVICAL DISC SURGERY  2015   COLONOSCOPY     COLONOSCOPY     COLONOSCOPY WITH PROPOFOL N/A 11/23/2020   Procedure: COLONOSCOPY WITH PROPOFOL;  Surgeon: Pasty Spillers, MD;  Location: [facility] ENDOSCOPY;  Service: Endoscopy;  Laterality: N/A;   JOINT REPLACEMENT Right    Right knee   MENISCUS REPAIR Right 11/03/1995   Meniscus Lateral Tear: repaired by Dr. Hyacinth Meeker at [facility]   NASAL SINUS SURGERY     Deviated Septum   REPLACEMENT TOTAL KNEE Right 2011   [facility]   Social History   Socioeconomic History   Marital status: Married    Spouse name: Not on file   Number of children: 2   Years of education: 12   Highest education level: Not on file  Occupational History   Occupation: Employed    Comment: Works at [employer]  Tobacco Use   Smoking status: Never   Smokeless tobacco: Former    Types: Engineer, drilling   Vaping status: Never Used  Substance and Sexual Activity   Alcohol use: Yes    Alcohol/week: 3.0 standard drinks of alcohol    Types: 2 Cans of beer, 1 Standard drinks or equivalent per week    Comment: none last 24 hrs   Drug use: No   Sexual activity:  Yes  Other Topics Concern   Not on file  Social History Narrative   Not on file   Social Determinants of Health   Financial Resource Strain: Low Risk  (11/02/2021)   Overall Financial Resource Strain (CARDIA)    Difficulty of Paying Living Expenses: Not hard at all  Food Insecurity: No Food Insecurity (11/02/2021)   Hunger Vital Sign    Worried About Running Out of Food in the Last Year: Never true    Ran Out of Food in the Last Year: Never true  Transportation Needs: No Transportation Needs (11/02/2021)   PRAPARE - Administrator, Civil Service (Medical): No    Lack of Transportation (Non-Medical): No  Physical Activity: Inactive (11/02/2021)   Exercise Vital Sign    Days of Exercise per Week: 0 days    Minutes of Exercise per Session: 0 min  Stress: Not on file  Social Connections: Not on file  Intimate Partner Violence: Not At Risk (11/02/2021)   Humiliation, Afraid, Rape, and Kick questionnaire    Fear of Current or Ex-Partner: No    Emotionally Abused: No    Physically Abused: No    Sexually Abused: No   Family Status  Relation Name Status   Mother  Alive   Father  Alive   Sister  Alive   PGF  (Not Specified)   MGF  Deceased  No partnership data on file   Family History  Problem Relation Age of Onset   Hypertension Mother    Arthritis Mother    Diverticulitis Mother    Gout Father    Autoimmune disease Father    Arthritis Sister    Diabetes Paternal Grandfather    Diabetes Maternal Grandfather    Cancer Maternal Grandfather        unknown   No Known Allergies  Patient Care Team: Malva Limes, MD as PCP - General (Family Medicine) Lennette Bihari, MD as PCP - Cardiology (Cardiology) Jinny Sanders, MD as Referring Physician (Orthopedic Surgery) Linus Salmons, MD (Unknown Physician Specialty) Lennette Bihari, MD as Consulting Physician (Cardiology) Pasty Spillers, MD (Inactive) as Consulting Physician (Gastroenterology)    Medications: Outpatient Medications Prior to Visit  Medication Sig   amLODipine (NORVASC) 5 MG tablet TAKE 1 TABLET (5 MG TOTAL) BY MOUTH DAILY.   aspirin EC 81 MG EC tablet Take 1 tablet (81 mg total) by mouth daily. (Patient taking differently: Take 81 mg by mouth at bedtime.)   atorvastatin (LIPITOR) 80 MG tablet Take 1 tablet (80 mg total) by mouth daily. Please schedule an appointment for future refills. Thank you   clopidogrel (PLAVIX) 75 MG tablet TAKE 1 TABLET BY MOUTH EVERY  DAY   Halobetasol Propionate (LEXETTE) 0.05 % FOAM Apply 1 application topically See admin instructions. Apply to affected areas at back of neck daily on Tuesday, Thursday, Saturdays weekly. Can also apply daily as needed for eczema at left arm see handout.   metoprolol succinate (TOPROL-XL) 100 MG 24 hr tablet TAKE 1 TABLET BY MOUTH DAILY. TAKE WITH OR IMMEDIATELY FOLLOWING A MEAL.   nitroGLYCERIN (NITROSTAT) 0.4 MG SL tablet For chest pain, tightness, or pressure. While sitting, place 1 tablet under tongue. May be used every 5 minutes as needed, for up to 15 minutes. Do not use more than 3 tablets.   sertraline (ZOLOFT) 100 MG tablet TAKE 2 TABLETS BY MOUTH EVERY DAY   tamsulosin (FLOMAX) 0.4 MG CAPS capsule TAKE 1 CAPSULE BY MOUTH EVERY DAY   tiZANidine (ZANAFLEX) 4 MG tablet tizanidine 4 mg tablet  TAKE 1 TABLET BY MOUTH EVERY 6 HOURS AS NEEDED   valsartan (DIOVAN) 320 MG tablet Take 1 tablet (320 mg total) by mouth daily.   No facility-administered medications prior to visit.    Review of Systems  Constitutional:  Negative for chills, diaphoresis and fever.  HENT:  Negative for congestion, ear discharge, ear pain, hearing loss, nosebleeds, sore throat and tinnitus.   Eyes:  Negative for photophobia, pain, discharge and redness.  Respiratory:  Negative for cough, shortness of breath, wheezing and stridor.   Cardiovascular:  Negative for chest pain, palpitations and leg swelling.  Gastrointestinal:   Negative for abdominal pain, blood in stool, constipation, diarrhea, nausea and vomiting.  Endocrine: Negative for polydipsia.  Genitourinary:  Negative for dysuria, flank pain, frequency, hematuria and urgency.  Musculoskeletal:  Negative for back pain, myalgias and neck pain.  Skin:  Negative for rash.  Allergic/Immunologic: Negative for environmental allergies.  Neurological:  Negative for dizziness, tremors, seizures, weakness and headaches.  Hematological:  Does not bruise/bleed easily.  Psychiatric/Behavioral:  Negative for hallucinations and suicidal ideas. The patient is not nervous/anxious.       Objective    BP (!) 143/80 (BP Location: Left Arm, Patient Position: Sitting, Cuff Size: Large)   Pulse (!) 57   Ht 5\' 10"  (1.778 m)   Wt 284 lb (128.8 kg)   BMI 40.75 kg/m    Physical Exam   General Appearance:    Severely obese male. Alert, cooperative, in no acute distress, appears stated age  Head:    Normocephalic, without obvious abnormality, atraumatic  Eyes:    PERRL, conjunctiva/corneas clear, EOM's intact, fundi    benign, both eyes       Ears:    Normal TM's and external ear canals, both ears  Nose:   Nares normal, septum midline, mucosa normal, no drainage   or sinus tenderness  Throat:   Lips, mucosa, and tongue normal; teeth and gums normal  Neck:   Supple, symmetrical, trachea midline, no adenopathy;       thyroid:  No enlargement/tenderness/nodules; no carotid   bruit or JVD  Back:     Symmetric, no curvature, ROM normal, no CVA tenderness  Lungs:     Clear to auscultation bilaterally, respirations unlabored  Chest wall:    No tenderness or deformity  Heart:    Bradycardic. Normal rhythm. No murmurs, rubs, or gallops.  S1 and S2 normal  Abdomen:     Soft, non-tender, bowel sounds active all four quadrants,    no masses, no organomegaly  Genitalia:    deferred  Rectal:    deferred  Extremities:  All extremities are intact. No cyanosis or edema  Pulses:    2+ and symmetric all extremities  Skin:   Skin color, texture, turgor normal, no rashes or lesions  Lymph nodes:   Cervical, supraclavicular, and axillary nodes normal  Neurologic:   CNII-XII intact. Normal strength, sensation and reflexes      throughout     Last depression screening scores    11/05/2022    8:40 AM 04/19/2022    4:31 PM 10/30/2021    2:20 PM  PHQ 2/9 Scores  PHQ - 2 Score 2 3 5   PHQ- 9 Score 11 11 16    Last fall risk screening    04/19/2022    4:31 PM  Fall Risk   Falls in the past year? 0  Number falls in past yr: 0  Injury with Fall? 0   Last Audit-C alcohol use screening    04/19/2022    4:31 PM  Alcohol Use Disorder Test (AUDIT)  1. How often do you have a drink containing alcohol? 2  2. How many drinks containing alcohol do you have on a typical day when you are drinking? 4  3. How often do you have six or more drinks on one occasion? 3  AUDIT-C Score 9   A score of 3 or more in women, and 4 or more in men indicates increased risk for alcohol abuse, EXCEPT if all of the points are from question 1     Assessment & Plan    Routine Health Maintenance and Physical Exam  Exercise Activities and Dietary recommendations  Goals   None     Immunization History  Administered Date(s) Administered   Influenza, Seasonal, Injecte, Preservative Fre 11/05/2022   Influenza,inj,Quad PF,6+ Mos 12/21/2012, 12/02/2014, 04/23/2019, 10/12/2019, 10/25/2020, 10/30/2021   Influenza-Unspecified 12/19/2017   Td 06/19/2010   Tdap 10/12/2019    Health Maintenance  Topic Date Due   COVID-19 Vaccine (1) Never done   HIV Screening  Never done   Zoster Vaccines- Shingrix (1 of 2) Never done   Colonoscopy  11/24/2023   DTaP/Tdap/Td (3 - Td or Tdap) 10/11/2029   INFLUENZA VACCINE  Completed   Hepatitis C Screening  Completed   HPV VACCINES  Aged Out    Discussed health benefits of physical activity, and encouraged him to engage in regular exercise appropriate for his  age and condition.    2. Prostate cancer screening  - PSA Total (Reflex To Free) ([facility] only)  3. Flu vaccine need  - Flu vaccine trivalent PF, 6mos and older(Flulaval,Afluria,Fluarix,Fluzone)  4. Need for shingles vaccine - Varicella-zoster vaccine subcutaneous   5. Primary hypertension Doing well on current medications.   6. Coronary artery disease involving native coronary artery of native heart without angina pectoris Asymptomatic. Compliant with medication.  Continue aggressive risk factor modification.  Follow up cardiology as scheduled.  - CBC - Comprehensive metabolic panel - Lipid panel  7. Anxiety Continue sertraline which he feels is working well.   8. OSA on CPAP   9. Hyperglycemia  - Hemoglobin A1c  10. Morbid obesity (HCC) Never started [facility] last year due to manufacturer shortage.   - Semaglutide-Weight Management (WEGOVY) 0.25 MG/0.5ML SOAJ; Take 0.25mg  weekly for 4 weeks then increase to 0.5mg  weekly  Dispense: 2 mL; Refill: 2        Mila Merry, MD  [facility] [phone] (phone) [phone] (fax)  [facility]

## 2022-11-05 NOTE — Telephone Encounter (Signed)
Covermymeds is requesting prior authorization Key: [id number] Name: Eduardo Poole 0.25 MG/0.5ML auto injectors

## 2022-11-11 NOTE — Telephone Encounter (Signed)
Information regarding your request This request cannot be completed over ePA. To complete the PA, please call the plan directly at [phone].  Called the plan to the number provided and started PA on the phone.  Outcome: Approved.  Called [facility] pharmacy and informed of approval.

## 2022-11-18 ENCOUNTER — Telehealth (INDEPENDENT_AMBULATORY_CARE_PROVIDER_SITE_OTHER): Payer: BC Managed Care – PPO | Admitting: Family Medicine

## 2022-11-18 ENCOUNTER — Ambulatory Visit: Payer: Self-pay | Admitting: *Deleted

## 2022-11-18 DIAGNOSIS — R197 Diarrhea, unspecified: Secondary | ICD-10-CM

## 2022-11-18 DIAGNOSIS — K5792 Diverticulitis of intestine, part unspecified, without perforation or abscess without bleeding: Secondary | ICD-10-CM

## 2022-11-18 DIAGNOSIS — R1032 Left lower quadrant pain: Secondary | ICD-10-CM

## 2022-11-18 MED ORDER — CIPROFLOXACIN HCL 500 MG PO TABS: 500.0000 mg | ORAL_TABLET | Freq: Two times a day (BID) | ORAL | 0 refills | Status: AC

## 2022-11-18 MED ORDER — METRONIDAZOLE 500 MG PO TABS: 500.0000 mg | ORAL_TABLET | Freq: Two times a day (BID) | ORAL | 0 refills | Status: AC

## 2022-11-18 MED ORDER — HYOSCYAMINE SULFATE 0.125 MG SL SUBL: 0.1250 mg | SUBLINGUAL_TABLET | SUBLINGUAL | 0 refills | Status: DC | PRN

## 2022-11-18 NOTE — Telephone Encounter (Signed)
  Chief Complaint: Abd pain, fever 102, body aches, negative Covid test Symptoms: Bad diarrhea.   "I can't leave the house it's so bad".   I have diverticulitis so that may be what's going on. Frequency: Started yesterday with abd pain and diarrhea. Pertinent Negatives: Patient denies N/A Disposition: [] ED /[] Urgent Care (no appt availability in office) / [x] Appointment(In office/virtual)/ []  [place] [facility]/ [] Home Care/ [] Refused Recommended Disposition /[] [place] [facility]/ [x]  Follow-up with PCP Additional Notes: There are no available appts with any of the providers at [facility].   I'm sending a message to see if he can be worked in for a video visit with any of the providers.  He is having diarrhea so bad he can't leave the house.

## 2022-11-18 NOTE — Telephone Encounter (Signed)
Reason for Disposition  [1] MILD-MODERATE pain AND [2] constant AND [3] present > 2 hours    Has diverticulitis  Answer Assessment - Initial Assessment Questions 1. LOCATION: "Where does it hurt?"      I'm having abd and fever 102, cold chills, my body hurts, diarrhea and can't eat. 2. RADIATION: "Does the pain shoot anywhere else?" (e.g., chest, back)     Lower left side at my colon to my belly button 3. ONSET: "When did the pain begin?" (Minutes, hours or days ago)      Yesterday with cold chills.   I ate breakfast yesterday and I got feeling worse yesterday.   Covid negative.   I have diverticulitis.    4. SUDDEN: "Gradual or sudden onset?"     Not asked 5. PATTERN "Does the pain come and go, or is it constant?"    - If it comes and goes: "How long does it last?" "Do you have pain now?"     (Note: Comes and goes means the pain is intermittent. It goes away completely between bouts.)    - If constant: "Is it getting better, staying the same, or getting worse?"      (Note: Constant means the pain never goes away completely; most serious pain is constant and gets worse.)      Abd pain in constant 6. SEVERITY: "How bad is the pain?"  (e.g., Scale 1-10; mild, moderate, or severe)    - MILD (1-3): Doesn't interfere with normal activities, abdomen soft and not tender to touch.     - MODERATE (4-7): Interferes with normal activities or awakens from sleep, abdomen tender to touch.     - SEVERE (8-10): Excruciating pain, doubled over, unable to do any normal activities.       Moderate but having diarrhea so bad he can't come in 7. RECURRENT SYMPTOM: "Have you ever had this type of stomach pain before?" If Yes, ask: "When was the last time?" and "What happened that time?"      Yes   I have diverticulitis 8. CAUSE: "What do you think is causing the stomach pain?"     Diverticulitis 9. RELIEVING/AGGRAVATING FACTORS: "What makes it better or worse?" (e.g., antacids, bending or twisting motion, bowel  movement)     Moving around causes the diarrhea to hit. 10. OTHER SYMPTOMS: "Do you have any other symptoms?" (e.g., back pain, diarrhea, fever, urination pain, vomiting)       Poor appetite, body aches, fever 102-103.   Using Tylenol  Protocols used: Abdominal Pain - Male-A-AH

## 2022-11-18 NOTE — Progress Notes (Signed)
MyChart Video Visit    Virtual Visit via Video Note   This format is felt to be most appropriate for this patient at this time. Physical exam was limited by quality of the video and audio technology used for the visit.   Patient location: home Provider location: bfp  I discussed the limitations of evaluation and management by telemedicine and the availability of in person appointments. The patient expressed understanding and agreed to proceed.  Patient: Eduardo Poole   DOB: 1970-02-28   52 y.o. Male  MRN: [id number] Visit Date: 11/18/2022  Today's healthcare provider: Mila Merry, MD   Chief Complaint  Patient presents with   Medical Management of Chronic Issues    Symptoms started yesterday, woke up with cold chills, became worse throughout the day, cramping, no appetite, drinking plenty fluids, abdominal pain on left side, prior treatment tylenol   Subjective    Discussed the use of AI scribe software for clinical note transcription with the patient, who gave verbal consent to proceed.  History of Present Illness   The patient, with a history of recurrent diverticulitis, presented with a one-day history of progressive abdominal pain, fever, and inability to eat. The pain, located on the left side from the hip to the belly button, was described as severe, reaching a level 10 on a pain scale during bowel movements. Accompanying the pain was a fever that peaked at 103 degrees Fahrenheit the previous night and was 99.5 degrees at the time of the conversation. Despite the fever, the patient denied any respiratory symptoms and had a negative COVID-19 test.  The patient reported increased frequency of bowel movements, which were associated with severe cramping. However, there was no blood in the stool. Despite feeling nauseous, the patient had not vomited and was able to keep down fluids, including water and Gatorade.  The patient's symptoms were similar to previous episodes of  diverticulitis, with the exception of the abdominal cramping. The patient had been managing the fever with over-the-counter medication and hydration.       Medications: Outpatient Medications Prior to Visit  Medication Sig   amLODipine (NORVASC) 5 MG tablet TAKE 1 TABLET (5 MG TOTAL) BY MOUTH DAILY.   aspirin EC 81 MG EC tablet Take 1 tablet (81 mg total) by mouth daily. (Patient taking differently: Take 81 mg by mouth at bedtime.)   atorvastatin (LIPITOR) 80 MG tablet Take 1 tablet (80 mg total) by mouth daily. Please schedule an appointment for future refills. Thank you   clopidogrel (PLAVIX) 75 MG tablet TAKE 1 TABLET BY MOUTH EVERY DAY   Halobetasol Propionate (LEXETTE) 0.05 % FOAM Apply 1 application topically See admin instructions. Apply to affected areas at back of neck daily on Tuesday, Thursday, Saturdays weekly. Can also apply daily as needed for eczema at left arm see handout.   metoprolol succinate (TOPROL-XL) 100 MG 24 hr tablet TAKE 1 TABLET BY MOUTH DAILY. TAKE WITH OR IMMEDIATELY FOLLOWING A MEAL.   nitroGLYCERIN (NITROSTAT) 0.4 MG SL tablet For chest pain, tightness, or pressure. While sitting, place 1 tablet under tongue. May be used every 5 minutes as needed, for up to 15 minutes. Do not use more than 3 tablets.   Semaglutide-Weight Management (WEGOVY) 0.25 MG/0.5ML SOAJ Take 0.25mg  weekly for 4 weeks then increase to 0.5mg  weekly   sertraline (ZOLOFT) 100 MG tablet TAKE 2 TABLETS BY MOUTH EVERY DAY   tamsulosin (FLOMAX) 0.4 MG CAPS capsule TAKE 1 CAPSULE BY MOUTH EVERY DAY  tiZANidine (ZANAFLEX) 4 MG tablet tizanidine 4 mg tablet  TAKE 1 TABLET BY MOUTH EVERY 6 HOURS AS NEEDED   valsartan (DIOVAN) 320 MG tablet Take 1 tablet (320 mg total) by mouth daily.   No facility-administered medications prior to visit.      Objective    There were no vitals taken for this visit.    Physical Exam  Awake, alert, oriented x 3. In no apparent distress      Assessment & Plan         Diverticulitis Acute onset of left lower quadrant abdominal pain, fever, and diarrhea. Similar to previous episodes. No dyspnea or hematochezia. Able to tolerate oral intake. -Start Ciprofloxacin and Metronidazole (Flagyl) as per previous successful treatment. -Add Levsin for abdominal cramping. -Encourage hydration with water and electrolyte-rich fluids like Gatorade. -If unable to tolerate oral antibiotics or if symptoms worsen, patient should present to the emergency department for further evaluation and possible IV antibiotics.    No follow-ups on file.     I discussed the assessment and treatment plan with the patient. The patient was provided an opportunity to ask questions and all were answered. The patient agreed with the plan and demonstrated an understanding of the instructions.   The patient was advised to call back or seek an in-person evaluation if the symptoms worsen or if the condition fails to improve as anticipated.  I provided 10 minutes of non-face-to-face time during this encounter.    Mila Merry, MD [facility] [phone] (phone) [phone] (fax)  [facility]

## 2022-11-25 ENCOUNTER — Ambulatory Visit: Payer: Self-pay

## 2022-11-25 ENCOUNTER — Ambulatory Visit (INDEPENDENT_AMBULATORY_CARE_PROVIDER_SITE_OTHER): Payer: BC Managed Care – PPO | Admitting: Family Medicine

## 2022-11-25 VITALS — BP 150/84 | HR 66 | Ht 70.0 in | Wt 280.9 lb

## 2022-11-25 DIAGNOSIS — R197 Diarrhea, unspecified: Secondary | ICD-10-CM | POA: Diagnosis not present

## 2022-11-25 DIAGNOSIS — R1032 Left lower quadrant pain: Secondary | ICD-10-CM | POA: Diagnosis not present

## 2022-11-25 MED ORDER — HYDROCODONE-ACETAMINOPHEN 5-325 MG PO TABS: 1.0000 | ORAL_TABLET | Freq: Four times a day (QID) | ORAL | 0 refills | Status: AC | PRN

## 2022-11-25 NOTE — Telephone Encounter (Signed)
      Chief Complaint: Has diverticulitis, on medications. "I'm a little better, but still in pain and having diarrhea." Symptoms: Above Frequency: Last week Pertinent Negatives: Patient denies fever or blood in stool Disposition: [] ED /[] Urgent Care (no appt availability in office) / [x] Appointment(In office/virtual)/ []  [place] [facility]/ [] Home Care/ [] Refused Recommended Disposition /[] [place] [facility]/ []  Follow-up with PCP Additional Notes: Pt. Agrees with appointment. Will go to ED for worsening of symptoms.  Reason for Disposition  [1] MILD-MODERATE pain AND [2] constant AND [3] present > 2 hours  Answer Assessment - Initial Assessment Questions 1. LOCATION: "Where does it hurt?"      Left lower 2. RADIATION: "Does the pain shoot anywhere else?" (e.g., chest, back)     No 3. ONSET: "When did the pain begin?" (Minutes, hours or days ago)      11/18/22 4. SUDDEN: "Gradual or sudden onset?"     Sudden 5. PATTERN "Does the pain come and go, or is it constant?"    - If it comes and goes: "How long does it last?" "Do you have pain now?"     (Note: Comes and goes means the pain is intermittent. It goes away completely between bouts.)    - If constant: "Is it getting better, staying the same, or getting worse?"      (Note: Constant means the pain never goes away completely; most serious pain is constant and gets worse.)      Constant 6. SEVERITY: "How bad is the pain?"  (e.g., Scale 1-10; mild, moderate, or severe)    - MILD (1-3): Doesn't interfere with normal activities, abdomen soft and not tender to touch.     - MODERATE (4-7): Interferes with normal activities or awakens from sleep, abdomen tender to touch.     - SEVERE (8-10): Excruciating pain, doubled over, unable to do any normal activities.       5-6 7. RECURRENT SYMPTOM: "Have you ever had this type of stomach pain before?" If Yes, ask: "When was the last time?" and "What happened that time?"      Yes 8.  CAUSE: "What do you think is causing the stomach pain?"     Diverticulitis  9. RELIEVING/AGGRAVATING FACTORS: "What makes it better or worse?" (e.g., antacids, bending or twisting motion, bowel movement)     Meds 10. OTHER SYMPTOMS: "Do you have any other symptoms?" (e.g., back pain, diarrhea, fever, urination pain, vomiting)       Diarrhea  Protocols used: Abdominal Pain - Male-A-AH

## 2022-11-25 NOTE — Progress Notes (Signed)
Established patient visit   Patient: Eduardo Poole   DOB: [birth year]   52 y.o. Male  MRN: [id number] Visit Date: 11/25/2022  Today's healthcare provider: Mila Merry, MD   Chief Complaint  Patient presents with   Abdominal Pain    Has not gone away and causing pain in the lower back, Happens when bowel movement has occurred    Diarrhea   Subjective    Discussed the use of AI scribe software for clinical note transcription with the patient, who gave verbal consent to proceed.  History of Present Illness   The patient, with a history of recurrent diverticulitis, reports a persistent issue with loose stools and LLQ pain despite resolution of fever for the last 6 days. He was started on flagyl and cipro 1 week ago for presumed diverticulitis. The diarrhea has not resolved and is associated with a sensation of urgency and frequency, with him reporting up to three bowel movements before 8 AM. He describes the stools as loose and notes that food seems to 'run through' them, but has had no blood in his stool.  The abdominal pain, previously localized to the left lower quadrant, has now shifted upwards and is also felt in the lower back, particularly before a bowel movement. He reports that the pain intensifies with physical jolts, such as bumps while driving.The patient has been taking Tylenol for pain management, specifically arthritis Tylenol, which he describes as 'a little bit stronger.'       Medications: Outpatient Medications Prior to Visit  Medication Sig   amLODipine (NORVASC) 5 MG tablet TAKE 1 TABLET (5 MG TOTAL) BY MOUTH DAILY.   aspirin EC 81 MG EC tablet Take 1 tablet (81 mg total) by mouth daily. (Patient taking differently: Take 81 mg by mouth at bedtime.)   atorvastatin (LIPITOR) 80 MG tablet Take 1 tablet (80 mg total) by mouth daily. Please schedule an appointment for future refills. Thank you   ciprofloxacin (CIPRO) 500 MG tablet Take 1 tablet (500 mg total) by  mouth 2 (two) times daily for 10 days.   clopidogrel (PLAVIX) 75 MG tablet TAKE 1 TABLET BY MOUTH EVERY DAY   Halobetasol Propionate (LEXETTE) 0.05 % FOAM Apply 1 application topically See admin instructions. Apply to affected areas at back of neck daily on Tuesday, Thursday, Saturdays weekly. Can also apply daily as needed for eczema at left arm see handout.   hyoscyamine (LEVSIN/SL) 0.125 MG SL tablet Place 1 tablet (0.125 mg total) under the tongue every 4 (four) hours as needed for up to 10 days.   metoprolol succinate (TOPROL-XL) 100 MG 24 hr tablet TAKE 1 TABLET BY MOUTH DAILY. TAKE WITH OR IMMEDIATELY FOLLOWING A MEAL.   metroNIDAZOLE (FLAGYL) 500 MG tablet Take 1 tablet (500 mg total) by mouth 2 (two) times daily for 10 days.   nitroGLYCERIN (NITROSTAT) 0.4 MG SL tablet For chest pain, tightness, or pressure. While sitting, place 1 tablet under tongue. May be used every 5 minutes as needed, for up to 15 minutes. Do not use more than 3 tablets.   Semaglutide-Weight Management (WEGOVY) 0.25 MG/0.5ML SOAJ Take 0.25mg  weekly for 4 weeks then increase to 0.5mg  weekly   sertraline (ZOLOFT) 100 MG tablet TAKE 2 TABLETS BY MOUTH EVERY DAY   tamsulosin (FLOMAX) 0.4 MG CAPS capsule TAKE 1 CAPSULE BY MOUTH EVERY DAY   tiZANidine (ZANAFLEX) 4 MG tablet tizanidine 4 mg tablet  TAKE 1 TABLET BY MOUTH EVERY 6 HOURS AS NEEDED  valsartan (DIOVAN) 320 MG tablet Take 1 tablet (320 mg total) by mouth daily.   No facility-administered medications prior to visit.   Review of Systems     Objective    BP (!) 150/84 (BP Location: Right Arm, Patient Position: Sitting, Cuff Size: Large)   Pulse 66   Ht 5\' 10"  (1.778 m)   Wt 280 lb 14.4 oz (127.4 kg)   SpO2 100%   BMI 40.30 kg/m    Physical Exam   ABDOMEN: Tenderness in left lower quadrant, sigmoid area. Pain around belly button. MUSCULOSKELETAL: Soreness in lower back.      Assessment & Plan        Abdominal Pain and Diarrhea Persistent  abdominal pain and diarrhea for 7 days. Fever resolved after starting antibiotics (Flagyl and Cipro) a week ago for presumed diverticulitis. Pain localized to the left lower quadrant and around the umbilicus. No blood in stool. -Collect stool samples for bacterial and infection testing. -Finish antibiotics. -Permit use of Imodium for diarrhea control. -Prescribe Vicodin 5/325 for pain management. ays.    No follow-ups on file.      Mila Merry, MD  [facility] [phone] (phone) [phone] (fax)  [facility]

## 2022-12-16 ENCOUNTER — Other Ambulatory Visit: Payer: Self-pay | Admitting: Cardiovascular Disease

## 2023-03-13 ENCOUNTER — Other Ambulatory Visit: Payer: Self-pay | Admitting: Cardiovascular Disease

## 2023-03-14 ENCOUNTER — Other Ambulatory Visit: Payer: Self-pay | Admitting: Family Medicine

## 2023-03-14 DIAGNOSIS — F32A Depression, unspecified: Secondary | ICD-10-CM

## 2023-03-15 ENCOUNTER — Other Ambulatory Visit: Payer: Self-pay | Admitting: Family Medicine

## 2023-03-15 DIAGNOSIS — Z0000 Encounter for general adult medical examination without abnormal findings: Secondary | ICD-10-CM

## 2023-03-16 ENCOUNTER — Other Ambulatory Visit: Payer: Self-pay | Admitting: Family Medicine

## 2023-03-16 DIAGNOSIS — Z0000 Encounter for general adult medical examination without abnormal findings: Secondary | ICD-10-CM

## 2023-03-16 MED ORDER — TIRZEPATIDE-WEIGHT MANAGEMENT 5 MG/0.5ML ~~LOC~~ SOLN: 5.0000 mg | SUBCUTANEOUS | 0 refills | Status: DC

## 2023-03-16 NOTE — Addendum Note (Signed)
Addended by: Malva Limes on: 03/16/2023 08:22 AM   Modules accepted: Orders

## 2023-03-18 NOTE — Telephone Encounter (Signed)
Pharmacy comment: Alternative Requested:NOT COVERED, MAYBE TRY PRIOR AUTH.

## 2023-03-20 ENCOUNTER — Other Ambulatory Visit: Payer: Self-pay | Admitting: Cardiovascular Disease

## 2023-04-06 ENCOUNTER — Other Ambulatory Visit: Payer: Self-pay | Admitting: Cardiovascular Disease

## 2023-04-10 ENCOUNTER — Other Ambulatory Visit: Payer: Self-pay | Admitting: Cardiovascular Disease

## 2023-04-13 ENCOUNTER — Other Ambulatory Visit: Payer: Self-pay | Admitting: Cardiovascular Disease

## 2023-06-09 ENCOUNTER — Other Ambulatory Visit: Payer: Self-pay | Admitting: Cardiovascular Disease

## 2023-07-08 ENCOUNTER — Other Ambulatory Visit: Payer: Self-pay | Admitting: Cardiovascular Disease

## 2023-07-11 ENCOUNTER — Other Ambulatory Visit: Payer: Self-pay | Admitting: Cardiovascular Disease

## 2023-07-15 ENCOUNTER — Other Ambulatory Visit: Payer: Self-pay

## 2023-07-15 MED ORDER — ATORVASTATIN CALCIUM 80 MG PO TABS: 80.0000 mg | ORAL_TABLET | Freq: Every day | ORAL | 0 refills | Status: DC

## 2023-07-22 ENCOUNTER — Other Ambulatory Visit: Payer: Self-pay | Admitting: Cardiovascular Disease

## 2023-08-04 ENCOUNTER — Other Ambulatory Visit: Payer: Self-pay | Admitting: Cardiovascular Disease

## 2023-08-15 ENCOUNTER — Other Ambulatory Visit: Payer: Self-pay | Admitting: Cardiovascular Disease

## 2023-08-28 NOTE — Progress Notes (Signed)
 Cardiology Office Note:    Date:  08/29/2023   ID:  Eduardo Poole, DOB [birth year], MRN [id number]  PCP:  Eduardo Nancyann BRAVO, MD   [facility] Providers Cardiologist:  Debby Sor, MD     Referring MD: Eduardo Nancyann BRAVO, MD   Chief Complaint  Patient presents with   Coronary Artery Disease    History of Present Illness:    Eduardo Poole is a 53 y.o. male is seen to establish cardiac care. He is a former patient of Dr Sor. He has a history of coronary artery disease with status post STEMI 6/16, (normal left main, normal LAD, normal circumflex, ulcerated plaque RCA, PTCA only, repeat catheterization 9/16 with normal coronaries without RCA restenosis. Had TIA at the time with slurred speech and left arm numbness/weakness. History of Diastolic dysfunction, DVT 2009, GERD, HLD, HTN, TIA. He has OSA on CPAP. Seen in 2023 with atypical chest pain in setting of Covid. Echocardiogram 12/24/2021 showed normal EF 55-60%, intermediate diastolic parameters and no significant valvular abnormalities.   On  follow up today he is doing well from a CV standpoint. No chest pain or dyspnea. Compliant with medications using CPAP. Had a bad case of diverticulitis a couple of months ago has bad arthritis in his knees and cervical spine. Does desk work. Fairly inactive. Does note his BP consistently high.  Past Medical History:  Diagnosis Date   CAD (coronary artery disease)    a. 07/2014 Inf STEMI/PTCA:  LM nl, LAD nl, LCX nl, RCA ulcerated plaque (PTCA only);  b. 10/2014 MV: intermediate risk, ? inflat isch/scar;  c. 10/2014 Cath: Nl cors w/o RCA restenosis.   Diastolic dysfunction    a. 07/2014 Echo: EF 60-65%, Gr1 DD.   DVT of lower extremity (deep venous thrombosis) (HCC)    a. ~ 2009, per pt was on lovenox/coumadin   GERD (gastroesophageal reflux disease)    History of dysplastic nevus 12/07/2018   right post waistline/moderate, left low back/moderate, left mid back lateral near  side/moderate    Hyperlipidemia    Hypertension    Hypertensive heart disease    ST elevation myocardial infarction (STEMI) involving right coronary artery with complication (HCC) 07/23/2014   TIA (transient ischemic attack) 07/23/2014    Past Surgical History:  Procedure Laterality Date   ARTHROPLASTY Right 07/09/2010   CARDIAC CATHETERIZATION N/A 07/23/2014   Procedure: Left Heart Cath;  Surgeon: Debby DELENA Sor, MD;  Location: [facility] INVASIVE CV LAB;  Service: Cardiovascular;  Laterality: N/A;   CARDIAC CATHETERIZATION N/A 07/23/2014   Procedure: Coronary Balloon Angioplasty;  Surgeon: Debby DELENA Sor, MD;  Location: [facility] INVASIVE CV LAB;  Service: Cardiovascular;  Laterality: N/A;   CARDIAC CATHETERIZATION N/A 11/10/2014   Procedure: Left Heart Cath and Coronary Angiography;  Surgeon: Debby DELENA Sor, MD;  Location: [facility] INVASIVE CV LAB;  Service: Cardiovascular;  Laterality: N/A;   CERVICAL DISC SURGERY  2015   COLONOSCOPY     COLONOSCOPY     COLONOSCOPY WITH PROPOFOL  N/A 11/23/2020   Procedure: COLONOSCOPY WITH PROPOFOL ;  Surgeon: Janalyn Keene NOVAK, MD;  Location: [facility] ENDOSCOPY;  Service: Endoscopy;  Laterality: N/A;   JOINT REPLACEMENT Right    Right knee   MENISCUS REPAIR Right 11/03/1995   Meniscus Lateral Tear: repaired by Dr. Cleotilde at [facility]   NASAL SINUS SURGERY     Deviated Septum   REPLACEMENT TOTAL KNEE Right 2011   [facility]    Current Medications: Current Meds  Medication Sig   spironolactone  (ALDACTONE ) 25 MG tablet Take 1 tablet (25 mg total) by mouth daily.     Allergies:   Patient has no known allergies.   Social History   Socioeconomic History   Marital status: Married    Spouse name: Not on file   Number of children: 2   Years of education: 12   Highest education level: Some college, no degree  Occupational History   Occupation: Employed    Comment: Works at [employer]  Tobacco Use   Smoking status: Never   Smokeless  tobacco: Former    Types: Engineer, drilling   Vaping status: Never Used  Substance and Sexual Activity   Alcohol use: Yes    Alcohol/week: 3.0 standard drinks of alcohol    Types: 2 Cans of beer, 1 Standard drinks or equivalent per week    Comment: none last 24 hrs   Drug use: No   Sexual activity: Yes  Other Topics Concern   Not on file  Social History Narrative   Not on file   Social Drivers of Health   Financial Resource Strain: Low Risk  (11/25/2022)   Overall Financial Resource Strain (CARDIA)    Difficulty of Paying Living Expenses: Not very hard  Food Insecurity: No Food Insecurity (11/25/2022)   Hunger Vital Sign    Worried About Running Out of Food in the Last Year: Never true    Ran Out of Food in the Last Year: Never true  Transportation Needs: No Transportation Needs (11/25/2022)   PRAPARE - Administrator, Civil Service (Medical): No    Lack of Transportation (Non-Medical): No  Physical Activity: Unknown (11/25/2022)   Exercise Vital Sign    Days of Exercise per Week: 0 days    Minutes of Exercise per Session: Not on file  Stress: Stress Concern Present (11/25/2022)   [employer] of Occupational Health - Occupational Stress Questionnaire    Feeling of Stress : To some extent  Social Connections: Socially Isolated (11/25/2022)   Social Connection and Isolation Panel    Frequency of Communication with Friends and Family: Twice a week    Frequency of Social Gatherings with Friends and Family: Never    Attends Religious Services: Never    Database administrator or Organizations: No    Attends Engineer, structural: Not on file    Marital Status: Married     Family History: The patient's family history includes Arthritis in his mother and sister; Autoimmune disease in his father; Cancer in his maternal grandfather; Diabetes in his maternal grandfather and paternal grandfather; Diverticulitis in his mother; Gout in his father; Hypertension in  his mother.  ROS:   Please see the history of present illness.     All other systems reviewed and are negative.  EKGs/Labs/Other Studies Reviewed:    The following studies were reviewed today: EKG Interpretation Date/Time:  Friday August 29 2023 13:54:10 EDT Ventricular Rate:  58 PR Interval:  180 QRS Duration:  100 QT Interval:  404 QTC Calculation: 396 R Axis:   -30  Text Interpretation: Sinus bradycardia Left axis deviation When compared with ECG of 14-Dec-2021 09:28, no PVCs seen Confirmed by [place], Shaquoya Cosper [phone]) on 08/29/2023 1:55:29 PM   EKG Interpretation Date/Time:  Friday August 29 2023 13:54:10 EDT Ventricular Rate:  58 PR Interval:  180 QRS Duration:  100 QT Interval:  404 QTC Calculation: 396 R Axis:   -30  Text Interpretation: Sinus  bradycardia Left axis deviation When compared with ECG of 14-Dec-2021 09:28, no PVCs seen Confirmed by [place], Zahraa Bhargava [phone]) on 08/29/2023 1:55:29 PM    Recent Labs: 11/05/2022: ALT 29; BUN 15; Creatinine, Ser 0.90; Hemoglobin 14.4; Platelets 294; Potassium 4.7; Sodium 139  Recent Lipid Panel    Component Value Date/Time   CHOL 120 11/05/2022 0945   TRIG 115 11/05/2022 0945   HDL 32 (L) 11/05/2022 0945   CHOLHDL 3.8 11/05/2022 0945   CHOLHDL 3.9 02/06/2016 1058   VLDL 30 02/06/2016 1058   LDLCALC 67 11/05/2022 0945     Risk Assessment/Calculations:       Physical Exam:    VS:  BP (!) 156/88   Pulse 69   Ht 5' 10 (1.778 m)   Wt 291 lb 4 oz (132.1 kg)   SpO2 96%   BMI 41.79 kg/m     Wt Readings from Last 3 Encounters:  08/29/23 291 lb 4 oz (132.1 kg)  11/25/22 280 lb 14.4 oz (127.4 kg)  11/05/22 284 lb (128.8 kg)     GEN:  Well nourished, obese in no acute distress HEENT: Normal NECK: No JVD; No carotid bruits LYMPHATICS: No lymphadenopathy CARDIAC: RRR, no murmurs, rubs, gallops RESPIRATORY:  Clear to auscultation without rales, wheezing or rhonchi  ABDOMEN: Soft, non-tender, non-distended MUSCULOSKELETAL:   No edema; No deformity  SKIN: Warm and dry NEUROLOGIC:  Alert and oriented x 3 PSYCHIATRIC:  Normal affect   ASSESSMENT:    1. Coronary artery disease involving native coronary artery of native heart without angina pectoris   2. Palpitations   3. Frequent PVCs   4. Essential hypertension   5. Hyperlipidemia LDL goal <70   6. OSA (obstructive sleep apnea)    PLAN:    In order of problems listed above:  CAD remote in 2016 with ulcerated/thrombotic plaque in RCA treated with POBA. No recurrent angina. Has been on chronic DAPT due to history of TIA at time of MI. Continue amlodipine , Toprol , statin HTN- BP poorly controlled on high dose amlodipine , valsartan  and Toprol . Will add aldactone  25 mg daily. Check BMET in 1-2 weeks. Follow up with PCP in 3 months HLD last LDL 67 on statin.  PVCs- not symptomatic.  OSA on CPAP History of TIA        Follow up in one year   Medication Adjustments/Labs and Tests Ordered: Current medicines are reviewed at length with the patient today.  Concerns regarding medicines are outlined above.  Orders Placed This Encounter  Procedures   EKG 12-Lead   Meds ordered this encounter  Medications   spironolactone  (ALDACTONE ) 25 MG tablet    Sig: Take 1 tablet (25 mg total) by mouth daily.    Dispense:  90 tablet    Refill:  3    There are no Patient Instructions on file for this visit.   Signed, Zyan Coby [place], MD  08/29/2023 2:05 PM    [facility]

## 2023-08-29 ENCOUNTER — Ambulatory Visit: Admitting: Cardiology

## 2023-08-29 ENCOUNTER — Ambulatory Visit: Attending: Cardiology | Admitting: Cardiology

## 2023-08-29 ENCOUNTER — Encounter: Payer: Self-pay | Admitting: Cardiology

## 2023-08-29 VITALS — BP 156/88 | HR 69 | Ht 70.0 in | Wt 291.2 lb

## 2023-08-29 DIAGNOSIS — I10 Essential (primary) hypertension: Secondary | ICD-10-CM

## 2023-08-29 DIAGNOSIS — I493 Ventricular premature depolarization: Secondary | ICD-10-CM

## 2023-08-29 DIAGNOSIS — R002 Palpitations: Secondary | ICD-10-CM

## 2023-08-29 DIAGNOSIS — I2510 Atherosclerotic heart disease of native coronary artery without angina pectoris: Secondary | ICD-10-CM

## 2023-08-29 DIAGNOSIS — G4733 Obstructive sleep apnea (adult) (pediatric): Secondary | ICD-10-CM

## 2023-08-29 DIAGNOSIS — E785 Hyperlipidemia, unspecified: Secondary | ICD-10-CM

## 2023-08-29 MED ORDER — CLOPIDOGREL BISULFATE 75 MG PO TABS: 75.0000 mg | ORAL_TABLET | Freq: Every day | ORAL | 3 refills | Status: AC

## 2023-08-29 MED ORDER — METOPROLOL SUCCINATE ER 100 MG PO TB24
100.0000 mg | ORAL_TABLET | Freq: Every day | ORAL | 3 refills | Status: AC
Start: 2023-08-29 — End: ?

## 2023-08-29 MED ORDER — AMLODIPINE BESYLATE 5 MG PO TABS: 5.0000 mg | ORAL_TABLET | Freq: Every day | ORAL | 3 refills | Status: AC

## 2023-08-29 MED ORDER — ATORVASTATIN CALCIUM 80 MG PO TABS
80.0000 mg | ORAL_TABLET | Freq: Every day | ORAL | 3 refills | Status: AC
Start: 2023-08-29 — End: ?

## 2023-08-29 MED ORDER — VALSARTAN 320 MG PO TABS: 320.0000 mg | ORAL_TABLET | Freq: Every day | ORAL | 3 refills | Status: AC

## 2023-08-29 MED ORDER — NITROGLYCERIN 0.4 MG SL SUBL: SUBLINGUAL_TABLET | SUBLINGUAL | 12 refills | Status: AC

## 2023-08-29 MED ORDER — SPIRONOLACTONE 25 MG PO TABS: 25.0000 mg | ORAL_TABLET | Freq: Every day | ORAL | 3 refills | Status: AC

## 2023-08-29 NOTE — Addendum Note (Signed)
 Addended by: CHRISTIANNE CHANNING PARAS on: 08/29/2023 02:21 PM   Modules accepted: Orders

## 2023-08-29 NOTE — Patient Instructions (Signed)
 Medication Instructions:  Start Aldactone  25 mg daily Continue all other medications *If you need a refill on your cardiac medications before your next appointment, please call your pharmacy*  Lab Work: Bmet in 2 weeks  Testing/Procedures: None ordered  Follow-Up: At [facility], you and your health needs are our priority.  As part of our continuing mission to provide you with exceptional heart care, our providers are all part of one team.  This team includes your primary Cardiologist (physician) and Advanced Practice Providers or APPs (Physician Assistants and Nurse Practitioners) who all work together to provide you with the care you need, when you need it.  Your next appointment:  1 year   Call in March to schedule July appointment    Provider:  Dr.Jordan   We recommend signing up for the patient portal called MyChart.  Sign up information is provided on this After Visit Summary.  MyChart is used to connect with patients for Virtual Visits (Telemedicine).  Patients are able to view lab/test results, encounter notes, upcoming appointments, etc.  Non-urgent messages can be sent to your provider as well.   To learn more about what you can do with MyChart, go to [email or web address].

## 2023-09-15 DIAGNOSIS — I2510 Atherosclerotic heart disease of native coronary artery without angina pectoris: Secondary | ICD-10-CM | POA: Diagnosis not present

## 2023-09-15 DIAGNOSIS — I10 Essential (primary) hypertension: Secondary | ICD-10-CM | POA: Diagnosis not present

## 2023-09-15 DIAGNOSIS — I493 Ventricular premature depolarization: Secondary | ICD-10-CM | POA: Diagnosis not present

## 2023-09-15 DIAGNOSIS — R002 Palpitations: Secondary | ICD-10-CM | POA: Diagnosis not present

## 2023-09-15 LAB — BASIC METABOLIC PANEL WITH GFR
BUN/Creatinine Ratio: 23 — ABNORMAL HIGH (ref 9–20)
BUN: 23 mg/dL (ref 6–24)
CO2: 19 mmol/L — ABNORMAL LOW (ref 20–29)
Calcium: 9.3 mg/dL (ref 8.7–10.2)
Chloride: 105 mmol/L (ref 96–106)
Creatinine, Ser: 1.01 mg/dL (ref 0.76–1.27)
Glucose: 102 mg/dL — ABNORMAL HIGH (ref 70–99)
Potassium: 4.9 mmol/L (ref 3.5–5.2)
Sodium: 141 mmol/L (ref 134–144)
eGFR: 89 mL/min/1.73 (ref >59)

## 2023-09-16 ENCOUNTER — Ambulatory Visit: Payer: Self-pay | Admitting: Cardiology

## 2023-10-15 ENCOUNTER — Ambulatory Visit: Admitting: Cardiology

## 2023-11-07 ENCOUNTER — Encounter: Payer: Self-pay | Admitting: Family Medicine

## 2023-11-07 ENCOUNTER — Ambulatory Visit: Admitting: Family Medicine

## 2023-11-07 VITALS — BP 127/77 | HR 54 | Ht 70.0 in | Wt 282.6 lb

## 2023-11-07 DIAGNOSIS — J019 Acute sinusitis, unspecified: Secondary | ICD-10-CM | POA: Diagnosis not present

## 2023-11-07 DIAGNOSIS — Z125 Encounter for screening for malignant neoplasm of prostate: Secondary | ICD-10-CM | POA: Diagnosis not present

## 2023-11-07 DIAGNOSIS — Z0001 Encounter for general adult medical examination with abnormal findings: Secondary | ICD-10-CM

## 2023-11-07 DIAGNOSIS — I10 Essential (primary) hypertension: Secondary | ICD-10-CM

## 2023-11-07 DIAGNOSIS — G4733 Obstructive sleep apnea (adult) (pediatric): Secondary | ICD-10-CM

## 2023-11-07 DIAGNOSIS — Z0000 Encounter for general adult medical examination without abnormal findings: Secondary | ICD-10-CM

## 2023-11-07 DIAGNOSIS — E7800 Pure hypercholesterolemia, unspecified: Secondary | ICD-10-CM

## 2023-11-07 DIAGNOSIS — Z8673 Personal history of transient ischemic attack (TIA), and cerebral infarction without residual deficits: Secondary | ICD-10-CM

## 2023-11-07 DIAGNOSIS — Z23 Encounter for immunization: Secondary | ICD-10-CM

## 2023-11-07 DIAGNOSIS — Z8719 Personal history of other diseases of the digestive system: Secondary | ICD-10-CM | POA: Insufficient documentation

## 2023-11-07 DIAGNOSIS — I119 Hypertensive heart disease without heart failure: Secondary | ICD-10-CM | POA: Diagnosis not present

## 2023-11-07 DIAGNOSIS — I25119 Atherosclerotic heart disease of native coronary artery with unspecified angina pectoris: Secondary | ICD-10-CM | POA: Diagnosis not present

## 2023-11-07 DIAGNOSIS — J301 Allergic rhinitis due to pollen: Secondary | ICD-10-CM

## 2023-11-07 DIAGNOSIS — K760 Fatty (change of) liver, not elsewhere classified: Secondary | ICD-10-CM

## 2023-11-07 DIAGNOSIS — Z860101 Personal history of adenomatous and serrated colon polyps: Secondary | ICD-10-CM

## 2023-11-07 MED ORDER — AMOXICILLIN 500 MG PO CAPS: 1000.0000 mg | ORAL_CAPSULE | Freq: Three times a day (TID) | ORAL | 0 refills | Status: AC

## 2023-11-07 NOTE — Progress Notes (Signed)
 Complete physical exam   Patient: Eduardo Poole   DOB: 1971/02/16   53 y.o. Male  MRN: [id number] Visit Date: 11/07/2023  Today's healthcare provider: Nancyann Perry, MD   Chief Complaint  Patient presents with   Annual Exam    Last completed 11/05/22 Diet -  No additional salt  Exercise - very little due to joint and neck problems Feeling - well Sleeping - well Concerns - possible sinus infection   Nasal Congestion    Patient reports nasal pressure, greenish yellow-discharge when blowing nose, as well as teeth hurt X more than one month. Reports he will take otc medications, get better and then symptoms return.    Subjective    Discussed the use of AI scribe software for clinical note transcription with the patient, who gave verbal consent to proceed.  History of Present Illness   Eduardo Poole is a 53 year old male who presents for a routine annual physical exam.  He has ongoing sinus issues, experiencing intermittent congestion. Recently, he had a severe episode with dental pain and frequent sneezing, which he attributes to dryness. Over-the-counter nasal sprays and allergy medications have provided limited relief. His allergies have been particularly bad recently, possibly due to heavy grass pollens.  His blood pressure medication was recently changed to spironolactone  due to high readings during his last cardiology visit. He is no longer taking a weight loss drug due to insurance coverage issues.  He has a history of sleep apnea and has been using the same CPAP machine since age 85. The machine is showing signs of wear. The CPAP machine helps with his snoring and congestion, especially when he falls asleep at his desk.  He recalls a severe episode of diverticulitis, experiencing a high fever and chills, which prevented him from seeking immediate medical attention. He is concerned due to his father's history of a ruptured diverticulum leading to a serious  infection.  He had a colonoscopy in 2022 where polyps were removed, and he is due for a follow-up this year. He has had two previous colonoscopies where polyps were found both times. No recent stomach problems, cramping, or changes in bowel habits.  No recent eye examination in the last two years but reports frequent eye rubbing.         Past Medical History:  Diagnosis Date   Anxiety    Arthritis    CAD (coronary artery disease)    a. 07/2014 Inf STEMI/PTCA:  LM nl, LAD nl, LCX nl, RCA ulcerated plaque (PTCA only);  b. 10/2014 MV: intermediate risk, ? inflat isch/scar;  c. 10/2014 Cath: Nl cors w/o RCA restenosis.   Depression 8years   Loss of nephew   Diastolic dysfunction    a. 07/2014 Echo: EF 60-65%, Gr1 DD.   DVT of lower extremity (deep venous thrombosis) (HCC)    a. ~ 2009, per pt was on lovenox/coumadin   GERD (gastroesophageal reflux disease)    History of dysplastic nevus 12/07/2018   right post waistline/moderate, left low back/moderate, left mid back lateral near side/moderate    Hyperlipidemia    Hypertension    Hypertensive heart disease    Polyp of colon    Sleep apnea    ST elevation myocardial infarction (STEMI) involving right coronary artery with complication (HCC) 07/23/2014   Stroke (HCC)    TIA (transient ischemic attack) 07/23/2014   Past Surgical History:  Procedure Laterality Date   ARTHROPLASTY Right 07/09/2010   CARDIAC CATHETERIZATION N/A  07/23/2014   Procedure: Left Heart Cath;  Surgeon: Debby DELENA Sor, MD;  Location: [facility] INVASIVE CV LAB;  Service: Cardiovascular;  Laterality: N/A;   CARDIAC CATHETERIZATION N/A 07/23/2014   Procedure: Coronary Balloon Angioplasty;  Surgeon: Debby DELENA Sor, MD;  Location: [facility] INVASIVE CV LAB;  Service: Cardiovascular;  Laterality: N/A;   CARDIAC CATHETERIZATION N/A 11/10/2014   Procedure: Left Heart Cath and Coronary Angiography;  Surgeon: Debby DELENA Sor, MD;  Location: [facility] INVASIVE CV LAB;  Service: Cardiovascular;   Laterality: N/A;   CERVICAL DISC SURGERY  2015   COLONOSCOPY     COLONOSCOPY     COLONOSCOPY WITH PROPOFOL  N/A 11/23/2020   Procedure: COLONOSCOPY WITH PROPOFOL ;  Surgeon: Janalyn Keene NOVAK, MD;  Location: [facility] ENDOSCOPY;  Service: Endoscopy;  Laterality: N/A;   JOINT REPLACEMENT Right    Right knee   MENISCUS REPAIR Right 11/03/1995   Meniscus Lateral Tear: repaired by Dr. Cleotilde at [facility]   NASAL SINUS SURGERY     Deviated Septum   REPLACEMENT TOTAL KNEE Right 2011   [facility]   SPINE SURGERY     Social History   Socioeconomic History   Marital status: Married    Spouse name: Not on file   Number of children: 2   Years of education: 12   Highest education level: Some college, no degree  Occupational History   Occupation: Employed    Comment: Works at [employer]  Tobacco Use   Smoking status: Never   Smokeless tobacco: Former    Types: Associate Professor status: Never Used  Substance and Sexual Activity   Alcohol use: Not Currently    Alcohol/week: 3.0 standard drinks of alcohol    Comment: none last 24 hrs   Drug use: No   Sexual activity: Yes  Other Topics Concern   Not on file  Social History Narrative   Not on file   Social Drivers of Health   Financial Resource Strain: Low Risk  (11/25/2022)   Overall Financial Resource Strain (CARDIA)    Difficulty of Paying Living Expenses: Not very hard  Food Insecurity: No Food Insecurity (11/25/2022)   Hunger Vital Sign    Worried About Running Out of Food in the Last Year: Never true    Ran Out of Food in the Last Year: Never true  Transportation Needs: No Transportation Needs (11/25/2022)   PRAPARE - Administrator, Civil Service (Medical): No    Lack of Transportation (Non-Medical): No  Physical Activity: Unknown (11/25/2022)   Exercise Vital Sign    Days of Exercise per Week: 0 days    Minutes of Exercise per Session: Not on file  Stress: Stress Concern  Present (11/25/2022)   [employer] of Occupational Health - Occupational Stress Questionnaire    Feeling of Stress : To some extent  Social Connections: Socially Isolated (11/25/2022)   Social Connection and Isolation Panel    Frequency of Communication with Friends and Family: Twice a week    Frequency of Social Gatherings with Friends and Family: Never    Attends Religious Services: Never    Database administrator or Organizations: No    Attends Engineer, structural: Not on file    Marital Status: Married  Catering manager Violence: Not At Risk (11/02/2021)   Humiliation, Afraid, Rape, and Kick questionnaire    Fear of Current or Ex-Partner: No    Emotionally Abused: No    Physically  Abused: No    Sexually Abused: No   Family Status  Relation Name Status   Mother Bolden Hagerman Alive   Father  Alive   Sister Shona Burkes Alive   PGF Georgette Marina (Not Specified)   MGF Zachary Riis Deceased  No partnership data on file   Family History  Problem Relation Age of Onset   Hypertension Mother    Arthritis Mother    Diverticulitis Mother    Gout Father    Autoimmune disease Father    Arthritis Sister    Diabetes Paternal Grandfather    Diabetes Maternal Grandfather    Cancer Maternal Grandfather        unknown   No Known Allergies  Patient Care Team: [name], MD as PCP - General (Family Medicine) Burnard Debby LABOR, MD (Inactive) as PCP - Cardiology (Cardiology) Charleen Hamilton, MD as Referring Physician (Orthopedic Surgery) Herminio Miu, MD (Unknown Physician Specialty) Burnard Debby LABOR, MD (Inactive) as Consulting Physician (Cardiology) [place], Peter M, MD as Consulting Physician (Cardiology)   Medications: Outpatient Medications Prior to Visit  Medication Sig   amLODipine  (NORVASC ) 5 MG tablet Take 1 tablet (5 mg total) by mouth daily.   aspirin  EC 81 MG EC tablet Take 1 tablet (81 mg total) by mouth daily. (Patient taking differently:  Take 81 mg by mouth at bedtime.)   atorvastatin  (LIPITOR ) 80 MG tablet Take 1 tablet (80 mg total) by mouth daily.   clopidogrel  (PLAVIX ) 75 MG tablet Take 1 tablet (75 mg total) by mouth daily.   Halobetasol  Propionate (LEXETTE ) 0.05 % FOAM Apply 1 application topically See admin instructions. Apply to affected areas at back of neck daily on Tuesday, Thursday, Saturdays weekly. Can also apply daily as needed for eczema at left arm see handout.   metoprolol  succinate (TOPROL -XL) 100 MG 24 hr tablet Take 1 tablet (100 mg total) by mouth daily. Take with or immediately following a meal.   nitroGLYCERIN  (NITROSTAT ) 0.4 MG SL tablet For chest pain, tightness, or pressure. While sitting, place 1 tablet under tongue. May be used every 5 minutes as needed, for up to 15 minutes. Do not use more than 3 tablets.   sertraline  (ZOLOFT ) 100 MG tablet TAKE 2 TABLETS BY MOUTH EVERY DAY   spironolactone  (ALDACTONE ) 25 MG tablet Take 1 tablet (25 mg total) by mouth daily.   tiZANidine (ZANAFLEX) 4 MG tablet tizanidine 4 mg tablet  TAKE 1 TABLET BY MOUTH EVERY 6 HOURS AS NEEDED   valsartan  (DIOVAN ) 320 MG tablet Take 1 tablet (320 mg total) by mouth daily.   tirzepatide  5 MG/0.5ML injection vial Inject 5 mg into the skin once a week. (Patient not taking: Reported on 11/07/2023)   No facility-administered medications prior to visit.    Review of Systems  Constitutional:  Negative for appetite change, chills and fever.  Respiratory:  Negative for chest tightness, shortness of breath and wheezing.   Cardiovascular:  Negative for chest pain and palpitations.  Gastrointestinal:  Negative for abdominal pain, nausea and vomiting.      Objective    BP 127/77 (BP Location: Left Arm, Patient Position: Sitting, Cuff Size: Large)   Pulse (!) 54   Ht 5' 10 (1.778 m)   Wt 282 lb 9.6 oz (128.2 kg)   SpO2 96%   BMI 40.55 kg/m    Physical Exam  General Appearance:    Severely obese male. Alert, cooperative, in no  acute distress, appears stated age  Head:  Normocephalic, without obvious abnormality, atraumatic  Eyes:    PERRL, conjunctiva/corneas clear, EOM's intact, fundi    benign, both eyes       Ears:    Normal TM's and external ear canals, both ears  Nose:   Pale congested turbinates, tender frontal and maxillary sinuses.   Throat:   Lips, mucosa, and tongue normal; teeth and gums normal  Neck:   Supple, symmetrical, trachea midline, no adenopathy;       thyroid :  No enlargement/tenderness/nodules; no carotid   bruit or JVD  Back:     Symmetric, no curvature, ROM normal, no CVA tenderness  Lungs:     Clear to auscultation bilaterally, respirations unlabored  Chest wall:    No tenderness or deformity  Heart:    Bradycardic. Normal rhythm. No murmurs, rubs, or gallops.  S1 and S2 normal  Abdomen:     Soft, non-tender, bowel sounds active all four quadrants,    no masses, no organomegaly  Genitalia:    deferred  Rectal:    deferred  Extremities:   All extremities are intact. No cyanosis or edema  Pulses:   2+ and symmetric all extremities  Skin:   Skin color, texture, turgor normal, no rashes or lesions  Lymph nodes:   Cervical, supraclavicular, and axillary nodes normal  Neurologic:   CNII-XII intact. Normal strength, sensation and reflexes      throughout       Last depression screening scores    11/05/2022    8:40 AM 04/19/2022    4:31 PM 10/30/2021    2:20 PM  PHQ 2/9 Scores  PHQ - 2 Score 2 3 5   PHQ- 9 Score 11 11 16    Last fall risk screening    04/19/2022    4:31 PM  Fall Risk   Falls in the past year? 0  Number falls in past yr: 0  Injury with Fall? 0   Last Audit-C alcohol use screening    11/25/2022    9:45 AM  Alcohol Use Disorder Test (AUDIT)  1. How often do you have a drink containing alcohol? 1  2. How many drinks containing alcohol do you have on a typical day when you are drinking? 0  3. How often do you have six or more drinks on one occasion? 0  AUDIT-C  Score 1      Patient-reported   A score of 3 or more in women, and 4 or more in men indicates increased risk for alcohol abuse, EXCEPT if all of the points are from question 1   No results found for any visits on 11/07/23.  Assessment & Plan    Routine Health Maintenance and Physical Exam  Exercise Activities and Dietary recommendations  Goals   None     Immunization History  Administered Date(s) Administered   Influenza, Seasonal, Injecte, Preservative Fre 11/05/2022, 11/07/2023   Influenza,inj,Quad PF,6+ Mos 12/21/2012, 12/02/2014, 04/23/2019, 10/12/2019, 10/25/2020, 10/30/2021   Influenza-Unspecified 12/19/2017   Moderna Sars-Covid-2 Vaccination 07/12/2019, 08/06/2019   PNEUMOCOCCAL CONJUGATE-20 11/07/2023   Td 06/19/2010   Td (Adult),unspecified 06/19/2010   Tdap 10/12/2019   Zoster Recombinant(Shingrix ) 11/05/2022, 11/07/2023    Health Maintenance  Topic Date Due   HIV Screening  Never done   Hepatitis B Vaccines 19-59 Average Risk (1 of 3 - 19+ 3-dose series) Never done   COVID-19 Vaccine (3 - Moderna risk series) 09/03/2019   Colonoscopy  11/24/2023   DTaP/Tdap/Td (4 - Td or Tdap) 10/11/2029   Pneumococcal  Vaccine: 50+ Years  Completed   Influenza Vaccine  Completed   Hepatitis C Screening  Completed   Zoster Vaccines- Shingrix   Completed   HPV VACCINES  Aged Out   Meningococcal B Vaccine  Aged Out    Discussed health benefits of physical activity, and encouraged him to engage in regular exercise appropriate for his age and condition.      2. Prostate cancer screening  - PSA Total (Reflex To Free)  3. Immunization due  - Varicella-zoster vaccine IM - Pneumococcal conjugate vaccine 20-valent - Flu vaccine trivalent PF, 6mos and older(Flulaval,Afluria,Fluarix,Fluzone)  4. Acute sinusitis, recurrence not specified, unspecified location Likely complication of allergies as below.   - amoxicillin  (AMOXIL ) 500 MG capsule; Take 2 capsules (1,000 mg total)  by mouth 3 (three) times daily for 5 days.  Dispense: 30 capsule; Refill: 0  5. Seasonal allergic rhinitis due to pollen Continue OTC allergy nasal sprays.   6. Hypertensive heart disease without heart failure BP well controlled.   7. Coronary artery disease involving native coronary artery of native heart with angina pectoris (HCC) Asymptomatic. Compliant with medication.  Continue aggressive risk factor modification.  No chest pain on current betablocker. Not having to use nitroglycerine. Continue regular follow up with cardiology.   Was prescribed GLP-1 early this year but was cost prohibitive. He would medically benefit from GLP-1 due to underlying CAD. Will try to get approved with better coverage after new year.   8. Primary hypertension Continue current medications.    9. History of CVA in adulthood Asymptomatic. Compliant with medication.  Continue aggressive risk factor modification.    10. Pure hypercholesterolemia He is tolerating atorvastatin  well with no adverse effects.   - CBC - Comprehensive metabolic panel with GFR - Lipid panel - TSH  11. OSA on CPAP He current CPAP machine is nearly 10 years and not working consistently. He does notice significant improvement when it is functioning. He is need of new CPAP machine to autotitrate 4-20cm H2O. Rx sent to [facility].    12. Morbid obesity due to excess calories (HCC) Would medically from GLP-1, will try to get covered with change in insurance benefits after the new year.   13. Hepatic steatosis  Fibrosis 4 Score = .87 (Low risk)         Interpretation for patients with HCV          <1.45       -  F0-F1 (Low risk)          1.45-3.25 -  Indeterminate           >3.25      -  F3-F4 (High risk)     Validated for ages 19-65    1. H/O adenomatous polyp of colon  - Ambulatory referral to Gastroenterology  15. History of diverticulitis Does well with antibiotic combo during flare ups which have kept him out of  the ER with most recent flare.       Return in about 1 year (around 11/06/2024) for Yearly Physical.        Nancyann Perry, MD  [facility] [phone] (phone) [phone] (fax)  [facility]

## 2023-11-07 NOTE — Patient Instructions (Signed)
 Please review the attached list of medications and notify my office if there are any errors.   Please contact your eyecare professional to schedule a routine eye exam   I recommend getting the updated Covid vaccine at your local pharmacy

## 2023-11-08 LAB — LIPID PANEL
Chol/HDL Ratio: 4.5 ratio (ref 0.0–5.0)
Cholesterol, Total: 125 mg/dL (ref 100–199)
HDL: 28 mg/dL — ABNORMAL LOW (ref >39)
LDL Chol Calc (NIH): 68 mg/dL (ref 0–99)
Triglycerides: 169 mg/dL — ABNORMAL HIGH (ref 0–149)
VLDL Cholesterol Cal: 29 mg/dL (ref 5–40)

## 2023-11-08 LAB — COMPREHENSIVE METABOLIC PANEL WITH GFR
ALT: 45 IU/L — ABNORMAL HIGH (ref 0–44)
AST: 33 IU/L (ref 0–40)
Albumin: 4.3 g/dL (ref 3.8–4.9)
Alkaline Phosphatase: 78 IU/L (ref 47–123)
BUN/Creatinine Ratio: 19 (ref 9–20)
BUN: 21 mg/dL (ref 6–24)
Bilirubin Total: 0.4 mg/dL (ref 0.0–1.2)
CO2: 20 mmol/L (ref 20–29)
Calcium: 9.7 mg/dL (ref 8.7–10.2)
Chloride: 103 mmol/L (ref 96–106)
Creatinine, Ser: 1.08 mg/dL (ref 0.76–1.27)
Globulin, Total: 3 g/dL (ref 1.5–4.5)
Glucose: 96 mg/dL (ref 70–99)
Potassium: 4.6 mmol/L (ref 3.5–5.2)
Sodium: 140 mmol/L (ref 134–144)
Total Protein: 7.3 g/dL (ref 6.0–8.5)
eGFR: 82 mL/min/1.73 (ref >59)

## 2023-11-08 LAB — CBC
Hematocrit: 45.3 % (ref 37.5–51.0)
Hemoglobin: 14.3 g/dL (ref 13.0–17.7)
MCH: 30.2 pg (ref 26.6–33.0)
MCHC: 31.6 g/dL (ref 31.5–35.7)
MCV: 96 fL (ref 79–97)
Platelets: 314 x10E3/uL (ref 150–450)
RBC: 4.74 x10E6/uL (ref 4.14–5.80)
RDW: 13.1 % (ref 11.6–15.4)
WBC: 9 x10E3/uL (ref 3.4–10.8)

## 2023-11-08 LAB — PSA TOTAL (REFLEX TO FREE): Prostate Specific Ag, Serum: 1.5 ng/mL (ref 0.0–4.0)

## 2023-11-08 LAB — TSH: TSH: 2.8 u[IU]/mL (ref 0.450–4.500)

## 2023-11-10 ENCOUNTER — Ambulatory Visit: Payer: Self-pay | Admitting: Family Medicine

## 2023-11-25 ENCOUNTER — Encounter: Payer: Self-pay | Admitting: Family Medicine

## 2023-12-03 NOTE — Telephone Encounter (Signed)
 Signed order left at my workstation

## 2024-02-19 ENCOUNTER — Other Ambulatory Visit: Payer: Self-pay | Admitting: Family Medicine

## 2024-02-19 DIAGNOSIS — F419 Anxiety disorder, unspecified: Secondary | ICD-10-CM

## 2024-03-02 ENCOUNTER — Other Ambulatory Visit: Payer: Self-pay | Admitting: Family Medicine

## 2024-03-02 DIAGNOSIS — G4733 Obstructive sleep apnea (adult) (pediatric): Secondary | ICD-10-CM

## 2024-03-02 MED ORDER — ZEPBOUND 2.5 MG/0.5ML ~~LOC~~ SOAJ: 2.5000 mg | SUBCUTANEOUS | 0 refills | Status: AC
